# Patient Record
Sex: Female | Born: 1990 | Race: White | Hispanic: No | Marital: Married | Smoking: Former smoker
Health system: Southern US, Community
[De-identification: ages and names within clinical notes are randomized; demographics above are authoritative.]

## PROBLEM LIST (undated history)

## (undated) ENCOUNTER — Inpatient Hospital Stay (HOSPITAL_COMMUNITY): Payer: Self-pay

## (undated) DIAGNOSIS — K259 Gastric ulcer, unspecified as acute or chronic, without hemorrhage or perforation: Secondary | ICD-10-CM

## (undated) DIAGNOSIS — S069XAA Unspecified intracranial injury with loss of consciousness status unknown, initial encounter: Secondary | ICD-10-CM

## (undated) DIAGNOSIS — J45909 Unspecified asthma, uncomplicated: Secondary | ICD-10-CM

## (undated) DIAGNOSIS — G43909 Migraine, unspecified, not intractable, without status migrainosus: Secondary | ICD-10-CM

## (undated) HISTORY — PX: EXTERNAL EAR SURGERY: SHX627

## (undated) HISTORY — PX: EYE MUSCLE SURGERY: SHX370

## (undated) HISTORY — PX: EYE SURGERY: SHX253

---
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
## 2012-05-24 ENCOUNTER — Emergency Department (HOSPITAL_COMMUNITY)
Admission: EM | Admit: 2012-05-24 | Discharge: 2012-05-24 | Disposition: A | Discharge status: Home / Self Care | Payer: Managed Care, Other (non HMO) | Attending: Emergency Medicine | Admitting: Emergency Medicine

## 2012-05-24 ENCOUNTER — Encounter (HOSPITAL_COMMUNITY): Payer: Self-pay | Admitting: Emergency Medicine

## 2012-05-24 ENCOUNTER — Emergency Department (HOSPITAL_COMMUNITY): Payer: Managed Care, Other (non HMO)

## 2012-05-24 DIAGNOSIS — R062 Wheezing: Secondary | ICD-10-CM | POA: Insufficient documentation

## 2012-05-24 DIAGNOSIS — R05 Cough: Secondary | ICD-10-CM | POA: Insufficient documentation

## 2012-05-24 DIAGNOSIS — Z87891 Personal history of nicotine dependence: Secondary | ICD-10-CM | POA: Insufficient documentation

## 2012-05-24 DIAGNOSIS — N912 Amenorrhea, unspecified: Secondary | ICD-10-CM | POA: Insufficient documentation

## 2012-05-24 DIAGNOSIS — J40 Bronchitis, not specified as acute or chronic: Secondary | ICD-10-CM | POA: Insufficient documentation

## 2012-05-24 DIAGNOSIS — R109 Unspecified abdominal pain: Secondary | ICD-10-CM | POA: Insufficient documentation

## 2012-05-24 DIAGNOSIS — R059 Cough, unspecified: Secondary | ICD-10-CM | POA: Insufficient documentation

## 2012-05-24 LAB — URINALYSIS, ROUTINE W REFLEX MICROSCOPIC
Bilirubin Urine: NEGATIVE
Glucose, UA: NEGATIVE mg/dL
Ketones, ur: NEGATIVE mg/dL
Leukocytes, UA: NEGATIVE
Nitrite: NEGATIVE
Protein, ur: NEGATIVE mg/dL
Specific Gravity, Urine: 1.021 (ref 1.005–1.030)
Urobilinogen, UA: 1 mg/dL (ref 0.0–1.0)
pH: 6 (ref 5.0–8.0)

## 2012-05-24 LAB — POCT PREGNANCY, URINE: Preg Test, Ur: NEGATIVE

## 2012-05-24 MED ORDER — ALBUTEROL SULFATE HFA 108 (90 BASE) MCG/ACT IN AERS
2.0000 | INHALATION_SPRAY | Freq: Once | RESPIRATORY_TRACT | Status: AC
  Administered 2012-05-24: 2 via RESPIRATORY_TRACT
  Filled 2012-05-24: qty 6.7

## 2012-05-24 MED ORDER — PREDNISONE 20 MG PO TABS: 40.0000 mg | ORAL_TABLET | Freq: Every day | ORAL | Status: DC

## 2012-05-24 MED ORDER — NAPROXEN 375 MG PO TABS: 375.0000 mg | ORAL_TABLET | Freq: Two times a day (BID) | ORAL | Status: DC

## 2012-05-24 NOTE — ED Notes (Signed)
Pt states that she her last period was 04/08/12.  States that she has been having trouble sleeping and thinks she may be pregnant.  Has not taken a home pregnancy test.

## 2012-05-24 NOTE — ED Provider Notes (Signed)
History     CSN: [id number]  Arrival date & time 05/24/12  1044   First MD Initiated Contact with Patient 05/24/12 1146      Chief Complaint  Patient presents with  . Possible Pregnancy    (Consider location/radiation/quality/duration/timing/severity/associated sxs/prior treatment) HPI Melanie Galloway is a 21 y.o. female who presents with complaint of left flank pain, cough, wheezing, missed a period this month. States she may be pregnant. States abdominal pain for 2 months. Flank pain for about 2 weeks. Pt states she does not have a PCP. States no fever, chills, no dysuria, no vaginal complaints such as bleeding or discharge. States pain worsened when laying down. Better when sitting up. No other complaints.   History reviewed. No pertinent past medical history.  Past Surgical History  Procedure Date  . Eye muscle surgery   . External ear surgery     History reviewed. No pertinent family history.  History  Substance Use Topics  . Smoking status: Former Games developer  . Smokeless tobacco: Not on file  . Alcohol Use: No    OB History    Grav Para Term Preterm Abortions TAB SAB Ect Mult Living                  Review of Systems  Constitutional: Negative for fever and chills.  HENT: Negative for neck pain and neck stiffness.   Respiratory: Positive for cough and wheezing. Negative for chest tightness.   Cardiovascular: Negative for chest pain.  Gastrointestinal: Negative for nausea, vomiting, abdominal pain and diarrhea.  Genitourinary: Positive for flank pain. Negative for dysuria, urgency, frequency, hematuria, vaginal bleeding and vaginal discharge.  Musculoskeletal: Negative.   Skin: Negative.   Neurological: Negative for dizziness, weakness and headaches.    Allergies  Review of patient's allergies indicates no known allergies.  Home Medications  No current outpatient prescriptions on file.  BP 103/59  Pulse 67  Temp 98.4 F (36.9 C) (Oral)  Resp 18  SpO2  100%  LMP 04/08/2012  Physical Exam  Nursing note and vitals reviewed. Constitutional: She is oriented to person, place, and time. She appears well-developed and well-nourished. No distress.  HENT:  Head: Normocephalic.  Eyes: Conjunctivae normal are normal.  Neck: Neck supple.  Cardiovascular: Normal rate, regular rhythm and normal heart sounds.   Pulmonary/Chest: Effort normal. She has wheezes. She has no rales.       Left upper back tenderness to palpation  Abdominal: Soft. Bowel sounds are normal. She exhibits no distension. There is no tenderness. There is no rebound and no guarding.       No CVA tenderness  Musculoskeletal: She exhibits no edema.  Neurological: She is alert and oriented to person, place, and time.  Skin: Skin is warm and dry.    ED Course  Procedures (including critical care time)  Pt with left flank pain, cough, thinks she may be pregnant. Pt non toxic appearing. Will get UA, CXR.   Results for orders placed during the hospital encounter of 05/24/12  POCT PREGNANCY, URINE      Component Value Range   Preg Test, Ur NEGATIVE  NEGATIVE  URINALYSIS, ROUTINE W REFLEX MICROSCOPIC      Component Value Range   Color, Urine YELLOW  YELLOW   APPearance CLEAR  CLEAR   Specific Gravity, Urine 1.021  1.005 - 1.030   pH 6.0  5.0 - 8.0   Glucose, UA NEGATIVE  NEGATIVE mg/dL   Hgb urine dipstick MODERATE (*) NEGATIVE  Bilirubin Urine NEGATIVE  NEGATIVE   Ketones, ur NEGATIVE  NEGATIVE mg/dL   Protein, ur NEGATIVE  NEGATIVE mg/dL   Urobilinogen, UA 1.0  0.0 - 1.0 mg/dL   Nitrite NEGATIVE  NEGATIVE   Leukocytes, UA NEGATIVE  NEGATIVE  URINE MICROSCOPIC-ADD ON      Component Value Range   Squamous Epithelial / LPF FEW (*) RARE   RBC / HPF 0-2  <3 RBC/hpf   Bacteria, UA RARE  RARE   Dg Chest 2 View  05/24/2012  *RADIOLOGY REPORT*  Clinical Data:  Pain  CHEST - 2 VIEW  Comparison: None.  Findings: The lungs are clear.  The heart size and pulmonary vascularity  are normal.  No adenopathy.  No bone lesions.  IMPRESSION: No abnormality noted.   Original Report Authenticated By: Bretta Bang, M.D.      1. Bronchitis   2. Flank pain       MDM  Pt with left flank pain, cough. She is afebrile, non toxic. CXR and UA negative. Pt not pregnant. Abdomen benign, non tender. Pt does have some wheezing on lung exam. WIll tx with albuterol inhaler, will give a course of prednisone. Suspect pain is due to muscle strain for coughing vs bronchitis. Vs normal.   Filed Vitals:   05/24/12 1312  BP: 112/63  Pulse: 64  Temp: 97.9 F (36.6 C)  Resp: [id number] A Emmet Messer, PA 05/24/12 1553

## 2012-05-24 NOTE — ED Notes (Signed)
Patient transported to X-ray 

## 2012-05-25 NOTE — ED Provider Notes (Signed)
Medical screening examination/treatment/procedure(s) were performed by non-physician practitioner and as supervising physician I was immediately available for consultation/collaboration.   Vickye Astorino, MD 05/25/12 0725 

## 2012-06-29 ENCOUNTER — Encounter: Payer: Self-pay | Admitting: Obstetrics and Gynecology

## 2012-09-15 ENCOUNTER — Encounter (HOSPITAL_COMMUNITY): Payer: Self-pay | Admitting: Emergency Medicine

## 2012-09-15 ENCOUNTER — Emergency Department (HOSPITAL_COMMUNITY)
Admission: EM | Admit: 2012-09-15 | Discharge: 2012-09-15 | Disposition: A | Discharge status: Home / Self Care | Payer: Self-pay | Attending: Emergency Medicine | Admitting: Emergency Medicine

## 2012-09-15 ENCOUNTER — Other Ambulatory Visit: Payer: Self-pay

## 2012-09-15 DIAGNOSIS — R1013 Epigastric pain: Secondary | ICD-10-CM

## 2012-09-15 DIAGNOSIS — R109 Unspecified abdominal pain: Secondary | ICD-10-CM | POA: Insufficient documentation

## 2012-09-15 DIAGNOSIS — Z87891 Personal history of nicotine dependence: Secondary | ICD-10-CM | POA: Insufficient documentation

## 2012-09-15 DIAGNOSIS — Z79899 Other long term (current) drug therapy: Secondary | ICD-10-CM | POA: Insufficient documentation

## 2012-09-15 DIAGNOSIS — R0602 Shortness of breath: Secondary | ICD-10-CM | POA: Insufficient documentation

## 2012-09-15 DIAGNOSIS — G8929 Other chronic pain: Secondary | ICD-10-CM | POA: Insufficient documentation

## 2012-09-15 DIAGNOSIS — R51 Headache: Secondary | ICD-10-CM | POA: Insufficient documentation

## 2012-09-15 LAB — URINALYSIS, ROUTINE W REFLEX MICROSCOPIC
Glucose, UA: NEGATIVE mg/dL
Ketones, ur: NEGATIVE mg/dL
Leukocytes, UA: NEGATIVE
Nitrite: NEGATIVE
Protein, ur: NEGATIVE mg/dL

## 2012-09-15 LAB — COMPREHENSIVE METABOLIC PANEL
AST: 19 U/L (ref 0–37)
Albumin: 4.6 g/dL (ref 3.5–5.2)
BUN: 8 mg/dL (ref 6–23)
CO2: 23 mEq/L (ref 19–32)
Calcium: 10 mg/dL (ref 8.4–10.5)
Creatinine, Ser: 1.02 mg/dL (ref 0.50–1.10)
GFR calc non Af Amer: 79 mL/min — ABNORMAL LOW (ref >90)
Total Bilirubin: 0.4 mg/dL (ref 0.3–1.2)

## 2012-09-15 LAB — CBC WITH DIFFERENTIAL/PLATELET
Basophils Absolute: 0 10*3/uL (ref 0.0–0.1)
Basophils Relative: 1 % (ref 0–1)
Eosinophils Relative: 1 % (ref 0–5)
HCT: 45.4 % (ref 36.0–46.0)
Hemoglobin: 15.9 g/dL — ABNORMAL HIGH (ref 12.0–15.0)
MCHC: 35 g/dL (ref 30.0–36.0)
MCV: 87.8 fL (ref 78.0–100.0)
Monocytes Absolute: 0.6 10*3/uL (ref 0.1–1.0)
Monocytes Relative: 7 % (ref 3–12)
RDW: 12.6 % (ref 11.5–15.5)

## 2012-09-15 LAB — SEDIMENTATION RATE: Sed Rate: 4 mm/hr (ref 0–22)

## 2012-09-15 LAB — LIPASE, BLOOD: Lipase: 25 U/L (ref 11–59)

## 2012-09-15 MED ORDER — FENTANYL CITRATE 0.05 MG/ML IJ SOLN
100.0000 ug | Freq: Once | INTRAMUSCULAR | Status: AC
  Administered 2012-09-15: 100 ug via INTRAVENOUS
  Filled 2012-09-15: qty 2

## 2012-09-15 MED ORDER — OMEPRAZOLE 20 MG PO CPDR: 20.0000 mg | DELAYED_RELEASE_CAPSULE | Freq: Every day | ORAL | Status: DC

## 2012-09-15 MED ORDER — PROCHLORPERAZINE MALEATE 10 MG PO TABS: 10.0000 mg | ORAL_TABLET | Freq: Three times a day (TID) | ORAL | Status: DC | PRN

## 2012-09-15 MED ORDER — TRAMADOL HCL 50 MG PO TABS: 50.0000 mg | ORAL_TABLET | Freq: Four times a day (QID) | ORAL | Status: DC | PRN

## 2012-09-15 NOTE — ED Provider Notes (Signed)
History     CSN: [id number]  Arrival date & time 09/15/12  1351   First MD Initiated Contact with Patient 09/15/12 1515      Chief Complaint  Patient presents with  . Headache  . Abdominal Pain  . Shortness of Breath    (Consider location/radiation/quality/duration/timing/severity/associated sxs/prior treatment) HPI 22 yo otherwise healthy female presenting with headache x4 months worse over the last 3 days and epigastric pain x2 days.  The headache is located right posterior parietal and described as pressure, like "someone is trying to get out of my skull."  Denies photophobia, phonophobia, nausea, focal weakness.  States headache is worse when she lies down for bed, not particularly betting with upright positions.  Denies any trauma.  For the last 3 days the headache has been preventing her from sleeping.  Has tried tylenol and ibuprofen without improvement.  The abdominal pain is also chronic, but worse since yesterday.  Reports being unable to stand upright due to the pain yesterday morning.  Pain is located epigastrically and worse when she lies on her stomach.  Has had decreased appetite due to the pain and reports 14lb weight loss in the last 2 months.  No nausea or vomiting.  No diarrhea or constipation.  Denies blood in stool and dark tarry stools.  Denies any association with food.  Denies frequent use of ibuprofen or aspirin.  Smokes 1/2ppd, drinks alcohol on the weekends (2-3 shots or 3-4 beers).  Reports family history of gastric cancer and Crohn's disease.  History reviewed. No pertinent past medical history.  Past Surgical History  Procedure Laterality Date  . Eye muscle surgery    . External ear surgery      No family history on file.  History  Substance Use Topics  . Smoking status: Former Games developer  . Smokeless tobacco: Not on file  . Alcohol Use: No    OB History   Grav Para Term Preterm Abortions TAB SAB Ect Mult Living                  Review of Systems    Constitutional: Positive for appetite change. Negative for fever and chills.  Eyes: Negative.   Respiratory: Negative.   Cardiovascular: Negative.   Gastrointestinal: Positive for abdominal pain. Negative for nausea, vomiting, diarrhea, constipation and blood in stool.  Genitourinary: Negative.   Musculoskeletal: Negative.   Skin: Negative.   Neurological: Positive for headaches. Negative for dizziness, weakness, light-headedness and numbness.    Allergies  Review of patient's allergies indicates no known allergies.  Home Medications   Current Outpatient Rx  Name  Route  Sig  Dispense  Refill  . albuterol (PROVENTIL HFA;VENTOLIN HFA) 108 (90 BASE) MCG/ACT inhaler   Inhalation   Inhale 2 puffs into the lungs every 6 (six) hours as needed for wheezing.         Marland Kitchen ibuprofen (ADVIL,MOTRIN) 200 MG tablet   Oral   Take 200 mg by mouth every 6 (six) hours as needed for pain.           BP 115/81  Pulse 96  Temp(Src) 98.7 F (37.1 C) (Oral)  Resp 14  SpO2 96%  Physical Exam  Constitutional: She is oriented to person, place, and time. She appears well-developed and well-nourished. No distress.  HENT:  Head: Normocephalic and atraumatic.    Right Ear: External ear normal.  Left Ear: External ear normal.  Mouth/Throat: Oropharynx is clear and moist. No oropharyngeal exudate.  Eyes: EOM  are normal. Pupils are equal, round, and reactive to light. Right eye exhibits no discharge. Left eye exhibits no discharge. No scleral icterus.  sclera injection  Neck: Normal range of motion. Neck supple. No tracheal deviation present.  Cardiovascular: Normal rate, regular rhythm, normal heart sounds and intact distal pulses.   No murmur heard. Pulmonary/Chest: Effort normal and breath sounds normal. No respiratory distress. She has no wheezes. She has no rales.  Abdominal: Soft. Bowel sounds are normal. She exhibits no distension and no mass. There is tenderness (epigastric and RUQ, and  mild suprapubic). There is no rebound, no guarding and no CVA tenderness.  Musculoskeletal: She exhibits no edema and no tenderness.  Lymphadenopathy:    She has no cervical adenopathy.  Neurological: She is alert and oriented to person, place, and time. She has normal reflexes. She displays normal reflexes. No cranial nerve deficit. She exhibits normal muscle tone. Coordination normal.  Skin: Skin is warm and dry. No rash noted. She is not diaphoretic. No erythema.  Psychiatric: She has a normal mood and affect.    ED Course  Procedures (including critical care time)  Labs Reviewed  CBC WITH DIFFERENTIAL - Abnormal; Notable for the following:    RBC 5.17 (*)    Hemoglobin 15.9 (*)    All other components within normal limits  COMPREHENSIVE METABOLIC PANEL - Abnormal; Notable for the following:    GFR calc non Af Amer 79 (*)    All other components within normal limits  LIPASE, BLOOD  URINALYSIS, ROUTINE W REFLEX MICROSCOPIC  SEDIMENTATION RATE   No results found.   No diagnosis found.    MDM  22 yo F with chronic headache worse in last 3 days and chronic abdominal pain worse in last 2 days.   No focal neurologic deficits, non-surgical abdomen.  Location of abdominal pain concerning for gastritis or liver/gallbladder pathology.  Crohn's disease is also possible given family history, but is less likely with no history of diarrhea or blood in stool.  History most consistent with a tension headache although the specific locality of the pain is unusual.  Will check cbc, cmp, lipase, esr, ua.  Fentanyl x1 for pain.  4:14 PM: Report pain improved with fentanyl.  States that this is the first time ever that her eyes have been red.  No eye pain, itching or sensitivity to light.  5:35 PM: Labs are unremarkable.  Patient states pain improved, but still has pain in the epigastric region.  She is tender in the epigastric and along the left costal margin.  Will treat abdominal pain with  alcohol avoidance and omeprazole for presumed gastritis.  May also have component of costochondritis, but will avoid NSAIDs for now.  Headache does not have any red flags.  Will provide tramadol and compazine to use symptomatically.  Discussed results and treatment extensively with patient and she is agreeable with the plan.  Also discussed that she should find a primary doctor who can follow these issues over time.  Recommended that she find providers through her insurance company or [facility] if her insurance has lapsed.  Phebe Colla, MD 09/15/12 [phone]

## 2012-09-15 NOTE — ED Notes (Signed)
Pt c/o of severe headache x4 months, increased more past 72 hours. Also c/o of severe mid abd pain, unable to sleep on and very tender. States that she feels like she cant breathe. NAD at this time.

## 2012-09-15 NOTE — ED Provider Notes (Signed)
I saw and evaluated the patient, reviewed the resident's note and I agree with the findings and plan.   .Face to face Exam:  General:  Awake HEENT:  Atraumatic Resp:  Normal effort Abd:  Nondistended Neuro:No focal weakness Lymph: No adenopathy  Nelia Shi, MD 09/15/12 1821

## 2013-01-12 ENCOUNTER — Encounter (HOSPITAL_COMMUNITY): Payer: Self-pay

## 2013-01-12 ENCOUNTER — Emergency Department (HOSPITAL_COMMUNITY)
Admission: EM | Admit: 2013-01-12 | Discharge: 2013-01-12 | Disposition: A | Discharge status: Home / Self Care | Payer: Self-pay | Attending: Emergency Medicine | Admitting: Emergency Medicine

## 2013-01-12 DIAGNOSIS — N390 Urinary tract infection, site not specified: Secondary | ICD-10-CM | POA: Insufficient documentation

## 2013-01-12 DIAGNOSIS — Z8719 Personal history of other diseases of the digestive system: Secondary | ICD-10-CM | POA: Insufficient documentation

## 2013-01-12 DIAGNOSIS — Z3202 Encounter for pregnancy test, result negative: Secondary | ICD-10-CM | POA: Insufficient documentation

## 2013-01-12 DIAGNOSIS — Z87891 Personal history of nicotine dependence: Secondary | ICD-10-CM | POA: Insufficient documentation

## 2013-01-12 DIAGNOSIS — Z79899 Other long term (current) drug therapy: Secondary | ICD-10-CM | POA: Insufficient documentation

## 2013-01-12 DIAGNOSIS — Z791 Long term (current) use of non-steroidal anti-inflammatories (NSAID): Secondary | ICD-10-CM | POA: Insufficient documentation

## 2013-01-12 DIAGNOSIS — K2990 Gastroduodenitis, unspecified, without bleeding: Secondary | ICD-10-CM | POA: Insufficient documentation

## 2013-01-12 DIAGNOSIS — R1013 Epigastric pain: Secondary | ICD-10-CM | POA: Insufficient documentation

## 2013-01-12 DIAGNOSIS — R110 Nausea: Secondary | ICD-10-CM | POA: Insufficient documentation

## 2013-01-12 DIAGNOSIS — R339 Retention of urine, unspecified: Secondary | ICD-10-CM | POA: Insufficient documentation

## 2013-01-12 DIAGNOSIS — R109 Unspecified abdominal pain: Secondary | ICD-10-CM

## 2013-01-12 DIAGNOSIS — K2970 Gastritis, unspecified, without bleeding: Secondary | ICD-10-CM | POA: Insufficient documentation

## 2013-01-12 HISTORY — DX: Gastric ulcer, unspecified as acute or chronic, without hemorrhage or perforation: K25.9

## 2013-01-12 LAB — POCT PREGNANCY, URINE: Preg Test, Ur: NEGATIVE

## 2013-01-12 LAB — URINE MICROSCOPIC-ADD ON

## 2013-01-12 LAB — URINALYSIS, ROUTINE W REFLEX MICROSCOPIC
Bilirubin Urine: NEGATIVE
Ketones, ur: NEGATIVE mg/dL
Nitrite: POSITIVE — AB
Protein, ur: NEGATIVE mg/dL
Specific Gravity, Urine: 1.02 (ref 1.005–1.030)
Urobilinogen, UA: 0.2 mg/dL (ref 0.0–1.0)

## 2013-01-12 LAB — COMPREHENSIVE METABOLIC PANEL
ALT: 7 U/L (ref 0–35)
Albumin: 3.9 g/dL (ref 3.5–5.2)
Alkaline Phosphatase: 50 U/L (ref 39–117)
Calcium: 9.5 mg/dL (ref 8.4–10.5)
GFR calc Af Amer: >90 mL/min (ref >90)
Glucose, Bld: 106 mg/dL — ABNORMAL HIGH (ref 70–99)
Potassium: 3.6 mEq/L (ref 3.5–5.1)
Sodium: 138 mEq/L (ref 135–145)
Total Protein: 6.6 g/dL (ref 6.0–8.3)

## 2013-01-12 LAB — CBC WITH DIFFERENTIAL/PLATELET
Basophils Relative: 0 % (ref 0–1)
Eosinophils Absolute: 0.1 10*3/uL (ref 0.0–0.7)
Eosinophils Relative: 2 % (ref 0–5)
Lymphs Abs: 1.3 10*3/uL (ref 0.7–4.0)
MCH: 30.6 pg (ref 26.0–34.0)
MCHC: 34.3 g/dL (ref 30.0–36.0)
MCV: 89.2 fL (ref 78.0–100.0)
Neutrophils Relative %: 68 % (ref 43–77)
Platelets: 210 10*3/uL (ref 150–400)
RBC: 4.45 MIL/uL (ref 3.87–5.11)
RDW: 12.3 % (ref 11.5–15.5)

## 2013-01-12 LAB — OCCULT BLOOD, POC DEVICE: Fecal Occult Bld: NEGATIVE

## 2013-01-12 LAB — HCG, SERUM, QUALITATIVE: Preg, Serum: NEGATIVE

## 2013-01-12 MED ORDER — NITROFURANTOIN MONOHYD MACRO 100 MG PO CAPS
100.0000 mg | ORAL_CAPSULE | Freq: Once | ORAL | Status: AC
  Administered 2013-01-12: 100 mg via ORAL
  Filled 2013-01-12: qty 1

## 2013-01-12 MED ORDER — NITROFURANTOIN MONOHYD MACRO 100 MG PO CAPS: 100.0000 mg | ORAL_CAPSULE | Freq: Two times a day (BID) | ORAL | Status: DC

## 2013-01-12 MED ORDER — GI COCKTAIL ~~LOC~~
30.0000 mL | Freq: Once | ORAL | Status: AC
  Administered 2013-01-12: 30 mL via ORAL
  Filled 2013-01-12: qty 30

## 2013-01-12 MED ORDER — OMEPRAZOLE 20 MG PO CPDR: 40.0000 mg | DELAYED_RELEASE_CAPSULE | Freq: Every day | ORAL | Status: DC

## 2013-01-12 MED ORDER — SUCRALFATE 1 G PO TABS: 1.0000 g | ORAL_TABLET | Freq: Four times a day (QID) | ORAL | Status: DC

## 2013-01-12 NOTE — Progress Notes (Signed)
Patient confirms that she does not have a pcp.  Instructed patient to call the number on the back of her insurance card or go to insurance carrier website to help her find a doctor who is within network.  Patient verbalized understanding.

## 2013-01-12 NOTE — ED Notes (Signed)
[id number]<WG> Expected date:<BR> Expected time:<BR> Means of arrival:<BR> Comments:<BR> 22 yo, hip pain

## 2013-01-12 NOTE — ED Notes (Signed)
Admitted to hospital 2 months ago for stomach ulcer and later got discharge. Today c/o lower bilateral abdominal pain (worse on right side) since yesterday, bowel sound present, slight tender at palpation with painful urination.  Pain 8/10.

## 2013-01-12 NOTE — ED Provider Notes (Signed)
TIME SEEN: 2:30 PM  CHIEF COMPLAINT: Abdominal pain, urinary retention, nausea, bloody stool  HPI: Patient is a 22 year old female with a history of possible peptic ulcer disease who presents to the emergency department with complaints of worsening upper abdominal pain for the past 2 days. Patient reports that she has had epigastric abdominal pain intermittently for the past 6 months. She was seen in the emergency department and diagnosed with possible peptic ulcer disease. She has never undergone endoscopy. She states that she did have a fever of 100.8 last night. She has had nausea but no vomiting. No diarrhea or melena. She has had bright red blood per rectum, she reports mixed in her stool for the past 6 months. She denies any prior history of abdominal surgery. She is complaining also of urinary retention but no dysuria or hematuria. She states she does have a minimal amount of vaginal discharge which is normal for her. Her last menstrual period was between 5-6 weeks ago. She is sexually active. She reports she took a home pregnancy test which was negative.  ROS: See HPI Constitutional:  fever  Eyes: no drainage  ENT: no runny nose   Cardiovascular:  no chest pain  Resp: no SOB  GI: no vomiting GU: no dysuria Integumentary: no rash  Allergy: no hives  Musculoskeletal: no leg swelling  Neurological: no slurred speech ROS otherwise negative  PAST MEDICAL HISTORY/PAST SURGICAL HISTORY:  Past Medical History  Diagnosis Date  . Stomach ulcer     MEDICATIONS:  Prior to Admission medications   Medication Sig Start Date End Date Taking? Authorizing Provider  albuterol (PROVENTIL HFA;VENTOLIN HFA) 108 (90 BASE) MCG/ACT inhaler Inhale 2 puffs into the lungs every 6 (six) hours as needed for wheezing.   Yes Historical Provider, MD  ibuprofen (ADVIL,MOTRIN) 200 MG tablet Take 400 mg by mouth every 6 (six) hours as needed for pain.   Yes Historical Provider, MD  naproxen sodium (ANAPROX) 220  MG tablet Take 440 mg by mouth 2 (two) times daily with a meal.   Yes Historical Provider, MD  omeprazole (PRILOSEC) 20 MG capsule Take 1 capsule (20 mg total) by mouth daily. 09/15/12  Yes Phebe Colla, MD    ALLERGIES:  No Known Allergies  SOCIAL HISTORY:  History  Substance Use Topics  . Smoking status: Former Games developer  . Smokeless tobacco: Not on file  . Alcohol Use: No    FAMILY HISTORY: No family history on file.  EXAM: BP 93/73  Pulse 91  Temp(Src) 98.3 F (36.8 C) (Oral)  Resp 16  SpO2 96% CONSTITUTIONAL: Alert and oriented and responds appropriately to questions. Well-appearing; well-nourished, patient is nontoxic, well-hydrated, smiling and laughing during exam HEAD: Normocephalic EYES: Conjunctivae clear, PERRL ENT: normal nose; no rhinorrhea; moist mucous membranes; pharynx without lesions noted NECK: Supple, no meningismus, no LAD  CARD: RRR; S1 and S2 appreciated; no murmurs, no clicks, no rubs, no gallops RESP: Normal chest excursion without splinting or tachypnea; breath sounds clear and equal bilaterally; no wheezes, no rhonchi, no rales,  ABD/GI: Normal bowel sounds; non-distended; soft, mild tenderness to palpation of the suprapubic region and epigastrium, no rebound or guarding, no tenderness at McBurney's point, negative Murphy sign BACK:  The back appears normal and is non-tender to palpation, there is no CVA tenderness EXT: Normal ROM in all joints; non-tender to palpation; no edema; normal capillary refill; no cyanosis    SKIN: Normal color for age and race; warm NEURO: Moves all extremities equally PSYCH:  The patient's mood and manner are appropriate. Grooming and personal hygiene are appropriate.  MEDICAL DECISION MAKING: Patient with possible gastritis versus peptic ulcer disease.  Low suspicion for cholecystitis, pancreatitis. Patient does report her mother and father both have a history of Crohn's disease however her abdominal exam is very benign and I  feel this diagnosis is less likely. She has no tenderness at McBurney's point and no fever here in the emergency department. I feel appendicitis is also unlikely. Will obtain basic labs, abdominal labs, urinalysis, hCG. We'll give GI cocktail and reassess. Given her very benign abdominal exam, I do not feel she needs abdominal imaging at this time. Discussed this with patient and she verbalized understanding and agrees with plan.  ED PROGRESS:  3:14 AM  Pt has urinary tract infection. Urine culture pending. Will treat with Macrobid. Patient reports her pain is improved with GI cocktail. Labs pending.  3:40 PM  Labs are completely unremarkable. Patient reports feeling better. Given her exam is still benign, I do not feel she needs abdominal imaging. Discussed with patient who is in agreement. We'll give her PCP followup as she will likely need an endoscopy for her chronic epigastric pain and possible colonoscopy for her chronic mild GI bleeding and family history of Crohn's disease.  Given usual and customary return precautions.  Melanie Maw Delancey Moraes, DO 01/12/13 1541

## 2013-01-12 NOTE — Progress Notes (Signed)
[facility] provided patient with a list of primary care resources and a [facility] [employer] application, which will help get her set up with a pcp.

## 2013-01-15 LAB — URINE CULTURE

## 2013-01-16 NOTE — ED Notes (Signed)
Post ED Visit - Positive Culture Follow-up: Successful Patient Follow-Up  Culture assessed and recommendations reviewed by: []  Wes Dulaney, Pharm.D., BCPS [x]  Celedonio Miyamoto, Pharm.D., BCPS []  Georgina Pillion, Pharm.D., BCPS []  [place], [address].D., BCPS, AAHIVP []  Estella Husk, Pharm.D., BCPS, AAHIVP  Positive urine culture  []  Patient discharged without antimicrobial prescription and treatment is now indicated [x]  Organism is resistant to prescribed ED discharge antimicrobial []  Patient with positive blood cultures  Changes discussed with ED provider: Sharilyn Sites New antibiotic prescription Call patient to see if urinary symptoms improved. If not Bactrim DS 1 tablet BID x 3 days. No further treatment needed if she improved.   Larena Sox 01/16/2013, 6:41 PM

## 2013-01-17 ENCOUNTER — Telehealth (HOSPITAL_COMMUNITY): Payer: Self-pay | Admitting: *Deleted

## 2013-02-15 ENCOUNTER — Emergency Department (HOSPITAL_COMMUNITY): Payer: Managed Care, Other (non HMO)

## 2013-02-15 ENCOUNTER — Encounter (HOSPITAL_COMMUNITY): Payer: Self-pay | Admitting: *Deleted

## 2013-02-15 ENCOUNTER — Emergency Department (HOSPITAL_COMMUNITY)
Admission: EM | Admit: 2013-02-15 | Discharge: 2013-02-15 | Disposition: A | Discharge status: Home / Self Care | Payer: Self-pay | Attending: Emergency Medicine | Admitting: Emergency Medicine

## 2013-02-15 DIAGNOSIS — Z8711 Personal history of peptic ulcer disease: Secondary | ICD-10-CM | POA: Insufficient documentation

## 2013-02-15 DIAGNOSIS — Z79899 Other long term (current) drug therapy: Secondary | ICD-10-CM | POA: Insufficient documentation

## 2013-02-15 DIAGNOSIS — N390 Urinary tract infection, site not specified: Secondary | ICD-10-CM | POA: Insufficient documentation

## 2013-02-15 DIAGNOSIS — Z87891 Personal history of nicotine dependence: Secondary | ICD-10-CM | POA: Insufficient documentation

## 2013-02-15 DIAGNOSIS — Z3202 Encounter for pregnancy test, result negative: Secondary | ICD-10-CM | POA: Insufficient documentation

## 2013-02-15 DIAGNOSIS — R300 Dysuria: Secondary | ICD-10-CM | POA: Insufficient documentation

## 2013-02-15 DIAGNOSIS — R350 Frequency of micturition: Secondary | ICD-10-CM | POA: Insufficient documentation

## 2013-02-15 LAB — URINALYSIS, ROUTINE W REFLEX MICROSCOPIC
Bilirubin Urine: NEGATIVE
Specific Gravity, Urine: 1.025 (ref 1.005–1.030)
pH: 6 (ref 5.0–8.0)

## 2013-02-15 LAB — CBC
HCT: 40.8 % (ref 36.0–46.0)
Hemoglobin: 14 g/dL (ref 12.0–15.0)
MCV: 89.9 fL (ref 78.0–100.0)
RBC: 4.54 MIL/uL (ref 3.87–5.11)
WBC: 9.5 10*3/uL (ref 4.0–10.5)

## 2013-02-15 LAB — BASIC METABOLIC PANEL
BUN: 9 mg/dL (ref 6–23)
CO2: 28 mEq/L (ref 19–32)
Chloride: 103 mEq/L (ref 96–112)
Creatinine, Ser: 0.79 mg/dL (ref 0.50–1.10)
Glucose, Bld: 100 mg/dL — ABNORMAL HIGH (ref 70–99)

## 2013-02-15 LAB — URINE MICROSCOPIC-ADD ON

## 2013-02-15 MED ORDER — ONDANSETRON HCL 4 MG/2ML IJ SOLN
4.0000 mg | Freq: Once | INTRAMUSCULAR | Status: AC
  Administered 2013-02-15: 4 mg via INTRAVENOUS
  Filled 2013-02-15: qty 2

## 2013-02-15 MED ORDER — PHENAZOPYRIDINE HCL 200 MG PO TABS: 200.0000 mg | ORAL_TABLET | Freq: Three times a day (TID) | ORAL | Status: DC

## 2013-02-15 MED ORDER — OXYCODONE-ACETAMINOPHEN 5-325 MG PO TABS
1.0000 | ORAL_TABLET | Freq: Once | ORAL | Status: AC
  Administered 2013-02-15: 1 via ORAL
  Filled 2013-02-15: qty 1

## 2013-02-15 MED ORDER — ONDANSETRON 8 MG PO TBDP: 8.0000 mg | ORAL_TABLET | Freq: Three times a day (TID) | ORAL | Status: DC | PRN

## 2013-02-15 MED ORDER — CEPHALEXIN 500 MG PO CAPS: 500.0000 mg | ORAL_CAPSULE | Freq: Four times a day (QID) | ORAL | Status: DC

## 2013-02-15 MED ORDER — DEXTROSE 5 % IV SOLN
1.0000 g | Freq: Once | INTRAVENOUS | Status: AC
  Administered 2013-02-15: 1 g via INTRAVENOUS
  Filled 2013-02-15: qty 10

## 2013-02-15 MED ORDER — SODIUM CHLORIDE 0.9 % IV SOLN
1000.0000 mL | Freq: Once | INTRAVENOUS | Status: AC
  Administered 2013-02-15: 1000 mL via INTRAVENOUS

## 2013-02-15 MED ORDER — SODIUM CHLORIDE 0.9 % IV SOLN
1000.0000 mL | INTRAVENOUS | Status: DC
  Administered 2013-02-15: 1000 mL via INTRAVENOUS

## 2013-02-15 NOTE — ED Provider Notes (Signed)
CSN: [id number]     Arrival date & time 02/15/13  4696 History   First MD Initiated Contact with Patient 02/15/13 0532     Chief Complaint  Patient presents with  . Hematuria   HPI Patient reports difficulty voiding over the past several days.  She's had some dysuria and some urinary frequency.  She reports developing new hematuria this morning.  She's also had nausea and vomiting most mornings over the past 7-10 days.  She's never had hematuria before.  No history of kidney stones.  She denies fevers and chills.  She denies upper abdominal pain but does report mild tenderness or suprapubic region.  Her symptoms are mild to moderate in severity  Past Medical History  Diagnosis Date  . Stomach ulcer    Past Surgical History  Procedure Laterality Date  . Eye muscle surgery    . External ear surgery     No family history on file. History  Substance Use Topics  . Smoking status: Former Games developer  . Smokeless tobacco: Not on file  . Alcohol Use: No   OB History   Grav Para Term Preterm Abortions TAB SAB Ect Mult Living                 Review of Systems  All other systems reviewed and are negative.    Allergies  Review of patient's allergies indicates no known allergies.  Home Medications   Current Outpatient Rx  Name  Route  Sig  Dispense  Refill  . omeprazole (PRILOSEC) 20 MG capsule   Oral   Take 20 mg by mouth daily as needed. For acid reflux         . albuterol (PROVENTIL HFA;VENTOLIN HFA) 108 (90 BASE) MCG/ACT inhaler   Inhalation   Inhale 2 puffs into the lungs every 6 (six) hours as needed for wheezing.         . cephALEXin (KEFLEX) 500 MG capsule   Oral   Take 1 capsule (500 mg total) by mouth 4 (four) times daily.   20 capsule   0   . ondansetron (ZOFRAN ODT) 8 MG disintegrating tablet   Oral   Take 1 tablet (8 mg total) by mouth every 8 (eight) hours as needed for nausea.   10 tablet   0   . phenazopyridine (PYRIDIUM) 200 MG tablet   Oral   Take 1  tablet (200 mg total) by mouth 3 (three) times daily.   6 tablet   0    BP 124/75  Pulse 94  Temp(Src) 97.8 F (36.6 C) (Oral)  Resp 20  SpO2 98%  LMP 02/01/2013 Physical Exam  Nursing note and vitals reviewed. Constitutional: She is oriented to person, place, and time. She appears well-developed and well-nourished. No distress.  HENT:  Head: Normocephalic and atraumatic.  Eyes: EOM are normal.  Neck: Normal range of motion.  Cardiovascular: Normal rate, regular rhythm and normal heart sounds.   Pulmonary/Chest: Effort normal and breath sounds normal.  Abdominal: Soft. She exhibits no distension. There is no tenderness.  Musculoskeletal: Normal range of motion.  Neurological: She is alert and oriented to person, place, and time.  Skin: Skin is warm and dry.  Psychiatric: She has a normal mood and affect. Judgment normal.    ED Course  Procedures (including critical care time) Labs Review Labs Reviewed  URINALYSIS, ROUTINE W REFLEX MICROSCOPIC - Abnormal; Notable for the following:    APPearance CLOUDY (*)    Hgb urine dipstick  LARGE (*)    Protein, ur >300 (*)    Leukocytes, UA SMALL (*)    All other components within normal limits  BASIC METABOLIC PANEL - Abnormal; Notable for the following:    Glucose, Bld 100 (*)    All other components within normal limits  PREGNANCY, URINE  CBC  URINE MICROSCOPIC-ADD ON   Imaging Review Ct Abdomen Pelvis Wo Contrast  02/15/2013   *RADIOLOGY REPORT*  Clinical Data: Lower abdominal pain, dysuria, hematuria, vomiting  CT ABDOMEN AND PELVIS WITHOUT CONTRAST  Technique:  Multidetector CT imaging of the abdomen and pelvis was performed following the standard protocol without intravenous contrast.  Comparison: None.  Findings: The lung bases are clear.  The liver is unremarkable in the unenhanced state.  No calcified gallstones are noted.  The pancreas is normal in size of this unenhanced study with no abnormality noted.  The adrenal  glands and spleen are unremarkable. The stomach is not well distended.  No renal calculi are seen and there is no present evidence of hydronephrosis.  The proximal ureters are not dilated.  The abdominal aorta is normal in caliber. No adenopathy is seen on this unenhanced study.  The urinary bladder is minimally distended and slightly thick- walled.  Is 98 suspicion of cystitis clinically.  The uterus is normal in size and there do appear to be small follicles bilaterally.  Only a tiny amount of free fluid is noted in the pelvis.  No abnormality of the colon is seen.  The appendix fills with air, and the terminal ileum is unremarkable.  The lumbar vertebrae are in normal alignment with normal disc spaces.  IMPRESSION:  1.  No explanation for hematuria is seen.  No renal or ureteral calculi are noted. 2.  Slightly thick-walled urinary bladder.  Possible cystitis. Correlate clinically. 3.  Tiny amount of free fluid in the pelvis with bilateral ovarian follicles noted. 4.  The appendix and terminal ileum are unremarkable.   Original Report Authenticated By: Dwyane Dee, M.D.   I personally reviewed the imaging tests through PACS system I reviewed available ER/hospitalization records through the EMR   MDM   1. Urinary tract infection    Patient feels much better at this time.  She was able to completely void and empty her bladder.  Bedside ultrasound demonstrates decompressed bladder.  Patient will receive dose of Rocephin in the emergency department.  Home with Keflex.  This likely represents hemorrhagic cystitis.  Not convinced that the morning episodes of vomiting are suggestive of pyelonephritis.  She's had no fevers or chills.  Her white blood cell count is normal.  Discharge home in good condition.  She understands return to the ER for new or worsening symptoms    Lyanne Co, MD 02/15/13 [phone]

## 2013-02-15 NOTE — ED Notes (Signed)
Pt states having trouble voiding for a few days; hematuria this morning; throwing up x 13 days

## 2013-02-15 NOTE — ED Notes (Signed)
Pt stated that she had a small swollen area 2 inch diameter on lower left abdominal area that disappeared on Tuesday.

## 2013-04-10 ENCOUNTER — Emergency Department (HOSPITAL_COMMUNITY)
Admission: EM | Admit: 2013-04-10 | Discharge: 2013-04-11 | Disposition: A | Discharge status: Home / Self Care | Payer: Self-pay | Attending: Emergency Medicine | Admitting: Emergency Medicine

## 2013-04-10 ENCOUNTER — Emergency Department (HOSPITAL_COMMUNITY): Payer: Self-pay

## 2013-04-10 DIAGNOSIS — N898 Other specified noninflammatory disorders of vagina: Secondary | ICD-10-CM | POA: Insufficient documentation

## 2013-04-10 DIAGNOSIS — R109 Unspecified abdominal pain: Secondary | ICD-10-CM | POA: Insufficient documentation

## 2013-04-10 DIAGNOSIS — B9689 Other specified bacterial agents as the cause of diseases classified elsewhere: Secondary | ICD-10-CM

## 2013-04-10 DIAGNOSIS — Z87891 Personal history of nicotine dependence: Secondary | ICD-10-CM | POA: Insufficient documentation

## 2013-04-10 DIAGNOSIS — Z8719 Personal history of other diseases of the digestive system: Secondary | ICD-10-CM | POA: Insufficient documentation

## 2013-04-10 DIAGNOSIS — O2390 Unspecified genitourinary tract infection in pregnancy, unspecified trimester: Secondary | ICD-10-CM | POA: Insufficient documentation

## 2013-04-10 DIAGNOSIS — O9989 Other specified diseases and conditions complicating pregnancy, childbirth and the puerperium: Secondary | ICD-10-CM | POA: Insufficient documentation

## 2013-04-10 DIAGNOSIS — N760 Acute vaginitis: Secondary | ICD-10-CM | POA: Insufficient documentation

## 2013-04-10 DIAGNOSIS — Z79899 Other long term (current) drug therapy: Secondary | ICD-10-CM | POA: Insufficient documentation

## 2013-04-10 DIAGNOSIS — Z3490 Encounter for supervision of normal pregnancy, unspecified, unspecified trimester: Secondary | ICD-10-CM

## 2013-04-10 LAB — URINALYSIS, ROUTINE W REFLEX MICROSCOPIC
Bilirubin Urine: NEGATIVE
Glucose, UA: NEGATIVE mg/dL
Hgb urine dipstick: NEGATIVE
Ketones, ur: NEGATIVE mg/dL
Nitrite: NEGATIVE
Protein, ur: NEGATIVE mg/dL
Specific Gravity, Urine: 1.023 (ref 1.005–1.030)
Urobilinogen, UA: 0.2 mg/dL (ref 0.0–1.0)
pH: 5.5 (ref 5.0–8.0)

## 2013-04-10 LAB — WET PREP, GENITAL
Trich, Wet Prep: NONE SEEN
WBC, Wet Prep HPF POC: NONE SEEN
Yeast Wet Prep HPF POC: NONE SEEN

## 2013-04-10 LAB — URINE MICROSCOPIC-ADD ON

## 2013-04-10 LAB — POCT PREGNANCY, URINE: Preg Test, Ur: POSITIVE — AB

## 2013-04-10 NOTE — ED Notes (Signed)
Pt states that for the past 2 weeks she has had blood in her urine and it painful to urinate. Also notes stomach cramps and more than normal vaginal discharge.

## 2013-04-11 LAB — GC/CHLAMYDIA PROBE AMP
CT Probe RNA: NEGATIVE
GC Probe RNA: NEGATIVE

## 2013-04-11 MED ORDER — METRONIDAZOLE 500 MG PO TABS: 500.0000 mg | ORAL_TABLET | Freq: Two times a day (BID) | ORAL | Status: DC

## 2013-04-11 NOTE — ED Provider Notes (Signed)
CSN: [id number]     Arrival date & time 04/10/13  2116 History   First MD Initiated Contact with Patient 04/10/13 2154     Chief Complaint  Patient presents with  . Dysuria  . Hematuria   (Consider location/radiation/quality/duration/timing/severity/associated sxs/prior Treatment) HPI Comments: Pt comes in with cc of left sided abd pain, blood in the urine and some pain with urination. Pt also states that her home pregnancy test is positive. Pt has never been pregnant before, and states that for the past 2 weeks she has been having left sided flak pain, with morning nausea. No vaginal bleeding and no new discharge. No hx of STDs.  Patient is a 22 y.o. female presenting with dysuria and hematuria. The history is provided by the patient.  Dysuria Associated symptoms: nausea   Associated symptoms: no abdominal pain, no vaginal discharge and no vomiting   Hematuria Pertinent negatives include no chest pain, no abdominal pain, no headaches and no shortness of breath.    Past Medical History  Diagnosis Date  . Stomach ulcer    Past Surgical History  Procedure Laterality Date  . Eye muscle surgery    . External ear surgery     No family history on file. History  Substance Use Topics  . Smoking status: Former Games developer  . Smokeless tobacco: Not on file  . Alcohol Use: No   OB History   Grav Para Term Preterm Abortions TAB SAB Ect Mult Living                 Review of Systems  Constitutional: Negative for activity change.  HENT: Negative for facial swelling.   Respiratory: Negative for cough, shortness of breath and wheezing.   Cardiovascular: Negative for chest pain.  Gastrointestinal: Positive for nausea. Negative for vomiting, abdominal pain, diarrhea, constipation, blood in stool and abdominal distention.  Genitourinary: Positive for dysuria and hematuria. Negative for vaginal bleeding, vaginal discharge and difficulty urinating.  Musculoskeletal: Negative for neck pain.   Skin: Negative for color change.  Neurological: Negative for speech difficulty and headaches.  Hematological: Does not bruise/bleed easily.  Psychiatric/Behavioral: Negative for confusion.    Allergies  Review of patient's allergies indicates no known allergies.  Home Medications   Current Outpatient Rx  Name  Route  Sig  Dispense  Refill  . albuterol (PROVENTIL HFA;VENTOLIN HFA) 108 (90 BASE) MCG/ACT inhaler   Inhalation   Inhale 2 puffs into the lungs every 6 (six) hours as needed for wheezing.         . metroNIDAZOLE (FLAGYL) 500 MG tablet   Oral   Take 1 tablet (500 mg total) by mouth 2 (two) times daily.   14 tablet   0    BP 113/77  Pulse 86  Temp(Src) 98.3 F (36.8 C) (Oral)  Resp 18  SpO2 99%  LMP 02/25/2013 Physical Exam  Nursing note and vitals reviewed. Constitutional: She is oriented to person, place, and time. She appears well-developed and well-nourished.  HENT:  Head: Normocephalic and atraumatic.  Eyes: Conjunctivae and EOM are normal. Pupils are equal, round, and reactive to light.  Neck: Normal range of motion. Neck supple.  Cardiovascular: Normal rate, regular rhythm, normal heart sounds and intact distal pulses.   No murmur heard. Pulmonary/Chest: Effort normal. No respiratory distress. She has no wheezes.  Abdominal: Soft. Bowel sounds are normal. She exhibits no distension. There is no tenderness. There is no rebound and no guarding.  Mild left flank tenderness  Genitourinary: Vagina  normal and uterus normal.  External exam - normal, no lesions Speculum exam: Pt has some white discharge, no blood Bimanual exam: Patient has no CMT, no adnexal tenderness or fullness and cervical os is closed  Neurological: She is alert and oriented to person, place, and time.  Skin: Skin is warm and dry.    ED Course  Procedures (including critical care time) Labs Review Labs Reviewed  WET PREP, GENITAL - Abnormal; Notable for the following:    Clue  Cells Wet Prep HPF POC MODERATE (*)    All other components within normal limits  URINALYSIS, ROUTINE W REFLEX MICROSCOPIC - Abnormal; Notable for the following:    Leukocytes, UA SMALL (*)    All other components within normal limits  HCG, QUANTITATIVE, PREGNANCY - Abnormal; Notable for the following:    hCG, Beta Chain, Quant, S 1002 (*)    All other components within normal limits  POCT PREGNANCY, URINE - Abnormal; Notable for the following:    Preg Test, Ur POSITIVE (*)    All other components within normal limits  GC/CHLAMYDIA PROBE AMP  URINE MICROSCOPIC-ADD ON   Imaging Review US Ob Comp Less 14 Wks  04/10/2013   CLINICAL DATA:  Pelvic pain.  EXAM: OBSTETRIC <14 WK [place] AND TRANSVAGINAL OB US  TECHNIQUE: Both transabdominal and transvaginal ultrasound examinations were performed for complete evaluation of the gestation as well as the maternal uterus, adnexal regions, and pelvic cul-de-sac. Transvaginal technique was performed to assess early pregnancy.  COMPARISON:  No priors.  FINDINGS: Intrauterine gestational sac: Single round gestational sac identified in the endometrial.  Yolk sac:  None.  Embryo:  None.  Cardiac Activity: None.  Heart Rate:  N/A  MSD:  3.6 mm   5 w   1  d        [place] EDC: 12/10/2013  Maternal uterus/adnexae: No evidence of subchorionic hemorrhage. Bilateral ovaries are normal in echotexture and appearance. Trace volume of free fluid the cul-de-sac.  IMPRESSION: 1. Single IUP with estimated gestational age of [redacted] weeks and 1 day, as above. 2. Small volume of free fluid in the cul-de-sac.   Electronically Signed   By: Trudie Reed M.D.   On: 04/10/2013 23:47   US Ob Transvaginal  04/10/2013   CLINICAL DATA:  Pelvic pain.  EXAM: OBSTETRIC <14 WK [place] AND TRANSVAGINAL OB US  TECHNIQUE: Both transabdominal and transvaginal ultrasound examinations were performed for complete evaluation of the gestation as well as the maternal uterus, adnexal regions, and pelvic cul-de-sac.  Transvaginal technique was performed to assess early pregnancy.  COMPARISON:  No priors.  FINDINGS: Intrauterine gestational sac: Single round gestational sac identified in the endometrial.  Yolk sac:  None.  Embryo:  None.  Cardiac Activity: None.  Heart Rate:  N/A  MSD:  3.6 mm   5 w   1  d        [place] EDC: 12/10/2013  Maternal uterus/adnexae: No evidence of subchorionic hemorrhage. Bilateral ovaries are normal in echotexture and appearance. Trace volume of free fluid the cul-de-sac.  IMPRESSION: 1. Single IUP with estimated gestational age of [redacted] weeks and 1 day, as above. 2. Small volume of free fluid in the cul-de-sac.   Electronically Signed   By: Trudie Reed M.D.   On: 04/10/2013 23:47    EKG Interpretation   None       MDM   1. Bacterial vaginosis   2. Pregnancy   3. Left flank pain  Pt comes in with cc of UTI like sx and is noted to be pregnant.  Her [place] is clean. Will culture the urine. Suspected pyelonephritis initially - but with a completely normal UA, don't think i should just empirically treat her for pyelo - and i spoke with OB on call, and they agree with the plan.  Pelvic exam is normal. + BV - will give flagyl for that. US shows an IUP.   Pt advised to start prenatals. Pt to see [facility] in 1 week Return precautions discussed.   Derwood Kaplan, MD 04/11/13 [phone]

## 2013-05-09 ENCOUNTER — Ambulatory Visit (INDEPENDENT_AMBULATORY_CARE_PROVIDER_SITE_OTHER): Payer: Managed Care, Other (non HMO) | Admitting: Obstetrics and Gynecology

## 2013-05-09 ENCOUNTER — Encounter: Payer: Self-pay | Admitting: Obstetrics and Gynecology

## 2013-05-09 VITALS — BP 111/71 | Temp 97.5°F | Ht 64.25 in | Wt 112.4 lb

## 2013-05-09 DIAGNOSIS — J45909 Unspecified asthma, uncomplicated: Secondary | ICD-10-CM | POA: Insufficient documentation

## 2013-05-09 DIAGNOSIS — Z3401 Encounter for supervision of normal first pregnancy, first trimester: Secondary | ICD-10-CM

## 2013-05-09 DIAGNOSIS — Z3400 Encounter for supervision of normal first pregnancy, unspecified trimester: Secondary | ICD-10-CM | POA: Insufficient documentation

## 2013-05-09 DIAGNOSIS — Z23 Encounter for immunization: Secondary | ICD-10-CM

## 2013-05-09 LAB — POCT URINALYSIS DIP (DEVICE)
Bilirubin Urine: NEGATIVE
Glucose, UA: NEGATIVE mg/dL
Hgb urine dipstick: NEGATIVE
Specific Gravity, Urine: >=1.03 (ref 1.005–1.030)
Urobilinogen, UA: 0.2 mg/dL (ref 0.0–1.0)
pH: 6 (ref 5.0–8.0)

## 2013-05-09 MED ORDER — DOCUSATE SODIUM 100 MG PO CAPS: 100.0000 mg | ORAL_CAPSULE | Freq: Two times a day (BID) | ORAL | Status: DC | PRN

## 2013-05-09 MED ORDER — ONDANSETRON 4 MG PO TBDP: 4.0000 mg | ORAL_TABLET | Freq: Four times a day (QID) | ORAL | Status: DC | PRN

## 2013-05-09 NOTE — Addendum Note (Signed)
Addended by: Louanna Raw on: 05/09/2013 10:31 AM   Modules accepted: Orders

## 2013-05-09 NOTE — Patient Instructions (Signed)
Pregnancy - First Trimester During sexual intercourse, millions of sperm go into the vagina. Only 1 sperm will penetrate and fertilize the female egg while it is in the Fallopian tube. One week later, the fertilized egg implants into the wall of the uterus. An embryo begins to develop into a baby. At 6 to 8 weeks, the eyes and face are formed and the heartbeat can be seen on ultrasound. At the end of 12 weeks (first trimester), all the baby's organs are formed. Now that you are pregnant, you will want to do everything you can to have a healthy baby. Two of the most important things are to get good prenatal care and follow your caregiver's instructions. Prenatal care is all the medical care you receive before the baby's birth. It is given to prevent, find, and treat problems during the pregnancy and childbirth. PRENATAL EXAMS  During prenatal visits, your weight, blood pressure, and urine are checked. This is done to make sure you are healthy and progressing normally during the pregnancy.  A pregnant woman should gain 25 to 35 pounds during the pregnancy. However, if you are overweight or underweight, your caregiver will advise you regarding your weight.  Your caregiver will ask and answer questions for you.  Blood work, cervical cultures, other necessary tests, and a Pap test are done during your prenatal exams. These tests are done to check on your health and the probable health of your baby. Tests are strongly recommended and done for HIV with your permission. This is the virus that causes AIDS. These tests are done because medicines can be given to help prevent your baby from being born with this infection should you have been infected without knowing it. Blood work is also used to find out your blood type, previous infections, and follow your blood levels (hemoglobin).  Low hemoglobin (anemia) is common during pregnancy. Iron and vitamins are given to help prevent this. Later in the pregnancy,  blood tests for diabetes will be done along with any other tests if any problems develop.  You may need other tests to make sure you and the baby are doing well. CHANGES DURING THE FIRST TRIMESTER  Your body goes through many changes during pregnancy. They vary from person to person. Talk to your caregiver about changes you notice and are concerned about. Changes can include:  Your menstrual period stops.  The egg and sperm carry the genes that determine what you look like. Genes from you and your partner are forming a baby. The female genes determine whether the baby is a boy or a girl.  Your body increases in girth and you may feel bloated.  Feeling sick to your stomach (nauseous) and throwing up (vomiting). If the vomiting is uncontrollable, call your caregiver.  Your breasts will begin to enlarge and become tender.  Your nipples may stick out more and become darker.  The need to urinate more. Painful urination may mean you have a bladder infection.  Tiring easily.  Loss of appetite.  Cravings for certain kinds of food.  At first, you may gain or lose a couple of pounds.  You may have changes in your emotions from day to day (excited to be pregnant or concerned something may go wrong with the pregnancy and baby).  You may have more vivid and strange dreams. HOME CARE INSTRUCTIONS   It is very important to avoid all smoking, alcohol and non-prescribed drugs during your pregnancy. These affect the formation and growth of the baby.   Avoid chemicals while pregnant to ensure the delivery of a healthy infant.  Start your prenatal visits by the 12th week of pregnancy. They are usually scheduled monthly at first, then more often in the last 2 months before delivery. Keep your caregiver's appointments. Follow your caregiver's instructions regarding medicine use, blood and lab tests, exercise, and diet.  During pregnancy, you are providing food for you and your baby. Eat regular,  well-balanced meals. Choose foods such as meat, fish, milk and other low fat dairy products, vegetables, fruits, and whole-grain breads and cereals. Your caregiver will tell you of the ideal weight gain.  You can help morning sickness by keeping soda crackers at the bedside. Eat a couple before arising in the morning. You may want to use the crackers without salt on them.  Eating 4 to 5 small meals rather than 3 large meals a day also may help the nausea and vomiting.  Drinking liquids between meals instead of during meals also seems to help nausea and vomiting.  A physical sexual relationship may be continued throughout pregnancy if there are no other problems. Problems may be early (premature) leaking of amniotic fluid from the membranes, vaginal bleeding, or belly (abdominal) pain.  Exercise regularly if there are no restrictions. Check with your caregiver or physical therapist if you are unsure of the safety of some of your exercises. Greater weight gain will occur in the last 2 trimesters of pregnancy. Exercising will help:  Control your weight.  Keep you in shape.  Prepare you for labor and delivery.  Help you lose your pregnancy weight after you deliver your baby.  Wear a good support or jogging bra for breast tenderness during pregnancy. This may help if worn during sleep too.  Ask when prenatal classes are available. Begin classes when they are offered.  Do not use hot tubs, steam rooms, or saunas.  Wear your seat belt when driving. This protects you and your baby if you are in an accident.  Avoid raw meat, uncooked cheese, cat litter boxes, and soil used by cats throughout the pregnancy. These carry germs that can cause birth defects in the baby.  The first trimester is a good time to visit your dentist for your dental health. Getting your teeth cleaned is okay. Use a softer toothbrush and brush gently during pregnancy.  Ask for help if you have financial, counseling, or  nutritional needs during pregnancy. Your caregiver will be able to offer counseling for these needs as well as refer you for other special needs.  Do not take any medicines or herbs unless told by your caregiver.  Inform your caregiver if there is any mental or physical domestic violence.  Make a list of emergency phone numbers of family, friends, hospital, and police and fire departments.  Write down your questions. Take them to your prenatal visit.  Do not douche.  Do not cross your legs.  If you have to stand for long periods of time, rotate you feet or take small steps in a circle.  You may have more vaginal secretions that may require a sanitary pad. Do not use tampons or scented sanitary pads. MEDICINES AND DRUG USE IN PREGNANCY  Take prenatal vitamins as directed. The vitamin should contain 1 milligram of folic acid. Keep all vitamins out of reach of children. Only a couple vitamins or tablets containing iron may be fatal to a baby or young child when ingested.  Avoid use of all medicines, including herbs, over-the-counter medicines, not   prescribed or suggested by your caregiver. Only take over-the-counter or prescription medicines for pain, discomfort, or fever as directed by your caregiver. Do not use aspirin, ibuprofen, or naproxen unless directed by your caregiver.  Let your caregiver also know about herbs you may be using.  Alcohol is related to a number of birth defects. This includes fetal alcohol syndrome. All alcohol, in any form, should be avoided completely. Smoking will cause low birth rate and premature babies.  Street or illegal drugs are very harmful to the baby. They are absolutely forbidden. A baby born to an addicted mother will be addicted at birth. The baby will go through the same withdrawal an adult does.  Let your caregiver know about any medicines that you have to take and for what reason you take them. SEEK MEDICAL CARE IF:  You have any concerns or  worries during your pregnancy. It is better to call with your questions if you feel they cannot wait, rather than worry about them. SEEK IMMEDIATE MEDICAL CARE IF:   An unexplained oral temperature above 102 F (38.9 C) develops, or as your caregiver suggests.  You have leaking of fluid from the vagina (birth canal). If leaking membranes are suspected, take your temperature and inform your caregiver of this when you call.  There is vaginal spotting or bleeding. Notify your caregiver of the amount and how many pads are used.  You develop a bad smelling vaginal discharge with a change in the color.  You continue to feel sick to your stomach (nauseated) and have no relief from remedies suggested. You vomit blood or coffee ground-like materials.  You lose more than 2 pounds of weight in 1 week.  You gain more than 2 pounds of weight in 1 week and you notice swelling of your face, hands, feet, or legs.  You gain 5 pounds or more in 1 week (even if you do not have swelling of your hands, face, legs, or feet).  You get exposed to [place] measles and have never had them.  You are exposed to fifth disease or chickenpox.  You develop belly (abdominal) pain. Round ligament discomfort is a common non-cancerous (benign) cause of abdominal pain in pregnancy. Your caregiver still must evaluate this.  You develop headache, fever, diarrhea, pain with urination, or shortness of breath.  You fall or are in a car accident or have any kind of trauma.  There is mental or physical violence in your home. Document Released: 05/19/2001 Document Revised: 02/17/2012 Document Reviewed: 11/20/2008 [facility] Patient Information 2014 [place].  Contraception Choices Contraception (birth control) is the use of any methods or devices to prevent pregnancy. Below are some methods to help avoid pregnancy. HORMONAL METHODS   Contraceptive implant This is a thin, plastic tube containing progesterone hormone. It  does not contain estrogen hormone. Your health care provider inserts the tube in the inner part of the upper arm. The tube can remain in place for up to 3 years. After 3 years, the implant must be removed. The implant prevents the ovaries from releasing an egg (ovulation), thickens the cervical mucus to prevent sperm from entering the uterus, and thins the lining of the inside of the uterus.  Progesterone-only injections These injections are given every 3 months by your health care provider to prevent pregnancy. This synthetic progesterone hormone stops the ovaries from releasing eggs. It also thickens cervical mucus and changes the uterine lining. This makes it harder for sperm to survive in the uterus.  Birth  control pills These pills contain estrogen and progesterone hormone. They work by preventing the ovaries from releasing eggs (ovulation). They also cause the cervical mucus to thicken, preventing the sperm from entering the uterus. Birth control pills are prescribed by a health care provider.Birth control pills can also be used to treat heavy periods.  Minipill This type of birth control pill contains only the progesterone hormone. They are taken every day of each month and must be prescribed by your health care provider.  Birth control patch The patch contains hormones similar to those in birth control pills. It must be changed once a week and is prescribed by a health care provider.  Vaginal ring The ring contains hormones similar to those in birth control pills. It is left in the vagina for 3 weeks, removed for 1 week, and then a new one is put back in place. The patient must be comfortable inserting and removing the ring from the vagina.A health care provider's prescription is necessary.  Emergency contraception Emergency contraceptives prevent pregnancy after unprotected sexual intercourse. This pill can be taken right after sex or up to 5 days after unprotected sex. It is most effective  the sooner you take the pills after having sexual intercourse. Most emergency contraceptive pills are available without a prescription. Check with your pharmacist. Do not use emergency contraception as your only form of birth control. BARRIER METHODS   Female condom This is a thin sheath (latex or rubber) that is worn over the penis during sexual intercourse. It can be used with spermicide to increase effectiveness.  Female condom. This is a soft, loose-fitting sheath that is put into the vagina before sexual intercourse.  Diaphragm This is a soft, latex, dome-shaped barrier that must be fitted by a health care provider. It is inserted into the vagina, along with a spermicidal jelly. It is inserted before intercourse. The diaphragm should be left in the vagina for 6 to 8 hours after intercourse.  Cervical cap This is a round, soft, latex or plastic cup that fits over the cervix and must be fitted by a health care provider. The cap can be left in place for up to 48 hours after intercourse.  Sponge This is a soft, circular piece of polyurethane foam. The sponge has spermicide in it. It is inserted into the vagina after wetting it and before sexual intercourse.  Spermicides These are chemicals that kill or block sperm from entering the cervix and uterus. They come in the form of creams, jellies, suppositories, foam, or tablets. They do not require a prescription. They are inserted into the vagina with an applicator before having sexual intercourse. The process must be repeated every time you have sexual intercourse. INTRAUTERINE CONTRACEPTION  Intrauterine device (IUD) This is a T-shaped device that is put in a woman's uterus during a menstrual period to prevent pregnancy. There are 2 types:  Copper IUD This type of IUD is wrapped in copper wire and is placed inside the uterus. Copper makes the uterus and fallopian tubes produce a fluid that kills sperm. It can stay in place for 10 years.  Hormone IUD  This type of IUD contains the hormone progestin (synthetic progesterone). The hormone thickens the cervical mucus and prevents sperm from entering the uterus, and it also thins the uterine lining to prevent implantation of a fertilized egg. The hormone can weaken or kill the sperm that get into the uterus. It can stay in place for 3 5 years, depending on which type of  IUD is used. PERMANENT METHODS OF CONTRACEPTION  Female tubal ligation This is when the woman's fallopian tubes are surgically sealed, tied, or blocked to prevent the egg from traveling to the uterus.  Hysteroscopic sterilization This involves placing a small coil or insert into each fallopian tube. Your doctor uses a technique called hysteroscopy to do the procedure. The device causes scar tissue to form. This results in permanent blockage of the fallopian tubes, so the sperm cannot fertilize the egg. It takes about 3 months after the procedure for the tubes to become blocked. You must use another form of birth control for these 3 months.  Female sterilization This is when the female has the tubes that carry sperm tied off (vasectomy).This blocks sperm from entering the vagina during sexual intercourse. After the procedure, the man can still ejaculate fluid (semen). NATURAL PLANNING METHODS  Natural family planning This is not having sexual intercourse or using a barrier method (condom, diaphragm, cervical cap) on days the woman could become pregnant.  Calendar method This is keeping track of the length of each menstrual cycle and identifying when you are fertile.  Ovulation method This is avoiding sexual intercourse during ovulation.  Symptothermal method This is avoiding sexual intercourse during ovulation, using a thermometer and ovulation symptoms.  Post ovulation method This is timing sexual intercourse after you have ovulated. Regardless of which type or method of contraception you choose, it is important that you use condoms to  protect against the transmission of sexually transmitted infections (STIs). Talk with your health care provider about which form of contraception is most appropriate for you. Document Released: 05/25/2005 Document Revised: 01/25/2013 Document Reviewed: 11/17/2012 [facility] Patient Information 2014 [place].  Breastfeeding Deciding to breastfeed is one of the best choices you can make for you and your baby. A change in hormones during pregnancy causes your breast tissue to grow and increases the number and size of your milk ducts. These hormones also allow proteins, sugars, and fats from your blood supply to make breast milk in your milk-producing glands. Hormones prevent breast milk from being released before your baby is born as well as prompt milk flow after birth. Once breastfeeding has begun, thoughts of your baby, as well as his or her sucking or crying, can stimulate the release of milk from your milk-producing glands.  BENEFITS OF BREASTFEEDING For Your Baby  Your first milk (colostrum) helps your baby's digestive system function better.   There are antibodies in your milk that help your baby fight off infections.   Your baby has a lower incidence of asthma, allergies, and sudden infant death syndrome.   The nutrients in breast milk are better for your baby than infant formulas and are designed uniquely for your baby's needs.   Breast milk improves your baby's brain development.   Your baby is less likely to develop other conditions, such as childhood obesity, asthma, or type 2 diabetes mellitus.  For You   Breastfeeding helps to create a very special bond between you and your baby.   Breastfeeding is convenient. Breast milk is always available at the correct temperature and costs nothing.   Breastfeeding helps to burn calories and helps you lose the weight gained during pregnancy.   Breastfeeding makes your uterus contract to its prepregnancy size faster and slows  bleeding (lochia) after you give birth.   Breastfeeding helps to lower your risk of developing type 2 diabetes mellitus, osteoporosis, and breast or ovarian cancer later in life. SIGNS THAT YOUR  BABY IS HUNGRY Early Signs of Hunger  Increased alertness or activity.  Stretching.  Movement of the head from side to side.  Movement of the head and opening of the mouth when the corner of the mouth or cheek is stroked (rooting).  Increased sucking sounds, smacking lips, cooing, sighing, or squeaking.  Hand-to-mouth movements.  Increased sucking of fingers or hands. Late Signs of Hunger  Fussing.  Intermittent crying. Extreme Signs of Hunger Signs of extreme hunger will require calming and consoling before your baby will be able to breastfeed successfully. Do not wait for the following signs of extreme hunger to occur before you initiate breastfeeding:   Restlessness.  A loud, strong cry.   Screaming. BREASTFEEDING BASICS Breastfeeding Initiation  Find a comfortable place to sit or lie down, with your neck and back well supported.  Place a pillow or rolled up blanket under your baby to bring him or her to the level of your breast (if you are seated). Nursing pillows are specially designed to help support your arms and your baby while you breastfeed.  Make sure that your baby's abdomen is facing your abdomen.   Gently massage your breast. With your fingertips, massage from your chest wall toward your nipple in a circular motion. This encourages milk flow. You may need to continue this action during the feeding if your milk flows slowly.  Support your breast with 4 fingers underneath and your thumb above your nipple. Make sure your fingers are well away from your nipple and your baby's mouth.   Stroke your baby's lips gently with your finger or nipple.   When your baby's mouth is open wide enough, quickly bring your baby to your breast, placing your entire nipple and as  much of the colored area around your nipple (areola) as possible into your baby's mouth.   More areola should be visible above your baby's upper lip than below the lower lip.   Your baby's tongue should be between his or her lower gum and your breast.   Ensure that your baby's mouth is correctly positioned around your nipple (latched). Your baby's lips should create a seal on your breast and be turned out (everted).  It is common for your baby to suck about 2 3 minutes in order to start the flow of breast milk. Latching Teaching your baby how to latch on to your breast properly is very important. An improper latch can cause nipple pain and decreased milk supply for you and poor weight gain in your baby. Also, if your baby is not latched onto your nipple properly, he or she may swallow some air during feeding. This can make your baby fussy. Burping your baby when you switch breasts during the feeding can help to get rid of the air. However, teaching your baby to latch on properly is still the best way to prevent fussiness from swallowing air while breastfeeding. Signs that your baby has successfully latched on to your nipple:    Silent tugging or silent sucking, without causing you pain.   Swallowing heard between every 3 4 sucks.    Muscle movement above and in front of his or her ears while sucking.  Signs that your baby has not successfully latched on to nipple:   Sucking sounds or smacking sounds from your baby while breastfeeding.  Nipple pain. If you think your baby has not latched on correctly, slip your finger into the corner of your baby's mouth to break the suction and place  it between your baby's gums. Attempt breastfeeding initiation again. Signs of Successful Breastfeeding Signs from your baby:   A gradual decrease in the number of sucks or complete cessation of sucking.   Falling asleep.   Relaxation of his or her body.   Retention of a small amount of milk in  his or her mouth.   Letting go of your breast by himself or herself. Signs from you:  Breasts that have increased in firmness, weight, and size 1 3 hours after feeding.   Breasts that are softer immediately after breastfeeding.  Increased milk volume, as well as a change in milk consistency and color by the 5th day of breastfeeding.   Nipples that are not sore, cracked, or bleeding. Signs That Your Baby is Getting Enough Milk  Wetting at least 3 diapers in a 24-hour period. The urine should be clear and pale yellow by age 5 days.  At least 3 stools in a 24-hour period by age 5 days. The stool should be soft and yellow.  At least 3 stools in a 24-hour period by age 7 days. The stool should be seedy and yellow.  No loss of weight greater than 10% of birth weight during the first 3 days of age.  Average weight gain of 4 7 ounces (120 210 mL) per week after age 4 days.  Consistent daily weight gain by age 5 days, without weight loss after the age of 2 weeks. After a feeding, your baby may spit up a small amount. This is common. BREASTFEEDING FREQUENCY AND DURATION Frequent feeding will help you make more milk and can prevent sore nipples and breast engorgement. Breastfeed when you feel the need to reduce the fullness of your breasts or when your baby shows signs of hunger. This is called "breastfeeding on demand." Avoid introducing a pacifier to your baby while you are working to establish breastfeeding (the first 4 6 weeks after your baby is born). After this time you may choose to use a pacifier. Research has shown that pacifier use during the first year of a baby's life decreases the risk of sudden infant death syndrome (SIDS). Allow your baby to feed on each breast as long as he or she wants. Breastfeed until your baby is finished feeding. When your baby unlatches or falls asleep while feeding from the first breast, offer the second breast. Because newborns are often sleepy in the  first few weeks of life, you may need to awaken your baby to get him or her to feed. Breastfeeding times will vary from baby to baby. However, the following rules can serve as a guide to help you ensure that your baby is properly fed:  Newborns (babies 4 weeks of age or younger) may breastfeed every 1 3 hours.  Newborns should not go longer than 3 hours during the day or 5 hours during the night without breastfeeding.  You should breastfeed your baby a minimum of 8 times in a 24-hour period until you begin to introduce solid foods to your baby at around 6 months of age. BREAST MILK PUMPING Pumping and storing breast milk allows you to ensure that your baby is exclusively fed your breast milk, even at times when you are unable to breastfeed. This is especially important if you are going back to work while you are still breastfeeding or when you are not able to be present during feedings. Your lactation consultant can give you guidelines on how long it is safe to store breast   milk.  A breast pump is a machine that allows you to pump milk from your breast into a sterile bottle. The pumped breast milk can then be stored in a refrigerator or freezer. Some breast pumps are operated by hand, while others use electricity. Ask your lactation consultant which type will work best for you. Breast pumps can be purchased, but some hospitals and breastfeeding support groups lease breast pumps on a monthly basis. A lactation consultant can teach you how to hand express breast milk, if you prefer not to use a pump.  CARING FOR YOUR BREASTS WHILE YOU BREASTFEED Nipples can become dry, cracked, and sore while breastfeeding. The following recommendations can help keep your breasts moisturized and healthy:  Avoid using soap on your nipples.   Wear a supportive bra. Although not required, special nursing bras and tank tops are designed to allow access to your breasts for breastfeeding without taking off your entire bra  or top. Avoid wearing underwire style bras or extremely tight bras.  Air dry your nipples for 3 84minutes after each feeding.   Use only cotton bra pads to absorb leaked breast milk. Leaking of breast milk between feedings is normal.   Use lanolin on your nipples after breastfeeding. Lanolin helps to maintain your skin's normal moisture barrier. If you use pure lanolin you do not need to wash it off before feeding your baby again. Pure lanolin is not toxic to your baby. You may also hand express a few drops of breast milk and gently massage that milk into your nipples and allow the milk to air dry. In the first few weeks after giving birth, some women experience extremely full breasts (engorgement). Engorgement can make your breasts feel heavy, warm, and tender to the touch. Engorgement peaks within 3 5 days after you give birth. The following recommendations can help ease engorgement:  Completely empty your breasts while breastfeeding or pumping. You may want to start by applying warm, moist heat (in the shower or with warm water-soaked hand towels) just before feeding or pumping. This increases circulation and helps the milk flow. If your baby does not completely empty your breasts while breastfeeding, pump any extra milk after he or she is finished.  Wear a snug bra (nursing or regular) or tank top for 1 2 days to signal your body to slightly decrease milk production.  Apply ice packs to your breasts, unless this is too uncomfortable for you.  Make sure that your baby is latched on and positioned properly while breastfeeding. If engorgement persists after 48 hours of following these recommendations, contact your health care provider or a Science writer. OVERALL HEALTH CARE RECOMMENDATIONS WHILE BREASTFEEDING  Eat healthy foods. Alternate between meals and snacks, eating 3 of each per day. Because what you eat affects your breast milk, some of the foods may make your baby more irritable  than usual. Avoid eating these foods if you are sure that they are negatively affecting your baby.  Drink milk, fruit juice, and water to satisfy your thirst (about 10 glasses a day).   Rest often, relax, and continue to take your prenatal vitamins to prevent fatigue, stress, and anemia.  Continue breast self-awareness checks.  Avoid chewing and smoking tobacco.  Avoid alcohol and drug use. Some medicines that may be harmful to your baby can pass through breast milk. It is important to ask your health care provider before taking any medicine, including all over-the-counter and prescription medicine as well as vitamin and herbal supplements.  It is possible to become pregnant while breastfeeding. If birth control is desired, ask your health care provider about options that will be safe for your baby. SEEK MEDICAL CARE IF:   You feel like you want to stop breastfeeding or have become frustrated with breastfeeding.  You have painful breasts or nipples.  Your nipples are cracked or bleeding.  Your breasts are red, tender, or warm.  You have a swollen area on either breast.  You have a fever or chills.  You have nausea or vomiting.  You have drainage other than breast milk from your nipples.  Your breasts do not become full before feedings by the 5th day after you give birth.  You feel sad and depressed.  Your baby is too sleepy to eat well.  Your baby is having trouble sleeping.   Your baby is wetting less than 3 diapers in a 24-hour period.  Your baby has less than 3 stools in a 24-hour period.  Your baby's skin or the white part of his or her eyes becomes yellow.   Your baby is not gaining weight by 6 days of age. SEEK IMMEDIATE MEDICAL CARE IF:   Your baby is overly tired (lethargic) and does not want to wake up and feed.  Your baby develops an unexplained fever. Document Released: 05/25/2005 Document Revised: 01/25/2013 Document Reviewed: 11/16/2012 [facility]  Patient Information 2014 [place].

## 2013-05-09 NOTE — Progress Notes (Signed)
P= 79 Discussed appropriate weight gain based on BMI for this pregnancy; pt. Verbalized understanding.  New OB packet given to patient.  Flu vaccine today.

## 2013-05-09 NOTE — Progress Notes (Signed)
   Subjective:    Melanie Galloway is a G1P0 [redacted]w[redacted]d being seen today for her first obstetrical visit.  Her obstetrical history is significant for first normal pregnancy and h/o asthma. Patient reports using inhaler once a month at most. Never intubated. Patient does intend to breast feed. Pregnancy history fully reviewed.  Patient reports no complaints.  Filed Vitals:   05/09/13 0825 05/09/13 0829  BP: 111/71   Temp: 97.5 F (36.4 C)   Height:  5' 4.25" (1.632 m)  Weight: 112 lb 6.4 oz (50.984 kg)     HISTORY: OB History  Gravida Para Term Preterm AB SAB TAB Ectopic Multiple Living  1             # Outcome Date GA Lbr Len/2nd Weight Sex Delivery Anes PTL Lv  1 CUR              Past Medical History  Diagnosis Date  . Stomach ulcer    Past Surgical History  Procedure Laterality Date  . Eye muscle surgery    . External ear surgery     Family History  Problem Relation Age of Onset  . Cancer Mother   . Asthma Sister      Exam    Uterus:     Pelvic Exam:    Perineum: Normal Perineum   Vulva: normal   Vagina:  normal mucosa, normal discharge   pH:    Cervix: closed and long   Adnexa: normal adnexa and no mass, fullness, tenderness   Bony Pelvis: android  System: Breast:  normal appearance, no masses or tenderness   Skin: normal coloration and turgor, no rashes    Neurologic: oriented, no focal deficits   Extremities: normal strength, tone, and muscle mass   HEENT extra ocular movement intact   Mouth/Teeth mucous membranes moist, pharynx normal without lesions   Neck supple and no masses   Cardiovascular: regular rate and rhythm   Respiratory:  chest clear, no wheezing, crepitations, rhonchi, normal symmetric air entry   Abdomen: soft, non-tender; bowel sounds normal; no masses,  no organomegaly   Urinary:       Assessment:    Pregnancy: G1P0 Patient Active Problem List   Diagnosis Date Noted  . Supervision of normal first pregnancy 05/09/2013  .  Asthma complicating pregnancy, antepartum 05/09/2013        Plan:     Initial labs drawn. Prenatal vitamins. Problem list reviewed and updated. Genetic Screening discussed First Screen: requested.  Ultrasound discussed; fetal survey: requested. Will be ordered at a later visit  Follow up in 4 weeks. 50% of 30 min visit spent on counseling and coordination of care.     Alphonza Tramell 05/09/2013

## 2013-05-10 ENCOUNTER — Encounter: Payer: Self-pay | Admitting: *Deleted

## 2013-05-10 LAB — OBSTETRIC PANEL
Antibody Screen: NEGATIVE
Basophils Absolute: 0 10*3/uL (ref 0.0–0.1)
Basophils Relative: 0 % (ref 0–1)
Eosinophils Absolute: 0.2 10*3/uL (ref 0.0–0.7)
Eosinophils Relative: 3 % (ref 0–5)
HCT: 36.5 % (ref 36.0–46.0)
Lymphocytes Relative: 21 % (ref 12–46)
MCH: 30.4 pg (ref 26.0–34.0)
MCHC: 35.3 g/dL (ref 30.0–36.0)
MCV: 85.9 fL (ref 78.0–100.0)
Monocytes Absolute: 0.6 10*3/uL (ref 0.1–1.0)
Monocytes Relative: 8 % (ref 3–12)
Platelets: 239 10*3/uL (ref 150–400)
RDW: 13.7 % (ref 11.5–15.5)
Rh Type: POSITIVE
Rubella: 12.3 Index — ABNORMAL HIGH (ref <0.90)
WBC: 8.1 10*3/uL (ref 4.0–10.5)

## 2013-05-10 LAB — ALCOHOL METABOLITE (ETG), URINE: Ethyl Glucuronide (EtG): NEGATIVE ng/mL

## 2013-05-11 LAB — PRESCRIPTION MONITORING PROFILE (19 PANEL)
Barbiturate Screen, Urine: NEGATIVE ng/mL
Carisoprodol, Urine: NEGATIVE ng/mL
Cocaine Metabolites: NEGATIVE ng/mL
Creatinine, Urine: 97.33 mg/dL (ref >20.0)
Fentanyl, Ur: NEGATIVE ng/mL
MDMA URINE: NEGATIVE ng/mL
Meperidine, Ur: NEGATIVE ng/mL
Nitrites, Initial: NEGATIVE ug/mL
Opiate Screen, Urine: NEGATIVE ng/mL
Oxycodone Screen, Ur: NEGATIVE ng/mL
Phencyclidine, Ur: NEGATIVE ng/mL
Propoxyphene: NEGATIVE ng/mL
Tramadol Scrn, Ur: NEGATIVE ng/mL
pH, Initial: 5.6 pH (ref 4.5–8.9)

## 2013-05-11 LAB — CANNABANOIDS (GC/LC/MS), URINE: THC-COOH (GC/LC/MS), ur confirm: 391 ng/mL — AB

## 2013-05-12 ENCOUNTER — Encounter (HOSPITAL_COMMUNITY): Payer: Self-pay | Admitting: Emergency Medicine

## 2013-05-12 ENCOUNTER — Emergency Department (HOSPITAL_COMMUNITY)
Admission: EM | Admit: 2013-05-12 | Discharge: 2013-05-12 | Disposition: A | Discharge status: Home / Self Care | Payer: Self-pay | Attending: Emergency Medicine | Admitting: Emergency Medicine

## 2013-05-12 DIAGNOSIS — Z79899 Other long term (current) drug therapy: Secondary | ICD-10-CM | POA: Insufficient documentation

## 2013-05-12 DIAGNOSIS — Z87891 Personal history of nicotine dependence: Secondary | ICD-10-CM | POA: Insufficient documentation

## 2013-05-12 DIAGNOSIS — Z8719 Personal history of other diseases of the digestive system: Secondary | ICD-10-CM | POA: Insufficient documentation

## 2013-05-12 DIAGNOSIS — O210 Mild hyperemesis gravidarum: Secondary | ICD-10-CM | POA: Insufficient documentation

## 2013-05-12 DIAGNOSIS — O219 Vomiting of pregnancy, unspecified: Secondary | ICD-10-CM

## 2013-05-12 LAB — COMPREHENSIVE METABOLIC PANEL
ALT: 42 U/L — ABNORMAL HIGH (ref 0–35)
AST: 34 U/L (ref 0–37)
Albumin: 4.4 g/dL (ref 3.5–5.2)
Alkaline Phosphatase: 52 U/L (ref 39–117)
BUN: 7 mg/dL (ref 6–23)
CO2: 24 mEq/L (ref 19–32)
GFR calc non Af Amer: >90 mL/min (ref >90)
Potassium: 3.6 mEq/L (ref 3.5–5.1)
Sodium: 135 mEq/L (ref 135–145)
Total Protein: 7.6 g/dL (ref 6.0–8.3)

## 2013-05-12 LAB — CBC WITH DIFFERENTIAL/PLATELET
Basophils Absolute: 0 10*3/uL (ref 0.0–0.1)
Basophils Relative: 0 % (ref 0–1)
Eosinophils Absolute: 0.1 10*3/uL (ref 0.0–0.7)
MCH: 30.7 pg (ref 26.0–34.0)
MCHC: 34.9 g/dL (ref 30.0–36.0)
Monocytes Absolute: 0.6 10*3/uL (ref 0.1–1.0)
Neutrophils Relative %: 76 % (ref 43–77)
Platelets: 230 10*3/uL (ref 150–400)
RBC: 4.62 MIL/uL (ref 3.87–5.11)
RDW: 12.7 % (ref 11.5–15.5)

## 2013-05-12 LAB — URINALYSIS, ROUTINE W REFLEX MICROSCOPIC
Bilirubin Urine: NEGATIVE
Ketones, ur: NEGATIVE mg/dL
Nitrite: NEGATIVE
Specific Gravity, Urine: 1.012 (ref 1.005–1.030)
pH: 6 (ref 5.0–8.0)

## 2013-05-12 LAB — LIPASE, BLOOD: Lipase: 28 U/L (ref 11–59)

## 2013-05-12 MED ORDER — SODIUM CHLORIDE 0.9 % IV BOLUS (SEPSIS)
1000.0000 mL | Freq: Once | INTRAVENOUS | Status: AC
  Administered 2013-05-12: 1000 mL via INTRAVENOUS

## 2013-05-12 MED ORDER — ONDANSETRON HCL 8 MG PO TABS: 8.0000 mg | ORAL_TABLET | Freq: Three times a day (TID) | ORAL | Status: DC | PRN

## 2013-05-12 MED ORDER — ONDANSETRON HCL 4 MG/2ML IJ SOLN
4.0000 mg | Freq: Once | INTRAMUSCULAR | Status: AC
  Administered 2013-05-12: 4 mg via INTRAVENOUS
  Filled 2013-05-12: qty 2

## 2013-05-12 NOTE — ED Provider Notes (Signed)
CSN: [id number]     Arrival date & time 05/12/13  1316 History   First MD Initiated Contact with Patient 05/12/13 1336     Chief Complaint  Patient presents with  . Emesis During Pregnancy   (Consider location/radiation/quality/duration/timing/severity/associated sxs/prior Treatment) HPI Comments: Melanie Galloway is a 22 y.o. female presents for evaluation of vomiting for 24 hours. She states she cannot keep anything down. She is [redacted] weeks pregnant. She has had her first prenatal visit at [facility], one week ago. This is her first pregnancy. She denies recent fever, chills, diarrhea, abdominal pain, back, pain, weakness, dizziness, chest pain or shortness of breath. There are no known sick contacts. There are no other known modifying factors.   The history is provided by the patient.    Past Medical History  Diagnosis Date  . Stomach ulcer    Past Surgical History  Procedure Laterality Date  . Eye muscle surgery    . External ear surgery     Family History  Problem Relation Age of Onset  . Cancer Mother   . Asthma Sister    History  Substance Use Topics  . Smoking status: Former Smoker    Types: Cigarettes  . Smokeless tobacco: Never Used  . Alcohol Use: No   OB History   Grav Para Term Preterm Abortions TAB SAB Ect Mult Living   1              Review of Systems  All other systems reviewed and are negative.    Allergies  Review of patient's allergies indicates no known allergies.  Home Medications   Current Outpatient Rx  Name  Route  Sig  Dispense  Refill  . albuterol (PROVENTIL HFA;VENTOLIN HFA) 108 (90 BASE) MCG/ACT inhaler   Inhalation   Inhale 2 puffs into the lungs every 6 (six) hours as needed for wheezing.         . Prenatal Vit-Fe Fumarate-FA (PRENATAL MULTIVITAMIN) TABS tablet   Oral   Take 1 tablet by mouth daily at 12 noon.         . ondansetron (ZOFRAN) 8 MG tablet   Oral   Take 1 tablet (8 mg total) by mouth every 8 (eight) hours  as needed for nausea or vomiting.   20 tablet   0    Pulse 86  Temp(Src) 97.9 F (36.6 C) (Oral)  Resp 16  SpO2 100%  LMP 02/25/2013 Physical Exam  Nursing note and vitals reviewed. Constitutional: She is oriented to person, place, and time. She appears well-developed and well-nourished. No distress.  HENT:  Head: Normocephalic and atraumatic.  Eyes: Conjunctivae and EOM are normal. Pupils are equal, round, and reactive to light.  Neck: Normal range of motion and phonation normal. Neck supple.  Cardiovascular: Normal rate, regular rhythm and intact distal pulses.   Pulmonary/Chest: Effort normal and breath sounds normal. She exhibits no tenderness.  Abdominal: Soft. She exhibits no distension. There is no tenderness. There is no guarding.  Musculoskeletal: Normal range of motion.  Neurological: She is alert and oriented to person, place, and time. She exhibits normal muscle tone.  Skin: Skin is warm and dry.  Psychiatric: She has a normal mood and affect. Her behavior is normal. Judgment and thought content normal.    ED Course  Procedures (including critical care time)  Medications  sodium chloride 0.9 % bolus 1,000 mL (1,000 mLs Intravenous New Bag/Given 05/12/13 1349)  ondansetron (ZOFRAN) injection 4 mg (4 mg Intravenous Given  05/12/13 1349)    Patient Vitals for the past 24 hrs:  Temp Temp src Pulse Resp SpO2  05/12/13 1324 97.9 F (36.6 C) Oral 86 16 100 %    2:59 PM Reevaluation with update and discussion. After initial assessment and treatment, an updated evaluation reveals she states feeling better. She is tolerating oral fluids now. Melanie Galloway L   Labs Review Labs Reviewed  COMPREHENSIVE METABOLIC PANEL - Abnormal; Notable for the following:    ALT 42 (*)    All other components within normal limits  URINE CULTURE  CBC WITH DIFFERENTIAL  LIPASE, BLOOD  URINALYSIS, ROUTINE W REFLEX MICROSCOPIC   Imaging Review No results found.  EKG Interpretation    None       MDM   1. Nausea/vomiting in pregnancy    Evaluation consistent with morning sickness. She is improved with treatment. Doubt metabolic instability, serious bacterial infection or impending vascular collapse.  Nursing Notes Reviewed/ Care Coordinated, and agree without changes. Applicable Imaging Reviewed.  Interpretation of Laboratory Data incorporated into ED treatment   Plan: Home Medications- Zofran; Home Treatments and Observation- rest. Gradually advance diet; return here if the recommended treatment, does not improve the symptoms; Recommended follow up- OB prn      Flint Melter, MD 05/12/13 1501

## 2013-05-12 NOTE — ED Notes (Signed)
Pt given ginger ale.

## 2013-05-12 NOTE — ED Notes (Signed)
Pt c/o emesis x 1 week, increasing over last 24hrs.  Pt is [redacted] weeks pregnant.  Denies pain.  Pt was recently seen at [facility], but did not receive any prescriptions for nausea.

## 2013-05-13 LAB — URINE CULTURE
Colony Count: NO GROWTH
Culture: NO GROWTH

## 2013-05-13 LAB — CULTURE, OB URINE: Colony Count: >=100000

## 2013-05-15 ENCOUNTER — Encounter: Payer: Self-pay | Admitting: *Deleted

## 2013-05-15 ENCOUNTER — Telehealth: Payer: Self-pay | Admitting: *Deleted

## 2013-05-15 ENCOUNTER — Other Ambulatory Visit: Payer: Self-pay | Admitting: Obstetrics and Gynecology

## 2013-05-15 MED ORDER — CEPHALEXIN 500 MG PO CAPS: 500.0000 mg | ORAL_CAPSULE | Freq: Four times a day (QID) | ORAL | Status: DC

## 2013-05-15 NOTE — Telephone Encounter (Signed)
Called patient and heard message that phone is disconnected or not in service. Will send letter with results.

## 2013-05-15 NOTE — Telephone Encounter (Signed)
Message copied by Mannie Stabile on Mon May 15, 2013  4:18 PM ------      Message from: CONSTANT, PEGGY      Created: Mon May 15, 2013 11:14 AM       Please inform patient of positive UTI. Keflex e-prescribed            Peggy ------

## 2013-05-30 ENCOUNTER — Other Ambulatory Visit: Payer: Self-pay | Admitting: Family Medicine

## 2013-05-30 DIAGNOSIS — Z3682 Encounter for antenatal screening for nuchal translucency: Secondary | ICD-10-CM

## 2013-06-05 ENCOUNTER — Ambulatory Visit (HOSPITAL_COMMUNITY)
Admission: RE | Admit: 2013-06-05 | Discharge: 2013-06-05 | Disposition: A | Discharge status: Home / Self Care | Payer: Self-pay | Source: Ambulatory Visit | Attending: Obstetrics and Gynecology | Admitting: Obstetrics and Gynecology

## 2013-06-05 ENCOUNTER — Encounter (HOSPITAL_COMMUNITY): Payer: Self-pay

## 2013-06-05 ENCOUNTER — Other Ambulatory Visit: Payer: Self-pay

## 2013-06-05 DIAGNOSIS — O351XX0 Maternal care for (suspected) chromosomal abnormality in fetus, not applicable or unspecified: Secondary | ICD-10-CM | POA: Insufficient documentation

## 2013-06-05 DIAGNOSIS — Z3689 Encounter for other specified antenatal screening: Secondary | ICD-10-CM | POA: Insufficient documentation

## 2013-06-05 DIAGNOSIS — Z3682 Encounter for antenatal screening for nuchal translucency: Secondary | ICD-10-CM

## 2013-06-05 DIAGNOSIS — O3510X0 Maternal care for (suspected) chromosomal abnormality in fetus, unspecified, not applicable or unspecified: Secondary | ICD-10-CM | POA: Insufficient documentation

## 2013-06-06 ENCOUNTER — Encounter: Payer: Self-pay | Admitting: Family Medicine

## 2013-06-06 ENCOUNTER — Ambulatory Visit (INDEPENDENT_AMBULATORY_CARE_PROVIDER_SITE_OTHER): Payer: Self-pay | Admitting: Family Medicine

## 2013-06-06 VITALS — BP 96/62 | Temp 97.6°F | Wt 110.2 lb

## 2013-06-06 DIAGNOSIS — N390 Urinary tract infection, site not specified: Secondary | ICD-10-CM

## 2013-06-06 DIAGNOSIS — O2390 Unspecified genitourinary tract infection in pregnancy, unspecified trimester: Secondary | ICD-10-CM

## 2013-06-06 DIAGNOSIS — Z3401 Encounter for supervision of normal first pregnancy, first trimester: Secondary | ICD-10-CM

## 2013-06-06 DIAGNOSIS — O2341 Unspecified infection of urinary tract in pregnancy, first trimester: Secondary | ICD-10-CM | POA: Insufficient documentation

## 2013-06-06 LAB — POCT URINALYSIS DIP (DEVICE)
Bilirubin Urine: NEGATIVE
Protein, ur: NEGATIVE mg/dL
Specific Gravity, Urine: >=1.03 (ref 1.005–1.030)
Urobilinogen, UA: 0.2 mg/dL (ref 0.0–1.0)

## 2013-06-06 MED ORDER — CEPHALEXIN 500 MG PO CAPS: 500.0000 mg | ORAL_CAPSULE | Freq: Four times a day (QID) | ORAL | Status: DC

## 2013-06-06 NOTE — Patient Instructions (Signed)
doxalymine (unasom)  Second Trimester of Pregnancy The second trimester is from week 13 through week 28, months 4 through 6. The second trimester is often a time when you feel your best. Your body has also adjusted to being pregnant, and you begin to feel better physically. Usually, morning sickness has lessened or quit completely, you may have more energy, and you may have an increase in appetite. The second trimester is also a time when the fetus is growing rapidly. At the end of the sixth month, the fetus is about 9 inches long and weighs about 1 pounds. You will likely begin to feel the baby move (quickening) between 18 and 20 weeks of the pregnancy. BODY CHANGES Your body goes through many changes during pregnancy. The changes vary from woman to woman.   Your weight will continue to increase. You will notice your lower abdomen bulging out.  You may begin to get stretch marks on your hips, abdomen, and breasts.  You may develop headaches that can be relieved by medicines approved by your caregiver.  You may urinate more often because the fetus is pressing on your bladder.  You may develop or continue to have heartburn as a result of your pregnancy.  You may develop constipation because certain hormones are causing the muscles that push waste through your intestines to slow down.  You may develop hemorrhoids or swollen, bulging veins (varicose veins).  You may have back pain because of the weight gain and pregnancy hormones relaxing your joints between the bones in your pelvis and as a result of a shift in weight and the muscles that support your balance.  Your breasts will continue to grow and be tender.  Your gums may bleed and may be sensitive to brushing and flossing.  Dark spots or blotches (chloasma, mask of pregnancy) may develop on your face. This will likely fade after the baby is born.  A dark line from your belly button to the pubic area (linea nigra) may appear. This will  likely fade after the baby is born. WHAT TO EXPECT AT YOUR PRENATAL VISITS During a routine prenatal visit:  You will be weighed to make sure you and the fetus are growing normally.  Your blood pressure will be taken.  Your abdomen will be measured to track your baby's growth.  The fetal heartbeat will be listened to.  Any test results from the previous visit will be discussed. Your caregiver may ask you:  How you are feeling.  If you are feeling the baby move.  If you have had any abnormal symptoms, such as leaking fluid, bleeding, severe headaches, or abdominal cramping.  If you have any questions. Other tests that may be performed during your second trimester include:  Blood tests that check for:  Low iron levels (anemia).  Gestational diabetes (between 24 and 28 weeks).  Rh antibodies.  Urine tests to check for infections, diabetes, or protein in the urine.  An ultrasound to confirm the proper growth and development of the baby.  An amniocentesis to check for possible genetic problems.  Fetal screens for spina bifida and Down syndrome. HOME CARE INSTRUCTIONS   Avoid all smoking, herbs, alcohol, and unprescribed drugs. These chemicals affect the formation and growth of the baby.  Follow your caregiver's instructions regarding medicine use. There are medicines that are either safe or unsafe to take during pregnancy.  Exercise only as directed by your caregiver. Experiencing uterine cramps is a good sign to stop exercising.  Continue  to eat regular, healthy meals.  Wear a good support bra for breast tenderness.  Do not use hot tubs, steam rooms, or saunas.  Wear your seat belt at all times when driving.  Avoid raw meat, uncooked cheese, cat litter boxes, and soil used by cats. These carry germs that can cause birth defects in the baby.  Take your prenatal vitamins.  Try taking a stool softener (if your caregiver approves) if you develop constipation. Eat  more high-fiber foods, such as fresh vegetables or fruit and whole grains. Drink plenty of fluids to keep your urine clear or pale yellow.  Take warm sitz baths to soothe any pain or discomfort caused by hemorrhoids. Use hemorrhoid cream if your caregiver approves.  If you develop varicose veins, wear support hose. Elevate your feet for 15 minutes, 3 4 times a day. Limit salt in your diet.  Avoid heavy lifting, wear low heel shoes, and practice good posture.  Rest with your legs elevated if you have leg cramps or low back pain.  Visit your dentist if you have not gone yet during your pregnancy. Use a soft toothbrush to brush your teeth and be gentle when you floss.  A sexual relationship may be continued unless your caregiver directs you otherwise.  Continue to go to all your prenatal visits as directed by your caregiver. SEEK MEDICAL CARE IF:   You have dizziness.  You have mild pelvic cramps, pelvic pressure, or nagging pain in the abdominal area.  You have persistent nausea, vomiting, or diarrhea.  You have a bad smelling vaginal discharge.  You have pain with urination. SEEK IMMEDIATE MEDICAL CARE IF:   You have a fever.  You are leaking fluid from your vagina.  You have spotting or bleeding from your vagina.  You have severe abdominal cramping or pain.  You have rapid weight gain or loss.  You have shortness of breath with chest pain.  You notice sudden or extreme swelling of your face, hands, ankles, feet, or legs.  You have not felt your baby move in over an hour.  You have severe headaches that do not go away with medicine.  You have vision changes. Document Released: 05/19/2001 Document Revised: 01/25/2013 Document Reviewed: 07/26/2012 [facility] Patient Information 2014 [place], [place].

## 2013-06-06 NOTE — Progress Notes (Signed)
Pulse- 84 Patient reports worsening headaches

## 2013-06-06 NOTE — Progress Notes (Signed)
Melanie Galloway is a 22 y.o. G1P0 at [redacted]w[redacted]d by R=5 here for ROB visit.  Discussed with Patient:  - New OB labs from last visit were wnl. - RTC for any VB, regular, painful cramps/ctxs occurring at a rate of >2/10 min, fever (100.5 or higher), n/v/d, any pain that is unresolving or worsening. - Routine precautions(SAB, depression, infection s/s) - RTC in 4 weeks for next appt. - discouraged from breast feeding  To Do: 1.   [ ]  Vaccines: Flu:12/2  Tdap:  [ ]  BCM: undecided  Edu: [ x] PTL precautions; [ ]  BF class; [ ]  childbirth class; [ ]   BF counseling;

## 2013-06-06 NOTE — Progress Notes (Signed)
UTI not treated - new Rx for keflex with discount given to pt. Needs TOC next visit Doxalymine for n/v 1x/day.

## 2013-06-08 NOTE — L&D Delivery Note (Signed)
Delivery Note At 2:55 PM a viable female was delivered via vaginal delivery (Presentation: cephalic ;  ).  APGAR: 7, 9; weight    Placenta status: intact , .  Cord: 3 vessel  with no complications.   Anesthesia:  epidural Episiotomy: none required Lacerations: none Suture Repair: N/A Est. Blood Loss (mL): 400cc  Mom to postpartum.  Baby to Nursery.  Called to delivery. Mother pushed over 30min. Infant delivered to maternal abdomen. Cord clamped and cut. Active management of 3rd stage with traction and following delivery of palcenta Pitocin. Placenta delivered intact with 3v cord. EBL400cc. Counts correct. Hemostatic.   Yolande Jollyaleb G Melancon, MD Resident     Melancon, Hillery Hunteraleb G 12/08/2013, 3:15 PM I spoke with and examined patient and agree with resident's note and plan of care.  Tawana ScaleMichael Ryan Oz Gammel, MD OB Fellow 12/08/2013 4:20 PM

## 2013-06-12 ENCOUNTER — Telehealth (HOSPITAL_COMMUNITY): Payer: Self-pay | Admitting: MS"

## 2013-06-12 NOTE — Telephone Encounter (Signed)
Called Ms. Melanie Galloway regarding her first trimester screen result. First trimester screening was performed through [facility] on 06/05/13. Patient was identified by name and DOB. We discussed that her screen indicates an increased risk for fetal Down syndrome of 1 in 272 (0.4%). Discussed that this is not diagnostic for this condition, and reviewed that she now has additional screens and tests available to her in the pregnancy to obtain more information regarding the presence or absence of Down syndrome, if desired. Briefly discussed options include an additional blood test, a detailed ultrasound in the second trimester, and diagnostic tests (such as amniocentesis) which are also invasive tests. Offered the patient genetic counseling to review the first trimester screening results and additional available screening and testing options in detail. Patient stated that she is interested in additional tests and would like to schedule genetic counseling. Also discussed that trisomy 18/13 risk from first screen was within normal range, reducing the risk to less than 1 in 10,000. Genetic counseling is scheduled for Tuesday, 06/13/13 at 1:00 pm in our office.   Melanie Galloway 06/12/2013 10:49 AM

## 2013-06-13 ENCOUNTER — Ambulatory Visit (HOSPITAL_COMMUNITY)
Admission: RE | Admit: 2013-06-13 | Discharge: 2013-06-13 | Disposition: A | Discharge status: Home / Self Care | Payer: BC Managed Care – PPO | Source: Ambulatory Visit | Attending: Obstetrics and Gynecology | Admitting: Obstetrics and Gynecology

## 2013-06-13 ENCOUNTER — Other Ambulatory Visit: Payer: Self-pay

## 2013-06-13 DIAGNOSIS — IMO0002 Reserved for concepts with insufficient information to code with codable children: Secondary | ICD-10-CM | POA: Insufficient documentation

## 2013-06-13 DIAGNOSIS — O280 Abnormal hematological finding on antenatal screening of mother: Secondary | ICD-10-CM | POA: Insufficient documentation

## 2013-06-13 DIAGNOSIS — O358XX0 Maternal care for other (suspected) fetal abnormality and damage, not applicable or unspecified: Secondary | ICD-10-CM | POA: Insufficient documentation

## 2013-06-13 NOTE — Progress Notes (Signed)
Genetic Counseling  High-Risk Gestation Note  Appointment Date:  06/13/2013 Referred By: Danae Orleans, CNM Date of Birth:  [birth year] Partner: Melanie Galloway   Pregnancy History: G1P0 Estimated Date of Delivery: 12/15/13 Estimated Gestational Age: [redacted]w[redacted]d Attending: Particia Nearing, MD   Ms. Melanie Galloway and her partner, Mr. Melanie Galloway, were seen for genetic counseling because of an increased risk for fetal Down syndrome based on first trimester screening performed through [facility].  They were counseled regarding the First trimester screen result and the associated 1 in 272 risk for fetal Down syndrome.  We reviewed chromosomes, nondisjunction, and the common features and variable prognosis of Down syndrome.  In addition, we reviewed the screen adjusted reduction in risks for trisomy 18/13 (1 in 1,955 to < 1 in 10,000).  We also discussed other explanations for a screen positive result including: differences in maternal metabolism and normal variation. They understand that this screening is not diagnostic for Down syndrome but provides a risk assessment.  We reviewed available screening options including noninvasive prenatal screening (NIPS)/cell free fetal DNA (cffDNA) testing and detailed ultrasound.  They were counseled that screening tests are used to modify a patient's a priori risk for aneuploidy, typically based on age. This estimate provides a pregnancy specific risk assessment. We reviewed the benefits and limitations of each option. Specifically, we discussed the conditions for which each test screens, the detection rates, and false positive rates of each. They were also counseled regarding diagnostic testing via amniocentesis. We reviewed the approximate 1 in 300-500 risk for complications for amniocentesis, including spontaneous pregnancy loss.   After consideration of all the options, they elected to proceed with NIPS (Panorama) at the time of today's visit.  Those results  will be available in 8-10 days. Ms. Melanie Galloway stated that she would further consider amniocentesis pending the results of NIPS and/or targeted ultrasound. The patient also expressed interest in having a detailed ultrasound, which was scheduled for 07/19/13. They understand that screening tests cannot rule out all birth defects or genetic syndromes.  Ms. Melanie Galloway was provided with written information regarding cystic fibrosis (CF) including the carrier frequency and incidence in the Caucasian population, the availability of carrier testing and prenatal diagnosis if indicated.  In addition, we discussed that CF is routinely screened for as part of the [facility] newborn screening panel.  She declined testing today.   Both family histories were reviewed and found to be contributory for cleft lip for the patient's maternal great-aunt (her maternal grandfather's sister). She reported that this individual had surgical correction and is otherwise healthy. No additional relatives were reported with cleft lip. We discussed that cleft lip +/- cleft palate can be syndromic or isolated.  If the patient's relative has a syndromic form of clefting, the chance of having an affected child depends on the inheritance pattern of that condition.  If the patient's relative has an isolated form of clefting, we discussed the probable multifactorial inheritance and explained that genetic testing for isolated cleft lip +/- cleft palate is not currently available.  Based on the family history, this couple's chance to have a baby with an isolated cleft lip +/- cleft palate is not expected to be increased above the general population risk, given the degree of relation and in the case of multifactorial inheritance. Without further information regarding the provided family history, an accurate genetic risk cannot be calculated. Further genetic counseling is warranted if more information is obtained.  Ms. Melanie Galloway denied exposure to  environmental toxins or chemical agents. She denied the use of alcohol or tobacco. She reported smoking marijuana previously in the pregnancy and reported no exposure since approximately 3 weeks ago. Prior to reviewing the specific exposures, we reviewed the general principles of teratogenic agents. Every pregnancy carries the risk for congenital anomalies of approximately 3-5%. To show that an agent is teratogenic, the rate of abnormalities for exposed pregnancies must be greater than that expected due to the background risk.  The dosage and timing of an exposure are also very important, with the first trimester of pregnancy being the most critical. We reviewed that available data regarding recreational use of marijuana in pregnancy have not indicated an association with increased risk for birth defects. Some studies have indicated a possible association with prenatal marijuana use and decreased fetal growth. Given this association, we discussed that no marijuana use in pregnancy is recommended. Given the reported amount of marijuana exposure and the timing of reported discontinued use, the risk for associated effects in the current pregnancy is likely low. She denied significant viral illnesses during the course of her pregnancy. Her medical and surgical histories were noncontributory.    I counseled this couple for approximately 40 minutes regarding the above risks and available options.   Quinn PlowmanKaren Ahmya Bernick, MS,  Certified Genetic Counselor 06/13/2013

## 2013-06-20 ENCOUNTER — Telehealth (HOSPITAL_COMMUNITY): Payer: Self-pay | Admitting: MS"

## 2013-06-20 NOTE — Telephone Encounter (Signed)
Called Melanie Galloway to discuss her cell free fetal DNA test results.  Mrs. Melanie Galloway had Panorama testing through [facility].  Testing was offered because of an increased Down syndrome risk.   The patient was identified by name and DOB.  We reviewed that these are within normal limits, showing a less than 1 in 10,000 risk for trisomies 21, 18 and 13, and monosomy X (Turner syndrome).  In addition, the risk for triploidy/vanishing twin and sex chromosome trisomies (47,XXX and 47,XXY) was also low risk.    We reviewed that this testing identifies > 99% of pregnancies with trisomy 4321, trisomy 4713, sex chromosome trisomies (47,XXX and 47,XXY), and triploidy. The detection rate for trisomy 18 is 96%.  The detection rate for monosomy X is ~92%.  The false positive rate is <0.1% for all conditions. Testing was also consistent with female gender.  The patient did not wish to know gender.  She understands that this testing does not identify all genetic conditions.  All questions were answered to her satisfaction, she was encouraged to call with additional questions or concerns.  Mady Gemmaaragh Conrad, MS Certified Genetic Counselor

## 2013-06-26 ENCOUNTER — Encounter (HOSPITAL_COMMUNITY): Payer: Self-pay

## 2013-06-26 ENCOUNTER — Inpatient Hospital Stay (HOSPITAL_COMMUNITY)
Admission: AD | Admit: 2013-06-26 | Discharge: 2013-06-26 | Disposition: A | Discharge status: Home / Self Care | Payer: BC Managed Care – PPO | Source: Ambulatory Visit | Attending: Obstetrics & Gynecology | Admitting: Obstetrics & Gynecology

## 2013-06-26 DIAGNOSIS — O26892 Other specified pregnancy related conditions, second trimester: Secondary | ICD-10-CM

## 2013-06-26 DIAGNOSIS — O99891 Other specified diseases and conditions complicating pregnancy: Secondary | ICD-10-CM | POA: Insufficient documentation

## 2013-06-26 DIAGNOSIS — O9989 Other specified diseases and conditions complicating pregnancy, childbirth and the puerperium: Secondary | ICD-10-CM

## 2013-06-26 DIAGNOSIS — R51 Headache: Secondary | ICD-10-CM | POA: Insufficient documentation

## 2013-06-26 HISTORY — DX: Migraine, unspecified, not intractable, without status migrainosus: G43.909

## 2013-06-26 HISTORY — DX: Unspecified asthma, uncomplicated: J45.909

## 2013-06-26 MED ORDER — BUTALBITAL-APAP-CAFFEINE 50-325-40 MG PO TABS
2.0000 | ORAL_TABLET | Freq: Once | ORAL | Status: AC
  Administered 2013-06-26: 2 via ORAL
  Filled 2013-06-26: qty 2

## 2013-06-26 MED ORDER — BUTALBITAL-APAP-CAFFEINE 50-325-40 MG PO TABS: 1.0000 | ORAL_TABLET | Freq: Four times a day (QID) | ORAL | Status: DC | PRN

## 2013-06-26 NOTE — MAU Note (Signed)
Pt states worsening headaches over the last 6 months that are getting "unbearably" worse since becoming pregnant. Feels like a ton of pressure in her head. Denies vaginal bleeding/discharge/abdominal pain.

## 2013-06-26 NOTE — MAU Provider Note (Signed)
History     CSN: [id number]  Arrival date and time: 06/26/13 4098   First Provider Initiated Contact with Patient 06/26/13 [phone]      Chief Complaint  Patient presents with  . Headache   HPI  Melanie Galloway is a 23 y.o. G1P0 at [redacted]w[redacted]d who presents today with a headache. She states that she has had headaches for one year, and they have gotten worse during the pregnancy. She states that she has been seen in [facility] for this complaint, and "they don't know what is wrong with me". She has not been prescribed any migraine medications. She has tried tylenol, and pain medicine, but neither have helped. She last took tylenol at 0900. She states that she took Unisom about 3-4 days ago, and it did not help her pain. She rates her pain 9/10. Denies nausea/vomiting. She reports sensitivity to light and sound.   Past Medical History  Diagnosis Date  . Stomach ulcer   . Migraines   . Asthma     Past Surgical History  Procedure Laterality Date  . Eye muscle surgery    . External ear surgery      Family History  Problem Relation Age of Onset  . Cancer Mother   . Asthma Sister     History  Substance Use Topics  . Smoking status: Former Smoker    Types: Cigarettes    Quit date: 04/26/2013  . Smokeless tobacco: Never Used  . Alcohol Use: No    Allergies: No Known Allergies  Prescriptions prior to admission  Medication Sig Dispense Refill  . cephALEXin (KEFLEX) 500 MG capsule Take 1 capsule (500 mg total) by mouth 4 (four) times daily.  28 capsule  0  . Prenatal Vit-Fe Fumarate-FA (PRENATAL MULTIVITAMIN) TABS tablet Take 1 tablet by mouth daily at 12 noon.      Marland Kitchen albuterol (PROVENTIL HFA;VENTOLIN HFA) 108 (90 BASE) MCG/ACT inhaler Inhale 2 puffs into the lungs every 6 (six) hours as needed for wheezing.        ROS Physical Exam   Blood pressure 116/47, pulse 71, temperature 98.7 F (37.1 C), temperature source Oral, resp. rate 18, height 5' 4.5" (1.638 m), weight 51.529 kg (113  lb 9.6 oz), last menstrual period 02/25/2013, SpO2 100.00%.  Physical Exam  Nursing note and vitals reviewed. Constitutional: She is oriented to person, place, and time. She appears well-developed and well-nourished. No distress.  Cardiovascular: Normal rate.   Respiratory: Effort normal.  GI: Soft. There is no tenderness.  FHT with doppler   Neurological: She is alert and oriented to person, place, and time.  Skin: Skin is warm and dry.  Psychiatric: She has a normal mood and affect.    MAU Course  Procedures  0329: patient reports pain is now 4/10  Assessment and Plan   1. Pregnancy headache in second trimester    Danger signs reviewed Return to MAU as needed  Follow-up Information   Follow up with [facility]. (as planned )    Specialty:  Obstetrics and Gynecology   Contact information:   [address] [place] [address] [phone]        Medication List         albuterol 108 (90 BASE) MCG/ACT inhaler  Commonly known as:  PROVENTIL HFA;VENTOLIN HFA  Inhale 2 puffs into the lungs every 6 (six) hours as needed for wheezing.     butalbital-acetaminophen-caffeine 50-325-40 MG per tablet  Commonly known as:  FIORICET  Take 1-2 tablets by mouth every 6 (six) hours as needed for headache.     cephALEXin 500 MG capsule  Commonly known as:  KEFLEX  Take 1 capsule (500 mg total) by mouth 4 (four) times daily.     prenatal multivitamin Tabs tablet  Take 1 tablet by mouth daily at 12 noon.         Tawnya CrookHogan, Heather Donovan 06/26/2013, 2:13 AM

## 2013-06-26 NOTE — Discharge Instructions (Signed)
Second Trimester of Pregnancy The second trimester is from week 13 through week 28, months 4 through 6. The second trimester is often a time when you feel your best. Your body has also adjusted to being pregnant, and you begin to feel better physically. Usually, morning sickness has lessened or quit completely, you may have more energy, and you may have an increase in appetite. The second trimester is also a time when the fetus is growing rapidly. At the end of the sixth month, the fetus is about 9 inches long and weighs about 1 pounds. You will likely begin to feel the baby move (quickening) between 18 and 20 weeks of the pregnancy. BODY CHANGES Your body goes through many changes during pregnancy. The changes vary from woman to woman.   Your weight will continue to increase. You will notice your lower abdomen bulging out.  You may begin to get stretch marks on your hips, abdomen, and breasts.  You may develop headaches that can be relieved by medicines approved by your caregiver.  You may urinate more often because the fetus is pressing on your bladder.  You may develop or continue to have heartburn as a result of your pregnancy.  You may develop constipation because certain hormones are causing the muscles that push waste through your intestines to slow down.  You may develop hemorrhoids or swollen, bulging veins (varicose veins).  You may have back pain because of the weight gain and pregnancy hormones relaxing your joints between the bones in your pelvis and as a result of a shift in weight and the muscles that support your balance.  Your breasts will continue to grow and be tender.  Your gums may bleed and may be sensitive to brushing and flossing.  Dark spots or blotches (chloasma, mask of pregnancy) may develop on your face. This will likely fade after the baby is born.  A dark line from your belly button to the pubic area (linea nigra) may appear. This will likely fade after the  baby is born. WHAT TO EXPECT AT YOUR PRENATAL VISITS During a routine prenatal visit:  You will be weighed to make sure you and the fetus are growing normally.  Your blood pressure will be taken.  Your abdomen will be measured to track your baby's growth.  The fetal heartbeat will be listened to.  Any test results from the previous visit will be discussed. Your caregiver may ask you:  How you are feeling.  If you are feeling the baby move.  If you have had any abnormal symptoms, such as leaking fluid, bleeding, severe headaches, or abdominal cramping.  If you have any questions. Other tests that may be performed during your second trimester include:  Blood tests that check for:  Low iron levels (anemia).  Gestational diabetes (between 24 and 28 weeks).  Rh antibodies.  Urine tests to check for infections, diabetes, or protein in the urine.  An ultrasound to confirm the proper growth and development of the baby.  An amniocentesis to check for possible genetic problems.  Fetal screens for spina bifida and Down syndrome. HOME CARE INSTRUCTIONS   Avoid all smoking, herbs, alcohol, and unprescribed drugs. These chemicals affect the formation and growth of the baby.  Follow your caregiver's instructions regarding medicine use. There are medicines that are either safe or unsafe to take during pregnancy.  Exercise only as directed by your caregiver. Experiencing uterine cramps is a good sign to stop exercising.  Continue to eat regular,   healthy meals.  Wear a good support bra for breast tenderness.  Do not use hot tubs, steam rooms, or saunas.  Wear your seat belt at all times when driving.  Avoid raw meat, uncooked cheese, cat litter boxes, and soil used by cats. These carry germs that can cause birth defects in the baby.  Take your prenatal vitamins.  Try taking a stool softener (if your caregiver approves) if you develop constipation. Eat more high-fiber foods,  such as fresh vegetables or fruit and whole grains. Drink plenty of fluids to keep your urine clear or pale yellow.  Take warm sitz baths to soothe any pain or discomfort caused by hemorrhoids. Use hemorrhoid cream if your caregiver approves.  If you develop varicose veins, wear support hose. Elevate your feet for 15 minutes, 3 4 times a day. Limit salt in your diet.  Avoid heavy lifting, wear low heel shoes, and practice good posture.  Rest with your legs elevated if you have leg cramps or low back pain.  Visit your dentist if you have not gone yet during your pregnancy. Use a soft toothbrush to brush your teeth and be gentle when you floss.  A sexual relationship may be continued unless your caregiver directs you otherwise.  Continue to go to all your prenatal visits as directed by your caregiver. SEEK MEDICAL CARE IF:   You have dizziness.  You have mild pelvic cramps, pelvic pressure, or nagging pain in the abdominal area.  You have persistent nausea, vomiting, or diarrhea.  You have a bad smelling vaginal discharge.  You have pain with urination. SEEK IMMEDIATE MEDICAL CARE IF:   You have a fever.  You are leaking fluid from your vagina.  You have spotting or bleeding from your vagina.  You have severe abdominal cramping or pain.  You have rapid weight gain or loss.  You have shortness of breath with chest pain.  You notice sudden or extreme swelling of your face, hands, ankles, feet, or legs.  You have not felt your baby move in over an hour.  You have severe headaches that do not go away with medicine.  You have vision changes. Document Released: 05/19/2001 Document Revised: 01/25/2013 Document Reviewed: 07/26/2012 ExitCare Patient Information 2014 [employer].  

## 2013-07-04 ENCOUNTER — Ambulatory Visit (INDEPENDENT_AMBULATORY_CARE_PROVIDER_SITE_OTHER): Payer: BC Managed Care – PPO | Admitting: Obstetrics and Gynecology

## 2013-07-04 VITALS — BP 114/62 | Temp 97.1°F | Wt 111.9 lb

## 2013-07-04 DIAGNOSIS — N390 Urinary tract infection, site not specified: Secondary | ICD-10-CM

## 2013-07-04 DIAGNOSIS — O2390 Unspecified genitourinary tract infection in pregnancy, unspecified trimester: Secondary | ICD-10-CM

## 2013-07-04 DIAGNOSIS — O2341 Unspecified infection of urinary tract in pregnancy, first trimester: Secondary | ICD-10-CM

## 2013-07-04 DIAGNOSIS — Z3400 Encounter for supervision of normal first pregnancy, unspecified trimester: Secondary | ICD-10-CM

## 2013-07-04 DIAGNOSIS — R519 Headache, unspecified: Secondary | ICD-10-CM | POA: Insufficient documentation

## 2013-07-04 DIAGNOSIS — R51 Headache: Secondary | ICD-10-CM

## 2013-07-04 LAB — POCT URINALYSIS DIP (DEVICE)
BILIRUBIN URINE: NEGATIVE
Glucose, UA: NEGATIVE mg/dL
Hgb urine dipstick: NEGATIVE
KETONES UR: NEGATIVE mg/dL
Nitrite: NEGATIVE
Protein, ur: NEGATIVE mg/dL
Urobilinogen, UA: 0.2 mg/dL (ref 0.0–1.0)
pH: 5.5 (ref 5.0–8.0)

## 2013-07-04 MED ORDER — BUTALBITAL-APAP-CAFFEINE 50-325-40 MG PO TABS: 1.0000 | ORAL_TABLET | Freq: Four times a day (QID) | ORAL | Status: DC | PRN

## 2013-07-04 MED ORDER — FLUCONAZOLE 150 MG PO TABS: 150.0000 mg | ORAL_TABLET | Freq: Once | ORAL | Status: DC

## 2013-07-04 NOTE — Patient Instructions (Signed)
Second Trimester of Pregnancy The second trimester is from week 13 through week 28, months 4 through 6. The second trimester is often a time when you feel your best. Your body has also adjusted to being pregnant, and you begin to feel better physically. Usually, morning sickness has lessened or quit completely, you may have more energy, and you may have an increase in appetite. The second trimester is also a time when the fetus is growing rapidly. At the end of the sixth month, the fetus is about 9 inches long and weighs about 1 pounds. You will likely begin to feel the baby move (quickening) between 18 and 20 weeks of the pregnancy. BODY CHANGES Your body goes through many changes during pregnancy. The changes vary from woman to woman.   Your weight will continue to increase. You will notice your lower abdomen bulging out.  You may begin to get stretch marks on your hips, abdomen, and breasts.  You may develop headaches that can be relieved by medicines approved by your caregiver.  You may urinate more often because the fetus is pressing on your bladder.  You may develop or continue to have heartburn as a result of your pregnancy.  You may develop constipation because certain hormones are causing the muscles that push waste through your intestines to slow down.  You may develop hemorrhoids or swollen, bulging veins (varicose veins).  You may have back pain because of the weight gain and pregnancy hormones relaxing your joints between the bones in your pelvis and as a result of a shift in weight and the muscles that support your balance.  Your breasts will continue to grow and be tender.  Your gums may bleed and may be sensitive to brushing and flossing.  Dark spots or blotches (chloasma, mask of pregnancy) may develop on your face. This will likely fade after the baby is born.  A dark line from your belly button to the pubic area (linea nigra) may appear. This will likely fade after the  baby is born. WHAT TO EXPECT AT YOUR PRENATAL VISITS During a routine prenatal visit:  You will be weighed to make sure you and the fetus are growing normally.  Your blood pressure will be taken.  Your abdomen will be measured to track your baby's growth.  The fetal heartbeat will be listened to.  Any test results from the previous visit will be discussed. Your caregiver may ask you:  How you are feeling.  If you are feeling the baby move.  If you have had any abnormal symptoms, such as leaking fluid, bleeding, severe headaches, or abdominal cramping.  If you have any questions. Other tests that may be performed during your second trimester include:  Blood tests that check for:  Low iron levels (anemia).  Gestational diabetes (between 24 and 28 weeks).  Rh antibodies.  Urine tests to check for infections, diabetes, or protein in the urine.  An ultrasound to confirm the proper growth and development of the baby.  An amniocentesis to check for possible genetic problems.  Fetal screens for spina bifida and Down syndrome. HOME CARE INSTRUCTIONS   Avoid all smoking, herbs, alcohol, and unprescribed drugs. These chemicals affect the formation and growth of the baby.  Follow your caregiver's instructions regarding medicine use. There are medicines that are either safe or unsafe to take during pregnancy.  Exercise only as directed by your caregiver. Experiencing uterine cramps is a good sign to stop exercising.  Continue to eat regular,   healthy meals.  Wear a good support bra for breast tenderness.  Do not use hot tubs, steam rooms, or saunas.  Wear your seat belt at all times when driving.  Avoid raw meat, uncooked cheese, cat litter boxes, and soil used by cats. These carry germs that can cause birth defects in the baby.  Take your prenatal vitamins.  Try taking a stool softener (if your caregiver approves) if you develop constipation. Eat more high-fiber foods,  such as fresh vegetables or fruit and whole grains. Drink plenty of fluids to keep your urine clear or pale yellow.  Take warm sitz baths to soothe any pain or discomfort caused by hemorrhoids. Use hemorrhoid cream if your caregiver approves.  If you develop varicose veins, wear support hose. Elevate your feet for 15 minutes, 3 4 times a day. Limit salt in your diet.  Avoid heavy lifting, wear low heel shoes, and practice good posture.  Rest with your legs elevated if you have leg cramps or low back pain.  Visit your dentist if you have not gone yet during your pregnancy. Use a soft toothbrush to brush your teeth and be gentle when you floss.  A sexual relationship may be continued unless your caregiver directs you otherwise.  Continue to go to all your prenatal visits as directed by your caregiver. SEEK MEDICAL CARE IF:   You have dizziness.  You have mild pelvic cramps, pelvic pressure, or nagging pain in the abdominal area.  You have persistent nausea, vomiting, or diarrhea.  You have a bad smelling vaginal discharge.  You have pain with urination. SEEK IMMEDIATE MEDICAL CARE IF:   You have a fever.  You are leaking fluid from your vagina.  You have spotting or bleeding from your vagina.  You have severe abdominal cramping or pain.  You have rapid weight gain or loss.  You have shortness of breath with chest pain.  You notice sudden or extreme swelling of your face, hands, ankles, feet, or legs.  You have not felt your baby move in over an hour.  You have severe headaches that do not go away with medicine.  You have vision changes. Document Released: 05/19/2001 Document Revised: 01/25/2013 Document Reviewed: 07/26/2012 ExitCare Patient Information 2014 [employer].  

## 2013-07-04 NOTE — Progress Notes (Signed)
P= 82 Requests refill of Fioricet.  MAU told pt. She needed to schedule a neurology consult for headaches.  Pt. States "It feels like my UTI has gotten worse since starting keflex. It hurts more for me to pee."

## 2013-07-04 NOTE — Progress Notes (Signed)
Vaginal and vulva  itch and thick white discharge. 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 Rx Diflucan. Chronic tension headaches (seen MAU last wk and given Fiorocet which helps); discussed> refer to Jannifer RodneyLinda Barefoot, NP.

## 2013-07-18 ENCOUNTER — Other Ambulatory Visit: Payer: Self-pay | Admitting: Family Medicine

## 2013-07-18 DIAGNOSIS — O280 Abnormal hematological finding on antenatal screening of mother: Secondary | ICD-10-CM

## 2013-07-18 DIAGNOSIS — IMO0002 Reserved for concepts with insufficient information to code with codable children: Secondary | ICD-10-CM

## 2013-07-18 DIAGNOSIS — Z0489 Encounter for examination and observation for other specified reasons: Secondary | ICD-10-CM

## 2013-07-19 ENCOUNTER — Ambulatory Visit (HOSPITAL_COMMUNITY)
Admission: RE | Admit: 2013-07-19 | Discharge: 2013-07-19 | Disposition: A | Discharge status: Home / Self Care | Payer: BC Managed Care – PPO | Source: Ambulatory Visit | Attending: Obstetrics and Gynecology | Admitting: Obstetrics and Gynecology

## 2013-07-19 ENCOUNTER — Other Ambulatory Visit: Payer: Self-pay

## 2013-07-19 DIAGNOSIS — O280 Abnormal hematological finding on antenatal screening of mother: Secondary | ICD-10-CM

## 2013-07-19 DIAGNOSIS — Z1389 Encounter for screening for other disorder: Secondary | ICD-10-CM | POA: Insufficient documentation

## 2013-07-19 DIAGNOSIS — O289 Unspecified abnormal findings on antenatal screening of mother: Secondary | ICD-10-CM | POA: Insufficient documentation

## 2013-07-19 DIAGNOSIS — IMO0002 Reserved for concepts with insufficient information to code with codable children: Secondary | ICD-10-CM

## 2013-07-19 DIAGNOSIS — Z0489 Encounter for examination and observation for other specified reasons: Secondary | ICD-10-CM

## 2013-07-19 DIAGNOSIS — Z363 Encounter for antenatal screening for malformations: Secondary | ICD-10-CM | POA: Insufficient documentation

## 2013-07-19 DIAGNOSIS — O358XX0 Maternal care for other (suspected) fetal abnormality and damage, not applicable or unspecified: Secondary | ICD-10-CM | POA: Insufficient documentation

## 2013-07-19 MED ORDER — BUTALBITAL-APAP-CAFFEINE 50-325-40 MG PO TABS: 1.0000 | ORAL_TABLET | Freq: Four times a day (QID) | ORAL | Status: DC | PRN

## 2013-07-19 NOTE — Telephone Encounter (Signed)
Pt came to the front desk and requested another refill on her medication for headache.  Per Leola Brazileidre Poe, CNM pt can have another refill and we will refer to the headache clinic with Jannifer RodneyLinda Barefoot, NP.  I informed pt that we would call with a an appt for her chronic headaches.  Pt stated understanding.

## 2013-07-20 ENCOUNTER — Encounter: Payer: Self-pay | Admitting: Family Medicine

## 2013-07-24 ENCOUNTER — Ambulatory Visit: Payer: Self-pay | Admitting: Neurology

## 2013-08-01 ENCOUNTER — Ambulatory Visit (INDEPENDENT_AMBULATORY_CARE_PROVIDER_SITE_OTHER): Payer: BC Managed Care – PPO | Admitting: Family Medicine

## 2013-08-01 ENCOUNTER — Encounter: Payer: Self-pay | Admitting: Family Medicine

## 2013-08-01 VITALS — BP 117/66 | Temp 98.6°F | Wt 123.7 lb

## 2013-08-01 DIAGNOSIS — O280 Abnormal hematological finding on antenatal screening of mother: Secondary | ICD-10-CM

## 2013-08-01 DIAGNOSIS — O289 Unspecified abnormal findings on antenatal screening of mother: Secondary | ICD-10-CM

## 2013-08-01 DIAGNOSIS — Z3400 Encounter for supervision of normal first pregnancy, unspecified trimester: Secondary | ICD-10-CM

## 2013-08-01 LAB — POCT URINALYSIS DIP (DEVICE)
BILIRUBIN URINE: NEGATIVE
GLUCOSE, UA: 100 mg/dL — AB
HGB URINE DIPSTICK: NEGATIVE
KETONES UR: NEGATIVE mg/dL
Leukocytes, UA: NEGATIVE
Nitrite: NEGATIVE
Protein, ur: NEGATIVE mg/dL
SPECIFIC GRAVITY, URINE: 1.02 (ref 1.005–1.030)
Urobilinogen, UA: 0.2 mg/dL (ref 0.0–1.0)
pH: 7 (ref 5.0–8.0)

## 2013-08-01 MED ORDER — BUTALBITAL-APAP-CAFFEINE 50-325-40 MG PO TABS: 2.0000 | ORAL_TABLET | Freq: Four times a day (QID) | ORAL | Status: DC | PRN

## 2013-08-01 NOTE — Progress Notes (Signed)
p=87 pt c/o of bad headaches and was told she would have a referral to headache specialist, needs refill on headache prescription.

## 2013-08-01 NOTE — Progress Notes (Signed)
S: 23 yo G1 @ 3927w4d here for ROBV - has HA, was supposed to see linda barefoot but not referred - needs refill on fiorecet - no vb, lof, ctx. +FM  O: see flowsheet  A/P - re-referred to Jannifer RodneyLinda Barefoot for headaches - refilled fiorecet - reviewed US. Normal. Will need 3rd trimester for growth due to abnormal PAPPA protein - glucose on UA. Will re-eval next time

## 2013-08-01 NOTE — Patient Instructions (Signed)
Second Trimester of Pregnancy The second trimester is from week 13 through week 28, months 4 through 6. The second trimester is often a time when you feel your best. Your body has also adjusted to being pregnant, and you begin to feel better physically. Usually, morning sickness has lessened or quit completely, you may have more energy, and you may have an increase in appetite. The second trimester is also a time when the fetus is growing rapidly. At the end of the sixth month, the fetus is about 9 inches long and weighs about 1 pounds. You will likely begin to feel the baby move (quickening) between 18 and 20 weeks of the pregnancy. BODY CHANGES Your body goes through many changes during pregnancy. The changes vary from woman to woman.   Your weight will continue to increase. You will notice your lower abdomen bulging out.  You may begin to get stretch marks on your hips, abdomen, and breasts.  You may develop headaches that can be relieved by medicines approved by your caregiver.  You may urinate more often because the fetus is pressing on your bladder.  You may develop or continue to have heartburn as a result of your pregnancy.  You may develop constipation because certain hormones are causing the muscles that push waste through your intestines to slow down.  You may develop hemorrhoids or swollen, bulging veins (varicose veins).  You may have back pain because of the weight gain and pregnancy hormones relaxing your joints between the bones in your pelvis and as a result of a shift in weight and the muscles that support your balance.  Your breasts will continue to grow and be tender.  Your gums may bleed and may be sensitive to brushing and flossing.  Dark spots or blotches (chloasma, mask of pregnancy) may develop on your face. This will likely fade after the baby is born.  A dark line from your belly button to the pubic area (linea nigra) may appear. This will likely fade after the  baby is born. WHAT TO EXPECT AT YOUR PRENATAL VISITS During a routine prenatal visit:  You will be weighed to make sure you and the fetus are growing normally.  Your blood pressure will be taken.  Your abdomen will be measured to track your baby's growth.  The fetal heartbeat will be listened to.  Any test results from the previous visit will be discussed. Your caregiver may ask you:  How you are feeling.  If you are feeling the baby move.  If you have had any abnormal symptoms, such as leaking fluid, bleeding, severe headaches, or abdominal cramping.  If you have any questions. Other tests that may be performed during your second trimester include:  Blood tests that check for:  Low iron levels (anemia).  Gestational diabetes (between 24 and 28 weeks).  Rh antibodies.  Urine tests to check for infections, diabetes, or protein in the urine.  An ultrasound to confirm the proper growth and development of the baby.  An amniocentesis to check for possible genetic problems.  Fetal screens for spina bifida and Down syndrome. HOME CARE INSTRUCTIONS   Avoid all smoking, herbs, alcohol, and unprescribed drugs. These chemicals affect the formation and growth of the baby.  Follow your caregiver's instructions regarding medicine use. There are medicines that are either safe or unsafe to take during pregnancy.  Exercise only as directed by your caregiver. Experiencing uterine cramps is a good sign to stop exercising.  Continue to eat regular,   healthy meals.  Wear a good support bra for breast tenderness.  Do not use hot tubs, steam rooms, or saunas.  Wear your seat belt at all times when driving.  Avoid raw meat, uncooked cheese, cat litter boxes, and soil used by cats. These carry germs that can cause birth defects in the baby.  Take your prenatal vitamins.  Try taking a stool softener (if your caregiver approves) if you develop constipation. Eat more high-fiber foods,  such as fresh vegetables or fruit and whole grains. Drink plenty of fluids to keep your urine clear or pale yellow.  Take warm sitz baths to soothe any pain or discomfort caused by hemorrhoids. Use hemorrhoid cream if your caregiver approves.  If you develop varicose veins, wear support hose. Elevate your feet for 15 minutes, 3 4 times a day. Limit salt in your diet.  Avoid heavy lifting, wear low heel shoes, and practice good posture.  Rest with your legs elevated if you have leg cramps or low back pain.  Visit your dentist if you have not gone yet during your pregnancy. Use a soft toothbrush to brush your teeth and be gentle when you floss.  A sexual relationship may be continued unless your caregiver directs you otherwise.  Continue to go to all your prenatal visits as directed by your caregiver. SEEK MEDICAL CARE IF:   You have dizziness.  You have mild pelvic cramps, pelvic pressure, or nagging pain in the abdominal area.  You have persistent nausea, vomiting, or diarrhea.  You have a bad smelling vaginal discharge.  You have pain with urination. SEEK IMMEDIATE MEDICAL CARE IF:   You have a fever.  You are leaking fluid from your vagina.  You have spotting or bleeding from your vagina.  You have severe abdominal cramping or pain.  You have rapid weight gain or loss.  You have shortness of breath with chest pain.  You notice sudden or extreme swelling of your face, hands, ankles, feet, or legs.  You have not felt your baby move in over an hour.  You have severe headaches that do not go away with medicine.  You have vision changes. Document Released: 05/19/2001 Document Revised: 01/25/2013 Document Reviewed: 07/26/2012 ExitCare Patient Information 2014 [employer].  

## 2013-08-02 NOTE — Progress Notes (Signed)
Called pt and informed [facility] of [facility] with Jannifer RodneyLinda Barefoot.  Appt March 17th @ 115pm.  I gave address and contact telephone # to [facility].  Pt stated thank you.

## 2013-08-29 ENCOUNTER — Ambulatory Visit (INDEPENDENT_AMBULATORY_CARE_PROVIDER_SITE_OTHER): Payer: BC Managed Care – PPO | Admitting: Advanced Practice Midwife

## 2013-08-29 ENCOUNTER — Encounter: Payer: Self-pay | Admitting: Advanced Practice Midwife

## 2013-08-29 VITALS — BP 122/55 | Temp 97.0°F | Wt 121.6 lb

## 2013-08-29 DIAGNOSIS — J45909 Unspecified asthma, uncomplicated: Secondary | ICD-10-CM

## 2013-08-29 DIAGNOSIS — O99891 Other specified diseases and conditions complicating pregnancy: Secondary | ICD-10-CM

## 2013-08-29 DIAGNOSIS — O99519 Diseases of the respiratory system complicating pregnancy, unspecified trimester: Principal | ICD-10-CM

## 2013-08-29 DIAGNOSIS — O9989 Other specified diseases and conditions complicating pregnancy, childbirth and the puerperium: Secondary | ICD-10-CM

## 2013-08-29 LAB — POCT URINALYSIS DIP (DEVICE)
Bilirubin Urine: NEGATIVE
Glucose, UA: NEGATIVE mg/dL
Hgb urine dipstick: NEGATIVE
Ketones, ur: NEGATIVE mg/dL
Leukocytes, UA: NEGATIVE
Nitrite: NEGATIVE
Protein, ur: NEGATIVE mg/dL
SPECIFIC GRAVITY, URINE: 1.02 (ref 1.005–1.030)
UROBILINOGEN UA: 0.2 mg/dL (ref 0.0–1.0)
pH: 7.5 (ref 5.0–8.0)

## 2013-08-29 NOTE — Progress Notes (Signed)
Pulse- 81 Edema-feet Pt would like a letter indicating that she can have breaks due to being on feet for 12 hours working in [employer] and not being able to take breaks

## 2013-08-29 NOTE — Patient Instructions (Signed)
You have appointment with Melanie RodneyLinda Galloway on Tuesday, March 31 at 10 am at [facility]:   [address], [place], [place] [id number] [phone]

## 2013-08-29 NOTE — Progress Notes (Signed)
Doing well.  Good fetal movement, denies vaginal bleeding, LOF, regular contractions. Discussed work in pregnancy, need to drink fluids, have breaks to put her feet up but can continue working her job.

## 2013-08-30 ENCOUNTER — Inpatient Hospital Stay (HOSPITAL_COMMUNITY)
Admission: AD | Admit: 2013-08-30 | Discharge: 2013-08-30 | Disposition: A | Discharge status: Home / Self Care | Payer: BC Managed Care – PPO | Source: Ambulatory Visit | Attending: Obstetrics & Gynecology | Admitting: Obstetrics & Gynecology

## 2013-08-30 ENCOUNTER — Encounter (HOSPITAL_COMMUNITY): Payer: Self-pay | Admitting: *Deleted

## 2013-08-30 DIAGNOSIS — Z87891 Personal history of nicotine dependence: Secondary | ICD-10-CM | POA: Insufficient documentation

## 2013-08-30 DIAGNOSIS — N949 Unspecified condition associated with female genital organs and menstrual cycle: Secondary | ICD-10-CM

## 2013-08-30 DIAGNOSIS — R109 Unspecified abdominal pain: Secondary | ICD-10-CM | POA: Insufficient documentation

## 2013-08-30 DIAGNOSIS — O4700 False labor before 37 completed weeks of gestation, unspecified trimester: Secondary | ICD-10-CM | POA: Insufficient documentation

## 2013-08-30 DIAGNOSIS — O479 False labor, unspecified: Secondary | ICD-10-CM

## 2013-08-30 LAB — URINALYSIS, ROUTINE W REFLEX MICROSCOPIC
Bilirubin Urine: NEGATIVE
GLUCOSE, UA: 100 mg/dL — AB
Hgb urine dipstick: NEGATIVE
KETONES UR: NEGATIVE mg/dL
LEUKOCYTES UA: NEGATIVE
Nitrite: NEGATIVE
PROTEIN: NEGATIVE mg/dL
Specific Gravity, Urine: 1.02 (ref 1.005–1.030)
UROBILINOGEN UA: 0.2 mg/dL (ref 0.0–1.0)
pH: 5.5 (ref 5.0–8.0)

## 2013-08-30 MED ORDER — ACETAMINOPHEN 325 MG PO TABS
650.0000 mg | ORAL_TABLET | Freq: Once | ORAL | Status: AC
  Administered 2013-08-30: 650 mg via ORAL
  Filled 2013-08-30: qty 2

## 2013-08-30 NOTE — Progress Notes (Signed)
Pt states she has pain when standing but not pain when not standing

## 2013-08-30 NOTE — Discharge Instructions (Signed)
Preterm Labor Information Preterm labor is when labor starts at less than 37 weeks of pregnancy. The normal length of a pregnancy is 39 to 41 weeks. CAUSES Often, there is no identifiable underlying cause as to why a woman goes into preterm labor. One of the most common known causes of preterm labor is infection. Infections of the uterus, cervix, vagina, amniotic sac, bladder, kidney, or even the lungs (pneumonia) can cause labor to start. Other suspected causes of preterm labor include:   Urogenital infections, such as yeast infections and bacterial vaginosis.   Uterine abnormalities (uterine shape, uterine septum, fibroids, or bleeding from the placenta).   A cervix that has been operated on (it may fail to stay closed).   Malformations in the fetus.   Multiple gestations (twins, triplets, and so on).   Breakage of the amniotic sac.  RISK FACTORS  Having a previous history of preterm labor.   Having premature rupture of membranes (PROM).   Having a placenta that covers the opening of the cervix (placenta previa).   Having a placenta that separates from the uterus (placental abruption).   Having a cervix that is too weak to hold the fetus in the uterus (incompetent cervix).   Having too much fluid in the amniotic sac (polyhydramnios).   Taking illegal drugs or smoking while pregnant.   Not gaining enough weight while pregnant.   Being younger than 35 and older than 21 years old.   Having a low socioeconomic status.   Being African American. SYMPTOMS Signs and symptoms of preterm labor include:   Menstrual-like cramps, abdominal pain, or back pain.  Uterine contractions that are regular, as frequent as six in an hour, regardless of their intensity (may be mild or painful).  Contractions that start on the top of the uterus and spread down to the lower abdomen and back.   A sense of increased pelvic pressure.   A watery or bloody mucus discharge  that comes from the vagina.  TREATMENT Depending on the length of the pregnancy and other circumstances, your health care provider may suggest bed rest. If necessary, there are medicines that can be given to stop contractions and to mature the fetal lungs. If labor happens before 34 weeks of pregnancy, a prolonged hospital stay may be recommended. Treatment depends on the condition of both you and the fetus.  WHAT SHOULD YOU DO IF YOU THINK YOU ARE IN PRETERM LABOR? Call your health care provider right away. You will need to go to the hospital to get checked immediately. HOW CAN YOU PREVENT PRETERM LABOR IN FUTURE PREGNANCIES? You should:   Stop smoking if you smoke.  Maintain healthy weight gain and avoid chemicals and drugs that are not necessary.  Be watchful for any type of infection.  Inform your health care provider if you have a known history of preterm labor. Document Released: 08/15/2003 Document Revised: 01/25/2013 Document Reviewed: 06/27/2012 [facility] Patient Information 2014 [place], [place].  Second Trimester of Pregnancy The second trimester is from week 13 through week 28, months 4 through 6. The second trimester is often a time when you feel your best. Your body has also adjusted to being pregnant, and you begin to feel better physically. Usually, morning sickness has lessened or quit completely, you may have more energy, and you may have an increase in appetite. The second trimester is also a time when the fetus is growing rapidly. At the end of the sixth month, the fetus is about 9 inches  long and weighs about 1 pounds. You will likely begin to feel the baby move (quickening) between 18 and 20 weeks of the pregnancy. BODY CHANGES Your body goes through many changes during pregnancy. The changes vary from woman to woman.   Your weight will continue to increase. You will notice your lower abdomen bulging out.  You may begin to get stretch marks on your hips, abdomen, and  breasts.  You may develop headaches that can be relieved by medicines approved by your caregiver.  You may urinate more often because the fetus is pressing on your bladder.  You may develop or continue to have heartburn as a result of your pregnancy.  You may develop constipation because certain hormones are causing the muscles that push waste through your intestines to slow down.  You may develop hemorrhoids or swollen, bulging veins (varicose veins).  You may have back pain because of the weight gain and pregnancy hormones relaxing your joints between the bones in your pelvis and as a result of a shift in weight and the muscles that support your balance.  Your breasts will continue to grow and be tender.  Your gums may bleed and may be sensitive to brushing and flossing.  Dark spots or blotches (chloasma, mask of pregnancy) may develop on your face. This will likely fade after the baby is born.  A dark line from your belly button to the pubic area (linea nigra) may appear. This will likely fade after the baby is born. WHAT TO EXPECT AT YOUR PRENATAL VISITS During a routine prenatal visit:  You will be weighed to make sure you and the fetus are growing normally.  Your blood pressure will be taken.  Your abdomen will be measured to track your baby's growth.  The fetal heartbeat will be listened to.  Any test results from the previous visit will be discussed. Your caregiver may ask you:  How you are feeling.  If you are feeling the baby move.  If you have had any abnormal symptoms, such as leaking fluid, bleeding, severe headaches, or abdominal cramping.  If you have any questions. Other tests that may be performed during your second trimester include:  Blood tests that check for:  Low iron levels (anemia).  Gestational diabetes (between 24 and 28 weeks).  Rh antibodies.  Urine tests to check for infections, diabetes, or protein in the urine.  An ultrasound to  confirm the proper growth and development of the baby.  An amniocentesis to check for possible genetic problems.  Fetal screens for spina bifida and Down syndrome. HOME CARE INSTRUCTIONS   Avoid all smoking, herbs, alcohol, and unprescribed drugs. These chemicals affect the formation and growth of the baby.  Follow your caregiver's instructions regarding medicine use. There are medicines that are either safe or unsafe to take during pregnancy.  Exercise only as directed by your caregiver. Experiencing uterine cramps is a good sign to stop exercising.  Continue to eat regular, healthy meals.  Wear a good support bra for breast tenderness.  Do not use hot tubs, steam rooms, or saunas.  Wear your seat belt at all times when driving.  Avoid raw meat, uncooked cheese, cat litter boxes, and soil used by cats. These carry germs that can cause birth defects in the baby.  Take your prenatal vitamins.  Try taking a stool softener (if your caregiver approves) if you develop constipation. Eat more high-fiber foods, such as fresh vegetables or fruit and whole grains. Drink plenty of  fluids to keep your urine clear or pale yellow.  Take warm sitz baths to soothe any pain or discomfort caused by hemorrhoids. Use hemorrhoid cream if your caregiver approves.  If you develop varicose veins, wear support hose. Elevate your feet for 15 minutes, 3 4 times a day. Limit salt in your diet.  Avoid heavy lifting, wear low heel shoes, and practice good posture.  Rest with your legs elevated if you have leg cramps or low back pain.  Visit your dentist if you have not gone yet during your pregnancy. Use a soft toothbrush to brush your teeth and be gentle when you floss.  A sexual relationship may be continued unless your caregiver directs you otherwise.  Continue to go to all your prenatal visits as directed by your caregiver. SEEK MEDICAL CARE IF:   You have dizziness.  You have mild pelvic cramps,  pelvic pressure, or nagging pain in the abdominal area.  You have persistent nausea, vomiting, or diarrhea.  You have a bad smelling vaginal discharge.  You have pain with urination. SEEK IMMEDIATE MEDICAL CARE IF:   You have a fever.  You are leaking fluid from your vagina.  You have spotting or bleeding from your vagina.  You have severe abdominal cramping or pain.  You have rapid weight gain or loss.  You have shortness of breath with chest pain.  You notice sudden or extreme swelling of your face, hands, ankles, feet, or legs.  You have not felt your baby move in over an hour.  You have severe headaches that do not go away with medicine.  You have vision changes. Document Released: 05/19/2001 Document Revised: 01/25/2013 Document Reviewed: 07/26/2012 [facility] Patient Information 2014 [place].

## 2013-08-30 NOTE — MAU Provider Note (Signed)
History     CSN: [id number]  Arrival date and time: 08/30/13 2116  First Provider Initiated Contact with Patient 08/30/2013 at 2335.  Chief Complaint  Patient presents with  . Abdominal Cramping   HPI  Pt is a 23 yo G1P0 currently pregnant female with a PMH of stomach ulcers presenting with abdominal cramping. This abdominal cramping began this morning, was consistent in nature, non-radiating, located on the lower part of her abdomen, and was exacerbated by walking. Laying flat has alleviated the pain completely. Pt has taken no pain medication for this. 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 Patient states that she has not been experiencing any vaginal bleeding, fluid leakage and has felt fetal movement. No dysuria, frequency or urgency. [facility]  Past Medical History  Diagnosis Date  . Stomach ulcer   . Migraines   . Asthma     Past Surgical History  Procedure Laterality Date  . Eye muscle surgery    . External ear surgery      Family History  Problem Relation Age of Onset  . Cancer Mother   . Asthma Sister     History  Substance Use Topics  . Smoking status: Former Smoker    Types: Cigarettes    Quit date: 04/26/2013  . Smokeless tobacco: Never Used  . Alcohol Use: No    Allergies: No Known Allergies  Prescriptions prior to admission  Medication Sig Dispense Refill  . butalbital-acetaminophen-caffeine (FIORICET) 50-325-40 MG per tablet Take 2 tablets by mouth every 6 (six) hours as needed for headache.  30 tablet  0  . Prenatal Vit-Fe Fumarate-FA (PRENATAL MULTIVITAMIN) TABS tablet Take 1 tablet by mouth daily at 12 noon.        Review of Systems  Constitutional: Negative for fever and chills.  Eyes: Negative for blurred vision.  Respiratory: Negative for shortness of breath.   Cardiovascular: Negative for chest pain.  Gastrointestinal: Positive for abdominal pain  (lower abdominal cramping). Negative for nausea and vomiting.  Genitourinary: Negative for dysuria, urgency and frequency.  Musculoskeletal: Negative for back pain.  Neurological: Negative for dizziness and headaches.   Physical Exam   Blood pressure 113/64, pulse 78, temperature 98.6 F (37 C), temperature source Oral, resp. rate 18, height 5' 3.6" (1.615 m), weight 56.518 kg (124 lb 9.6 oz), last menstrual period 02/25/2013.  Physical Exam  Constitutional: She is oriented to person, place, and time. No distress.  Cardiovascular: Normal rate and regular rhythm.   Respiratory: Effort normal and breath sounds normal. No respiratory distress.  GI: There is no tenderness. There is no rebound and no guarding.  Neurological: She is alert and oriented to person, place, and time.  Genitourinary: OS Closed, Cervix firm, no blood noticed on the glove  MAU Course  Procedures  MDM Results for orders placed during the hospital encounter of 08/30/13 (from the past 24 hour(s))  URINALYSIS, ROUTINE W REFLEX MICROSCOPIC     Status: Abnormal   Collection Time    08/30/13  9:55 PM      Result Value Ref Range   Color, Urine YELLOW  YELLOW   APPearance CLEAR  CLEAR   Specific Gravity, Urine 1.020  1.005 - 1.030   pH 5.5  5.0 - 8.0   Glucose, UA 100 (*) NEGATIVE mg/dL   Hgb urine dipstick NEGATIVE  NEGATIVE   Bilirubin Urine NEGATIVE  NEGATIVE   Ketones, ur NEGATIVE  NEGATIVE mg/dL   Protein, ur NEGATIVE  NEGATIVE mg/dL   Urobilinogen, UA 0.2  0.0 - 1.0 mg/dL   Nitrite NEGATIVE  NEGATIVE   Leukocytes, UA NEGATIVE  NEGATIVE   Tylenol  Assessment and Plan   1. Preterm contractions   2. Round ligament pain     Follow-up Information   Follow up with [facility] On 09/10/2013. (as scheduled)    Contact information:   [address] [place] [address]       Follow up with [facility]. (As needed, If symptoms worsen)    Contact  information:   [address] [id number] [place] [address] [phone]       Medication List    ASK your doctor about these medications       butalbital-acetaminophen-caffeine 50-325-40 MG per tablet  Commonly known as:  FIORICET  Take 2 tablets by mouth every 6 (six) hours as needed for headache.     prenatal multivitamin Tabs tablet  Take 1 tablet by mouth daily at 12 noon.       Unknown Foley T 08/30/2013, 10:25 PM   I was present for the exam and agree with above.  EFM: 140's moderate variability, pos accels, no decels. Toco: few mild UC's w/ UI. Pain 0/10 at rest.   Dorathy Kinsman, CNM 08/30/2013 11:45 PM

## 2013-08-30 NOTE — Progress Notes (Signed)
Pt stats cramping is a lot worse when she gets up and tries to walk

## 2013-08-30 NOTE — MAU Note (Signed)
Pt G1 at 24.5wks with cramping.  Denies bleeding or unusual discharge.

## 2013-08-30 NOTE — MAU Note (Signed)
Pt states cramping started about"3  I/2 hours ago"

## 2013-09-03 ENCOUNTER — Inpatient Hospital Stay (HOSPITAL_COMMUNITY)
Admission: AD | Admit: 2013-09-03 | Discharge: 2013-09-03 | Disposition: A | Discharge status: Home / Self Care | Payer: BC Managed Care – PPO | Source: Ambulatory Visit | Attending: Emergency Medicine | Admitting: Emergency Medicine

## 2013-09-03 ENCOUNTER — Encounter (HOSPITAL_COMMUNITY): Payer: Self-pay | Admitting: *Deleted

## 2013-09-03 ENCOUNTER — Inpatient Hospital Stay (HOSPITAL_COMMUNITY): Payer: BC Managed Care – PPO

## 2013-09-03 DIAGNOSIS — O2650 Maternal hypotension syndrome, unspecified trimester: Secondary | ICD-10-CM | POA: Insufficient documentation

## 2013-09-03 DIAGNOSIS — R55 Syncope and collapse: Secondary | ICD-10-CM

## 2013-09-03 DIAGNOSIS — Y9389 Activity, other specified: Secondary | ICD-10-CM | POA: Insufficient documentation

## 2013-09-03 DIAGNOSIS — O4700 False labor before 37 completed weeks of gestation, unspecified trimester: Secondary | ICD-10-CM | POA: Insufficient documentation

## 2013-09-03 DIAGNOSIS — Z87891 Personal history of nicotine dependence: Secondary | ICD-10-CM | POA: Insufficient documentation

## 2013-09-03 DIAGNOSIS — Y9229 Other specified public building as the place of occurrence of the external cause: Secondary | ICD-10-CM | POA: Insufficient documentation

## 2013-09-03 DIAGNOSIS — S060X9A Concussion with loss of consciousness of unspecified duration, initial encounter: Secondary | ICD-10-CM

## 2013-09-03 DIAGNOSIS — R4182 Altered mental status, unspecified: Secondary | ICD-10-CM | POA: Insufficient documentation

## 2013-09-03 DIAGNOSIS — O9989 Other specified diseases and conditions complicating pregnancy, childbirth and the puerperium: Secondary | ICD-10-CM

## 2013-09-03 DIAGNOSIS — M25569 Pain in unspecified knee: Secondary | ICD-10-CM | POA: Insufficient documentation

## 2013-09-03 DIAGNOSIS — O99891 Other specified diseases and conditions complicating pregnancy: Secondary | ICD-10-CM | POA: Insufficient documentation

## 2013-09-03 DIAGNOSIS — S060XAA Concussion with loss of consciousness status unknown, initial encounter: Secondary | ICD-10-CM

## 2013-09-03 DIAGNOSIS — W19XXXA Unspecified fall, initial encounter: Secondary | ICD-10-CM | POA: Insufficient documentation

## 2013-09-03 DIAGNOSIS — R51 Headache: Secondary | ICD-10-CM | POA: Insufficient documentation

## 2013-09-03 DIAGNOSIS — R040 Epistaxis: Secondary | ICD-10-CM | POA: Insufficient documentation

## 2013-09-03 LAB — CBC WITH DIFFERENTIAL/PLATELET
BASOS ABS: 0 10*3/uL (ref 0.0–0.1)
Basophils Relative: 0 % (ref 0–1)
Eosinophils Absolute: 0.1 10*3/uL (ref 0.0–0.7)
Eosinophils Relative: 1 % (ref 0–5)
HCT: 34.3 % — ABNORMAL LOW (ref 36.0–46.0)
Hemoglobin: 12.1 g/dL (ref 12.0–15.0)
LYMPHS PCT: 25 % (ref 12–46)
Lymphs Abs: 2.1 10*3/uL (ref 0.7–4.0)
MCH: 32.1 pg (ref 26.0–34.0)
MCHC: 35.3 g/dL (ref 30.0–36.0)
MCV: 91 fL (ref 78.0–100.0)
Monocytes Absolute: 0.5 10*3/uL (ref 0.1–1.0)
Monocytes Relative: 6 % (ref 3–12)
NEUTROS ABS: 5.7 10*3/uL (ref 1.7–7.7)
Neutrophils Relative %: 68 % (ref 43–77)
PLATELETS: 176 10*3/uL (ref 150–400)
RBC: 3.77 MIL/uL — ABNORMAL LOW (ref 3.87–5.11)
RDW: 13.2 % (ref 11.5–15.5)
WBC: 8.4 10*3/uL (ref 4.0–10.5)

## 2013-09-03 LAB — BASIC METABOLIC PANEL
BUN: 7 mg/dL (ref 6–23)
CALCIUM: 8.8 mg/dL (ref 8.4–10.5)
CHLORIDE: 102 meq/L (ref 96–112)
CO2: 23 meq/L (ref 19–32)
Creatinine, Ser: 0.47 mg/dL — ABNORMAL LOW (ref 0.50–1.10)
GFR calc Af Amer: >90 mL/min (ref >90)
GFR calc non Af Amer: >90 mL/min (ref >90)
Glucose, Bld: 78 mg/dL (ref 70–99)
POTASSIUM: 3.5 meq/L — AB (ref 3.7–5.3)
SODIUM: 138 meq/L (ref 137–147)

## 2013-09-03 LAB — GLUCOSE, CAPILLARY: GLUCOSE-CAPILLARY: 85 mg/dL (ref 70–99)

## 2013-09-03 MED ORDER — SODIUM CHLORIDE 0.9 % IJ SOLN: 3.0000 mL | INTRAMUSCULAR | Status: DC | PRN

## 2013-09-03 MED ORDER — SODIUM CHLORIDE 0.9 % IV BOLUS (SEPSIS)
1000.0000 mL | Freq: Once | INTRAVENOUS | Status: AC
  Administered 2013-09-03: 1000 mL via INTRAVENOUS

## 2013-09-03 MED ORDER — SODIUM CHLORIDE 0.9 % IV BOLUS (SEPSIS)
500.0000 mL | Freq: Once | INTRAVENOUS | Status: AC
  Administered 2013-09-03: 500 mL via INTRAVENOUS

## 2013-09-03 MED ORDER — SODIUM CHLORIDE 0.9 % IJ SOLN: 3.0000 mL | Freq: Two times a day (BID) | INTRAMUSCULAR | Status: DC

## 2013-09-03 MED ORDER — SODIUM CHLORIDE 0.9 % IV SOLN: 250.0000 mL | INTRAVENOUS | Status: DC | PRN

## 2013-09-03 NOTE — Discharge Instructions (Signed)
Return for any shortness of breath, chest pain, persistent lightheaded sensation of if you pass out again.  If you were given medicines take as directed.  If you are on coumadin or contraceptives realize their levels and effectiveness is altered by many different medicines.  If you have any reaction (rash, tongues swelling, other) to the medicines stop taking and see a physician.   Please follow up as directed and return to the ER or see a physician for new or worsening symptoms.  Thank you. Filed Vitals:   09/03/13 0430 09/03/13 0445 09/03/13 0553 09/03/13 0615  BP: 91/47 94/53 94/40  91/38  Pulse: 79 77 73 69  Temp:      TempSrc:      Resp:   16   Height:      Weight:      SpO2: 97% 98% 96% 100%   Keep your scheduled appointment with your obstetrician next week

## 2013-09-03 NOTE — ED Provider Notes (Signed)
CSN: [id number]     Arrival date & time 09/03/13  0011 History   First MD Initiated Contact with Patient 09/03/13 0320     Chief Complaint  Patient presents with  . Fall     (Consider location/radiation/quality/duration/timing/severity/associated sxs/prior Treatment) HPI Comments: 23 year old female with asthma, UTI, currently [redacted] weeks gestation [facility] of head injury and syncope. 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 No history of similar, no family history of sudden death, no known cardiac or blood clot history of patient. No Swelling, patient denies chest pain, shortness of breath, headache prior to or after event except for mild for head pain from the fall and head injury. Patient initially was confused with possibly unequal pupils per report. [facility]. Aside from mild anterior head pain patient feels back to normal, family agrees she is improved significantly. No seizure activity witnessed. Patient is not on blood thinners.  Patient is a 23 y.o. female presenting with fall. The history is provided by the patient and a relative.  Fall Pertinent negatives include no chest pain, no abdominal pain and no shortness of breath.    Past Medical History  Diagnosis Date  . Stomach ulcer   . Migraines   . Asthma    Past Surgical History  Procedure Laterality Date  . Eye muscle surgery    . External ear surgery     Family History  Problem Relation Age of Onset  . Cancer Mother   . Asthma Sister    History  Substance Use Topics  . Smoking status: Former Smoker    Types: Cigarettes    Quit date: 04/26/2013  . Smokeless tobacco: Never Used  . Alcohol Use: No   OB History   Grav Para Term Preterm Abortions TAB SAB Ect Mult Living   1              Review of Systems  Constitutional: Positive for fatigue. Negative for fever and chills.   HENT: Negative for congestion.   Eyes: Negative for visual disturbance.  Respiratory: Negative for shortness of breath.   Cardiovascular: Negative for chest pain.  Gastrointestinal: Negative for vomiting and abdominal pain.  Genitourinary: Negative for dysuria, flank pain, vaginal bleeding and vaginal discharge.  Musculoskeletal: Negative for back pain, neck pain and neck stiffness.  Skin: Negative for rash.  Neurological: Negative for seizures, syncope and light-headedness.      Allergies  Review of patient's allergies indicates no known allergies.  Home Medications   Current Outpatient Rx  Name  Route  Sig  Dispense  Refill  . butalbital-acetaminophen-caffeine (FIORICET) 50-325-40 MG per tablet   Oral   Take 2 tablets by mouth every 6 (six) hours as needed for headache.   30 tablet   0   . Prenatal Vit-Fe Fumarate-FA (PRENATAL MULTIVITAMIN) TABS tablet   Oral   Take 1 tablet by mouth daily at 12 noon.          BP 112/48  Pulse 69  Temp(Src) 98.5 F (36.9 C) (Oral)  Resp 16  Ht 5\' 3"  (1.6 m)  Wt 125 lb (56.7 kg)  BMI 22.15 kg/m2  SpO2 96%  LMP 02/25/2013 Physical Exam  Nursing note and vitals reviewed. Constitutional: She is oriented to person, place, and time. She appears well-developed and well-nourished.  HENT:  Head: Normocephalic and atraumatic.  Eyes: Conjunctivae are normal.  Right eye exhibits no discharge. Left eye exhibits no discharge.  Neck: Normal range of motion. Neck supple. No tracheal deviation present.  Cardiovascular: Normal rate, regular rhythm and intact distal pulses.   No murmur heard. Pulmonary/Chest: Effort normal and breath sounds normal.  Abdominal: Soft. She exhibits no distension. There is no tenderness. There is no guarding.  Musculoskeletal: She exhibits no edema.  No leg/ thigh or calf pain or swelling  Neurological: She is alert and oriented to person, place, and time. GCS eye subscore is 4. GCS verbal subscore is 5. GCS motor  subscore is 6.  5+ strength in UE and LE with f/e at major joints. Sensation to palpation intact in UE and LE. CNs 2-12 grossly intact.  EOMFI.  PERRL.   Finger nose and coordination intact bilateral.   Visual fields intact to finger testing.   Skin: Skin is warm. No rash noted.  Psychiatric: She has a normal mood and affect.    ED Course  Procedures (including critical care time).   EMERGENCY DEPARTMENT [place] CARDIAC EXAM "Study: Limited Ultrasound of the heart and pericardium"  INDICATIONS: syncope, pregnant, looking for right heart strain Multiple views of the heart and pericardium were obtained in real-time with a multi-frequency probe.  PERFORMED RK:YHCWCB  IMAGES ARCHIVED?: Yes  FINDINGS: No pericardial effusion, Normal contractility and Tamponade physiology absent  VIEWS USED: Parasternal long axis, Parasternal short axis and Apical 4 chamber   INTERPRETATION: Cardiac activity present, Pericardial effusioin absent, Cardiac tamponade absent and Normal contractility  No signs of right heart strain.  Emergency Ultrasound Study:  Limited Duplex of right lower extremity veins  Performed by Dr. Jodi Mourning Indication: leg pain and/or swelling Visualization of saphenous-femoral junction, proximal femoral vein and popliteal vein regions in transverse plane with full compression visualized.  Interpretation no dvt visualized, full compression Images archived electronically.  EMERGENCY DEPARTMENT [place] PREGNANCY "Study: Limited Ultrasound of the Pelvis for Pregnancy"  INDICATIONS:Pregnancy(required) Multiple views of the uterus and pelvic cavity were obtained in real-time with a multi-frequency probe.  APPROACH:Transabdominal   PERFORMED BY: Myself  IMAGES ARCHIVED?: Yes  PREGNANCY FREE FLUID: None  PREGNANCY FINDINGS: Intrauterine gestational sac noted, Fetal pole present and Fetal heart activity seen  INTERPRETATION: Viable intrauterine pregnancy   FETAL HEART RATE:  140s       Labs Review Labs Reviewed  CBC WITH DIFFERENTIAL - Abnormal; Notable for the following:    RBC 3.77 (*)    HCT 34.3 (*)    All other components within normal limits  BASIC METABOLIC PANEL - Abnormal; Notable for the following:    Potassium 3.5 (*)    Creatinine, Ser 0.47 (*)    All other components within normal limits  GLUCOSE, CAPILLARY   Imaging Review Ct Head Wo Contrast  09/03/2013   CLINICAL DATA:  Fall.  Headache and dizziness.  EXAM: CT HEAD WITHOUT CONTRAST  TECHNIQUE: Contiguous axial images were obtained from the base of the skull through the vertex without intravenous contrast.  COMPARISON:  None.  FINDINGS: Skull and Sinuses:Negative for fracture. Adenoid tonsil enlargement.  Orbits: No acute abnormality.  Brain: No evidence of acute abnormality, such as acute infarction, hemorrhage, hydrocephalus, or mass lesion/mass effect.  IMPRESSION: Negative head CT.   Electronically Signed   By: Tiburcio Pea M.D.   On: 09/03/2013 05:28     EKG Interpretation   Date/Time:  Sunday September 03 2013 04:51:20 EDT Ventricular Rate:  75 PR Interval:  147 QRS Duration: 84 QT Interval:  382  QTC Calculation: 427 R Axis:   76 Text Interpretation:  Sinus rhythm Borderline T wave abnormalities  Confirmed by Zackery Brine  MD, Michol Emory ([id number]) on 09/03/2013 5:01:15 AM      MDM   Final diagnoses:  Syncope  Concussion   Clinically patient with syncope and concussion from head injury. Patient currently pregnant bedside ultrasound was done and showed normal fetal heart rate, patient denies any significant abdominal pain or vaginal bleeding. Patient's only risk factor is her pregnancy, I highly doubt pulmonary embolism or acute cardiac event as patient has no symptoms currently has no risk factors except for pregnancy, has no signs of DVT on exam and focused but that ultrasound and no right heart strain visualized on bedside cardiac ultrasound. EKG no acute findings nonspecific T wave  changes in V3, reviewed.EKG similar to previous.  With confusion and possibly unequal pupils prior to arrival CT head performed. Fluids given IV. Formal US of LE bilateral to decrease pretest probability even further of blood clot causing syncope especially as d dimer likely pos in pregnancy and risk of radiation.  Likely plan for very close outpatient followup.  Mild trend in lowering blood pressure and ED however the lowest remains in the 90s. Patient is sleeping at the time has not had by mouth fluid intake all morning. IV fluid bolus and oral fluids given. On recheck patient asymptomatic, well-appearing, denies chest pain or shortness rest. Plan for duplex of both lower extremities to decrease pretest probability even further for pulmonary embolism as cause of her syncope. Close followup and strict reasons to return given to patient. Signed out with plan to followup duplex results and recheck prior to discharge.  Results and differential diagnosis were discussed with the patient.  Filed Vitals:   09/03/13 0430 09/03/13 0445 09/03/13 0553 09/03/13 0615  BP: 91/47 94/53 94/40  91/38  Pulse: 79 77 73 69  Temp:      TempSrc:      Resp:   16   Height:      Weight:      SpO2: 97% 98% 96% 100%        Enid Skeens, MD 09/03/13 [phone]

## 2013-09-03 NOTE — Progress Notes (Signed)
VASCULAR LAB PRELIMINARY  PRELIMINARY  PRELIMINARY  PRELIMINARY  Bilateral lower extremity venous Dopplers completed.    Preliminary report:  There is no DVT or SVT noted in the bilateral lower extremities.  Annali Lybrand, RVT 09/03/2013, 8:11 AM

## 2013-09-03 NOTE — ED Notes (Signed)
Dr. Jodi MourningZavitz updated on pt blood pressure.  Dr. Jodi MourningZavitz at bedside updating pt on status and further test.

## 2013-09-03 NOTE — ED Notes (Signed)
Dr. Zavitz at bedside with ultrasound.   

## 2013-09-03 NOTE — ED Provider Notes (Signed)
8:20 AM patient sleeping easily arousable asymptomatic no distress. Bilateral lower extremity Doppler ultrasound negative for DVT .Feels well to go home. Plan keep scheduled appointment with OB/GYN next week. Results for orders placed during the hospital encounter of 09/03/13  GLUCOSE, CAPILLARY      Result Value Ref Range   Glucose-Capillary 85  70 - 99 mg/dL  CBC WITH DIFFERENTIAL      Result Value Ref Range   WBC 8.4  4.0 - 10.5 K/uL   RBC 3.77 (*) 3.87 - 5.11 MIL/uL   Hemoglobin 12.1  12.0 - 15.0 g/dL   HCT 16.134.3 (*) 09.636.0 - 04.546.0 %   MCV 91.0  78.0 - 100.0 fL   MCH 32.1  26.0 - 34.0 pg   MCHC 35.3  30.0 - 36.0 g/dL   RDW 40.913.2  81.111.5 - 91.415.5 %   Platelets 176  150 - 400 K/uL   Neutrophils Relative % 68  43 - 77 %   Neutro Abs 5.7  1.7 - 7.7 K/uL   Lymphocytes Relative 25  12 - 46 %   Lymphs Abs 2.1  0.7 - 4.0 K/uL   Monocytes Relative 6  3 - 12 %   Monocytes Absolute 0.5  0.1 - 1.0 K/uL   Eosinophils Relative 1  0 - 5 %   Eosinophils Absolute 0.1  0.0 - 0.7 K/uL   Basophils Relative 0  0 - 1 %   Basophils Absolute 0.0  0.0 - 0.1 K/uL  BASIC METABOLIC PANEL      Result Value Ref Range   Sodium 138  137 - 147 mEq/L   Potassium 3.5 (*) 3.7 - 5.3 mEq/L   Chloride 102  96 - 112 mEq/L   CO2 23  19 - 32 mEq/L   Glucose, Bld 78  70 - 99 mg/dL   BUN 7  6 - 23 mg/dL   Creatinine, Ser 7.820.47 (*) 0.50 - 1.10 mg/dL   Calcium 8.8  8.4 - 95.610.5 mg/dL   GFR calc non Af Amer >90  >90 mL/min   GFR calc Af Amer >90  >90 mL/min   Ct Head Wo Contrast  09/03/2013   CLINICAL DATA:  Fall.  Headache and dizziness.  EXAM: CT HEAD WITHOUT CONTRAST  TECHNIQUE: Contiguous axial images were obtained from the base of the skull through the vertex without intravenous contrast.  COMPARISON:  None.  FINDINGS: Skull and Sinuses:Negative for fracture. Adenoid tonsil enlargement.  Orbits: No acute abnormality.  Brain: No evidence of acute abnormality, such as acute infarction, hemorrhage, hydrocephalus, or mass  lesion/mass effect.  IMPRESSION: Negative head CT.   Electronically Signed   By: Tiburcio PeaJonathan  Watts M.D.   On: 09/03/2013 05:28     Doug SouSam Hao Dion, MD 09/03/13 [phone]

## 2013-09-03 NOTE — MAU Note (Signed)
Patient arrives with her mother after falling at work. Patient states she remember taking some food to a table and then things went black and she was told she fell. Per the patient mother the patient fell forward due to her knees being skinned however she is unsure of all the details as she was not there to witness the fall. EMS assessed the patient at the scene and said she was disoriented and blood was low (72 per patient). Patient is currently complaining of a headache, dizziness and knee pain.

## 2013-09-03 NOTE — MAU Provider Note (Signed)
None     Chief Complaint:  Melanie Galloway is a 23 y.o. G1P0 at 2069w2d by R=12 presents by EMS after a fall at work. Pt works at [employer]. Pt was carrying trey when everything went black. Reports being told she was very pale when she awoke and had a nose bleed. Pt remembers waking up sitting in a chair and EMS was there. Pt reports that a "low blood sugar" at 74. Since then patient has been disoriented per family. Pt endorses headache and sore left knee. Mother of patient able to give collateral history.  Pt is usually sharp and quick witted. Currently communicating like a middle school child.   Pt states that she feels the fetus moving, "doesn't know" if she is having contractions, no LOF, no VB  Obstetrical/Gynecological History: OB History   Grav Para Term Preterm Abortions TAB SAB Ect Mult Living   1              Past Medical History: Past Medical History  Diagnosis Date  . Stomach ulcer   . Migraines   . Asthma     Past Surgical History: Past Surgical History  Procedure Laterality Date  . Eye muscle surgery    . External ear surgery      Family History: Family History  Problem Relation Age of Onset  . Cancer Mother   . Asthma Sister     Social History: History  Substance Use Topics  . Smoking status: Former Smoker    Types: Cigarettes    Quit date: 04/26/2013  . Smokeless tobacco: Never Used  . Alcohol Use: No    Allergies: No Known Allergies  Meds:  Prescriptions prior to admission  Medication Sig Dispense Refill  . butalbital-acetaminophen-caffeine (FIORICET) 50-325-40 MG per tablet Take 2 tablets by mouth every 6 (six) hours as needed for headache.  30 tablet  0  . Prenatal Vit-Fe Fumarate-FA (PRENATAL MULTIVITAMIN) TABS tablet Take 1 tablet by mouth daily at 12 noon.        Review of Systems -   Review of Systems  Endorses headache, frequent nose bleeds since being pregnant. Frequent headaches. Minimal blurry vision with normal acuity,    Physical Exam  Blood pressure 108/66, pulse 89, temperature 98.4 F (36.9 C), last menstrual period 02/25/2013, SpO2 100.00%. GENERAL: Well-developed, well-nourished female in no acute distress.  Head: Tender right frontal sinus, no LAD. EOMI, PRLA, however pupils are unequal and having difficulty focusing.  Neg battle sign, neg cone of light bilaterally. No fluid behind TM. Neuro: A&Ox2 - unsure of year. But knows name, location and month  CN2-12 intact,.but unequal pupils  LUNGS: Clear to auscultation bilaterally.  HEART: Regular rate and rhythm. ABDOMEN: Soft, nontender, nondistended, gravid.  EXTREMITIES: Left knee tender to palpation at patella, but FROM, no erythema, edema   FHT:  Baseline rate 120s bpm   Variability moderate  Accelerations present   Decelerations none Contractions: 1 over 30min and not felt by patient   Labs: Results for orders placed during the hospital encounter of 09/03/13 (from the past 24 hour(s))  GLUCOSE, CAPILLARY   Collection Time    09/03/13  1:06 AM      Result Value Ref Range   Glucose-Capillary 85  70 - 99 mg/dL   Imaging Studies:  No results found.  Assessment: Melanie Galloway is  23 y.o. G1P0 at 8769w2d presents with Altered mental status after an unwittnessed syncope. DDx: vasovagal syncope complicated with concussion, CVA, seizure  with a prolonged ictal state. Cannot exclude infection but stable vitals. Less likely but not evaluated prior to discharge include metabolic, pulm, electrolye, uremia. Psychiatric.   Plan: Transfer to [facility] Emergency room for evaluation and likely neurology consult. Discussed with Dr. Marcie Mowers at approx 0115  Fetus appears to be reactive and reassuring. No evidence of labor based on contraction pattern. Benefits outweigh risks to be at [facility] ED.   Lakeyn Dokken RYAN 3/29/20151:15 AM

## 2013-09-03 NOTE — ED Notes (Signed)
Pt reports having pain in her forehead and on the bridge of her nose 10/10 on pain.  Pt states she was at work when she had a syncopal episode carrying food to a customers table.  Pt states she was wearing cowboy boots and the floor is a hardwood surface.  Pt is vague as to how long she was out for and if she slipped causing the syncopal episode.

## 2013-09-04 NOTE — MAU Provider Note (Signed)
Attestation of Attending Supervision of Obstetric Fellow: Evaluation and management procedures were performed by the Obstetric Fellow under my supervision and collaboration.  I have reviewed the Obstetric Fellow's note and chart, and I agree with the management and plan.  Derica Leiber, MD, FACOG Attending Obstetrician & Gynecologist Faculty Practice, [facility]   

## 2013-09-05 ENCOUNTER — Encounter: Payer: Self-pay | Admitting: Nurse Practitioner

## 2013-09-05 ENCOUNTER — Ambulatory Visit (INDEPENDENT_AMBULATORY_CARE_PROVIDER_SITE_OTHER): Payer: BC Managed Care – PPO | Admitting: Nurse Practitioner

## 2013-09-05 VITALS — BP 104/80 | HR 79 | Ht 63.0 in | Wt 124.8 lb

## 2013-09-05 DIAGNOSIS — G43709 Chronic migraine without aura, not intractable, without status migrainosus: Secondary | ICD-10-CM

## 2013-09-05 DIAGNOSIS — IMO0002 Reserved for concepts with insufficient information to code with codable children: Secondary | ICD-10-CM | POA: Insufficient documentation

## 2013-09-05 DIAGNOSIS — F32A Depression, unspecified: Secondary | ICD-10-CM

## 2013-09-05 DIAGNOSIS — F419 Anxiety disorder, unspecified: Secondary | ICD-10-CM | POA: Insufficient documentation

## 2013-09-05 DIAGNOSIS — F329 Major depressive disorder, single episode, unspecified: Secondary | ICD-10-CM

## 2013-09-05 DIAGNOSIS — F3289 Other specified depressive episodes: Secondary | ICD-10-CM

## 2013-09-05 DIAGNOSIS — F411 Generalized anxiety disorder: Secondary | ICD-10-CM

## 2013-09-05 MED ORDER — SUMATRIPTAN SUCCINATE 100 MG PO TABS: 100.0000 mg | ORAL_TABLET | Freq: Once | ORAL | Status: DC | PRN

## 2013-09-05 MED ORDER — ALBUTEROL SULFATE HFA 108 (90 BASE) MCG/ACT IN AERS: 1.0000 | INHALATION_SPRAY | Freq: Four times a day (QID) | RESPIRATORY_TRACT | Status: DC | PRN

## 2013-09-05 MED ORDER — VENLAFAXINE HCL ER 37.5 MG PO CP24: 37.5000 mg | ORAL_CAPSULE | Freq: Every day | ORAL | Status: DC

## 2013-09-05 MED ORDER — PROMETHAZINE HCL 25 MG PO TABS: 25.0000 mg | ORAL_TABLET | Freq: Four times a day (QID) | ORAL | Status: DC | PRN

## 2013-09-05 NOTE — Patient Instructions (Signed)
Migraine Headache A migraine headache is an intense, throbbing pain on one or both sides of your head. A migraine can last for 30 minutes to several hours. CAUSES  The exact cause of a migraine headache is not always known. However, a migraine may be caused when nerves in the brain become irritated and release chemicals that cause inflammation. This causes pain. Certain things may also trigger migraines, such as:  Alcohol.  Smoking.  Stress.  Menstruation.  Aged cheeses.  Foods or drinks that contain nitrates, glutamate, aspartame, or tyramine.  Lack of sleep.  Chocolate.  Caffeine.  Hunger.  Physical exertion.  Fatigue.  Medicines used to treat chest pain (nitroglycerine), birth control pills, estrogen, and some blood pressure medicines. SIGNS AND SYMPTOMS  Pain on one or both sides of your head.  Pulsating or throbbing pain.  Severe pain that prevents daily activities.  Pain that is aggravated by any physical activity.  Nausea, vomiting, or both.  Dizziness.  Pain with exposure to bright lights, loud noises, or activity.  General sensitivity to bright lights, loud noises, or smells. Before you get a migraine, you may get warning signs that a migraine is coming (aura). An aura may include:  Seeing flashing lights.  Seeing bright spots, halos, or zig-zag lines.  Having tunnel vision or blurred vision.  Having feelings of numbness or tingling.  Having trouble talking.  Having muscle weakness. DIAGNOSIS  A migraine headache is often diagnosed based on:  Symptoms.  Physical exam.  A CT scan or MRI of your head. These imaging tests cannot diagnose migraines, but they can help rule out other causes of headaches. TREATMENT Medicines may be given for pain and nausea. Medicines can also be given to help prevent recurrent migraines.  HOME CARE INSTRUCTIONS  Only take over-the-counter or prescription medicines for pain or discomfort as directed by your  health care provider. The use of long-term narcotics is not recommended.  Lie down in a dark, quiet room when you have a migraine.  Keep a journal to find out what may trigger your migraine headaches. For example, write down:  What you eat and drink.  How much sleep you get.  Any change to your diet or medicines.  Limit alcohol consumption.  Quit smoking if you smoke.  Get 7 9 hours of sleep, or as recommended by your health care provider.  Limit stress.  Keep lights dim if bright lights bother you and make your migraines worse. SEEK IMMEDIATE MEDICAL CARE IF:   Your migraine becomes severe.  You have a fever.  You have a stiff neck.  You have vision loss.  You have muscular weakness or loss of muscle control.  You start losing your balance or have trouble walking.  You feel faint or pass out.  You have severe symptoms that are different from your first symptoms. MAKE SURE YOU:   Understand these instructions.  Will watch your condition.  Will get help right away if you are not doing well or get worse. Document Released: 05/25/2005 Document Revised: 03/15/2013 Document Reviewed: 01/30/2013 ExitCare Patient Information 2014 [employer].  

## 2013-09-05 NOTE — Progress Notes (Signed)
Diagnosis: Chronic migraine, anxiety, depression  History: Melanie Galloway 23 y.o. G1P0 presents to [facility] office for migraine consultation. She has had migraines for the last 2 years. Her Aunt has migraine. She is currently 25 weeks and 4 days pregnant with unplanned pregnancy. The FOB is involved and supportive and they planned to be married when he joins the [employer]. She has been having daily migraines for the last one year. She can not think of any issue that might have caused this. She was on Adderall for ADD and was taken off and felt her migraines got somewhat worse. She admits to depression and anxiety not related to any event. She was at work last week and had a syncopal episode after not eating for 7 hours and fell down and maybe hit her head. She was transferred to [facility], CT of head was negative and dopplers were negative for DVT. Her personal habits are not good, she sleep up to 12 hours at a time, eats poorly, and works 2 twelve hour days per week from 3pm to 3 am. She is currently living at home with her mother who is single. She had asthma without having an inhaler. She quit smoking cigarettes and marijuana. She is taking up to 4 Fioricet's per day.  Location: Left occipital can go to right  Number of Headache days/month: daily for one year Severe: 10 Moderate: 20 Mild:0  Current Outpatient Prescriptions on File Prior to Visit  Medication Sig Dispense Refill  . butalbital-acetaminophen-caffeine (FIORICET) 50-325-40 MG per tablet Take 2 tablets by mouth every 6 (six) hours as needed for headache.  30 tablet  0  . Prenatal Vit-Fe Fumarate-FA (PRENATAL MULTIVITAMIN) TABS tablet Take 1 tablet by mouth daily at 12 noon.       No current facility-administered medications on file prior to visit.    Acute/ prevention: Fioricet, NSAIDS, Zofran  Past Medical History  Diagnosis Date  . Stomach ulcer   . Migraines   . Asthma    Past Surgical History  Procedure Laterality Date   . Eye muscle surgery    . External ear surgery     Family History  Problem Relation Age of Onset  . Cancer Mother   . Asthma Sister    Social History:  reports that she quit smoking about 4 months ago. Her smoking use included Cigarettes. She smoked 0.00 packs per day. She has never used smokeless tobacco. She reports that she does not drink alcohol or use illicit drugs. Allergies: No Known Allergies  Triggers: Not eating, sleeping too much, stress  Birth control: pregnant [redacted] weeks and 4 days. Boy named Ryder  ROS: positive for migraine without aura, asthma, allergies, anxiety, depression, ADD  Exam: Well developed, well nourished caucasian feamle  General:NAD HEENT:Negative Cardiac:RRR Lungs:Clear Neuro:Negative Skin:Warm and dry  Procedure: 2cc lidocaine, 2 cc marcaine, 1 cc dexamethazone. Injected with 1cc each site in left Occipital, left trap. Pt tolerated procedure well.     Impression: Chronic migraine without aura Anxiety Depression Pregnancy Asthma  Plan: Discussed the pathophysiology of migraine and risk benefits of medications in pregnancy. She is willing to start Effexor in low dose for prevention. We did trigger point injections to help her break the headache cycle. We discussed life style changes including not sleeping 12 hours, eating every 4 hours, not caring such a heavy bag and starting pregnancy yoga. She will be given Imitrex and phenergan to take when she gets a migraine. She is advised to limit  Fioricet intake. She will be given a Proventil  inhaler as she does not have one now.  She will return in 2 weeks   Time Spent: one hour

## 2013-09-12 ENCOUNTER — Ambulatory Visit (INDEPENDENT_AMBULATORY_CARE_PROVIDER_SITE_OTHER): Payer: BC Managed Care – PPO | Admitting: Obstetrics and Gynecology

## 2013-09-12 VITALS — BP 115/76 | Temp 97.2°F | Wt 124.1 lb

## 2013-09-12 DIAGNOSIS — O99891 Other specified diseases and conditions complicating pregnancy: Secondary | ICD-10-CM

## 2013-09-12 DIAGNOSIS — J45909 Unspecified asthma, uncomplicated: Secondary | ICD-10-CM

## 2013-09-12 DIAGNOSIS — O99519 Diseases of the respiratory system complicating pregnancy, unspecified trimester: Principal | ICD-10-CM

## 2013-09-12 DIAGNOSIS — Z23 Encounter for immunization: Secondary | ICD-10-CM

## 2013-09-12 DIAGNOSIS — Z3400 Encounter for supervision of normal first pregnancy, unspecified trimester: Secondary | ICD-10-CM

## 2013-09-12 DIAGNOSIS — O9989 Other specified diseases and conditions complicating pregnancy, childbirth and the puerperium: Secondary | ICD-10-CM

## 2013-09-12 LAB — CBC
HCT: 35.6 % — ABNORMAL LOW (ref 36.0–46.0)
Hemoglobin: 12.2 g/dL (ref 12.0–15.0)
MCH: 30.9 pg (ref 26.0–34.0)
MCHC: 34.3 g/dL (ref 30.0–36.0)
MCV: 90.1 fL (ref 78.0–100.0)
Platelets: 200 10*3/uL (ref 150–400)
RBC: 3.95 MIL/uL (ref 3.87–5.11)
RDW: 13.8 % (ref 11.5–15.5)
WBC: 9.9 10*3/uL (ref 4.0–10.5)

## 2013-09-12 LAB — POCT URINALYSIS DIP (DEVICE)
Bilirubin Urine: NEGATIVE
GLUCOSE, UA: 100 mg/dL — AB
Hgb urine dipstick: NEGATIVE
Ketones, ur: NEGATIVE mg/dL
Leukocytes, UA: NEGATIVE
Nitrite: NEGATIVE
Protein, ur: NEGATIVE mg/dL
SPECIFIC GRAVITY, URINE: 1.01 (ref 1.005–1.030)
UROBILINOGEN UA: 0.2 mg/dL (ref 0.0–1.0)
pH: 5.5 (ref 5.0–8.0)

## 2013-09-12 MED ORDER — TETANUS-DIPHTH-ACELL PERTUSSIS 5-2.5-18.5 LF-MCG/0.5 IM SUSP: 0.5000 mL | Freq: Once | INTRAMUSCULAR | Status: DC

## 2013-09-12 NOTE — Progress Notes (Signed)
Pulse- 81  Edema-feet Went MAU due fainting at work cause she had concusion she was [facility]

## 2013-09-12 NOTE — Progress Notes (Signed)
28 wk labs done. Seen MAU and [facility] for concussion after fainting. Doing well now. No migraines. Still doing some 12 hour shifts, but gets break. Urged to attend classes. FOB in [employer], plans to be here for delivery.

## 2013-09-12 NOTE — Patient Instructions (Signed)
Second Trimester of Pregnancy The second trimester is from week 13 through week 28, months 4 through 6. The second trimester is often a time when you feel your best. Your body has also adjusted to being pregnant, and you begin to feel better physically. Usually, morning sickness has lessened or quit completely, you may have more energy, and you may have an increase in appetite. The second trimester is also a time when the fetus is growing rapidly. At the end of the sixth month, the fetus is about 9 inches long and weighs about 1 pounds. You will likely begin to feel the baby move (quickening) between 18 and 20 weeks of the pregnancy. BODY CHANGES Your body goes through many changes during pregnancy. The changes vary from woman to woman.   Your weight will continue to increase. You will notice your lower abdomen bulging out.  You may begin to get stretch marks on your hips, abdomen, and breasts.  You may develop headaches that can be relieved by medicines approved by your caregiver.  You may urinate more often because the fetus is pressing on your bladder.  You may develop or continue to have heartburn as a result of your pregnancy.  You may develop constipation because certain hormones are causing the muscles that push waste through your intestines to slow down.  You may develop hemorrhoids or swollen, bulging veins (varicose veins).  You may have back pain because of the weight gain and pregnancy hormones relaxing your joints between the bones in your pelvis and as a result of a shift in weight and the muscles that support your balance.  Your breasts will continue to grow and be tender.  Your gums may bleed and may be sensitive to brushing and flossing.  Dark spots or blotches (chloasma, mask of pregnancy) may develop on your face. This will likely fade after the baby is born.  A dark line from your belly button to the pubic area (linea nigra) may appear. This will likely fade after the  baby is born. WHAT TO EXPECT AT YOUR PRENATAL VISITS During a routine prenatal visit:  You will be weighed to make sure you and the fetus are growing normally.  Your blood pressure will be taken.  Your abdomen will be measured to track your baby's growth.  The fetal heartbeat will be listened to.  Any test results from the previous visit will be discussed. Your caregiver may ask you:  How you are feeling.  If you are feeling the baby move.  If you have had any abnormal symptoms, such as leaking fluid, bleeding, severe headaches, or abdominal cramping.  If you have any questions. Other tests that may be performed during your second trimester include:  Blood tests that check for:  Low iron levels (anemia).  Gestational diabetes (between 24 and 28 weeks).  Rh antibodies.  Urine tests to check for infections, diabetes, or protein in the urine.  An ultrasound to confirm the proper growth and development of the baby.  An amniocentesis to check for possible genetic problems.  Fetal screens for spina bifida and Down syndrome. HOME CARE INSTRUCTIONS   Avoid all smoking, herbs, alcohol, and unprescribed drugs. These chemicals affect the formation and growth of the baby.  Follow your caregiver's instructions regarding medicine use. There are medicines that are either safe or unsafe to take during pregnancy.  Exercise only as directed by your caregiver. Experiencing uterine cramps is a good sign to stop exercising.  Continue to eat regular,   healthy meals.  Wear a good support bra for breast tenderness.  Do not use hot tubs, steam rooms, or saunas.  Wear your seat belt at all times when driving.  Avoid raw meat, uncooked cheese, cat litter boxes, and soil used by cats. These carry germs that can cause birth defects in the baby.  Take your prenatal vitamins.  Try taking a stool softener (if your caregiver approves) if you develop constipation. Eat more high-fiber foods,  such as fresh vegetables or fruit and whole grains. Drink plenty of fluids to keep your urine clear or pale yellow.  Take warm sitz baths to soothe any pain or discomfort caused by hemorrhoids. Use hemorrhoid cream if your caregiver approves.  If you develop varicose veins, wear support hose. Elevate your feet for 15 minutes, 3 4 times a day. Limit salt in your diet.  Avoid heavy lifting, wear low heel shoes, and practice good posture.  Rest with your legs elevated if you have leg cramps or low back pain.  Visit your dentist if you have not gone yet during your pregnancy. Use a soft toothbrush to brush your teeth and be gentle when you floss.  A sexual relationship may be continued unless your caregiver directs you otherwise.  Continue to go to all your prenatal visits as directed by your caregiver. SEEK MEDICAL CARE IF:   You have dizziness.  You have mild pelvic cramps, pelvic pressure, or nagging pain in the abdominal area.  You have persistent nausea, vomiting, or diarrhea.  You have a bad smelling vaginal discharge.  You have pain with urination. SEEK IMMEDIATE MEDICAL CARE IF:   You have a fever.  You are leaking fluid from your vagina.  You have spotting or bleeding from your vagina.  You have severe abdominal cramping or pain.  You have rapid weight gain or loss.  You have shortness of breath with chest pain.  You notice sudden or extreme swelling of your face, hands, ankles, feet, or legs.  You have not felt your baby move in over an hour.  You have severe headaches that do not go away with medicine.  You have vision changes. Document Released: 05/19/2001 Document Revised: 01/25/2013 Document Reviewed: 07/26/2012 ExitCare Patient Information 2014 [employer].  

## 2013-09-13 LAB — GLUCOSE TOLERANCE, 1 HOUR (50G) W/O FASTING: GLUCOSE 1 HOUR GTT: 100 mg/dL (ref 70–140)

## 2013-09-13 LAB — HIV ANTIBODY (ROUTINE TESTING W REFLEX): HIV: NONREACTIVE

## 2013-09-13 LAB — RPR

## 2013-09-26 ENCOUNTER — Encounter: Payer: Self-pay | Admitting: Family Medicine

## 2013-09-26 ENCOUNTER — Ambulatory Visit (INDEPENDENT_AMBULATORY_CARE_PROVIDER_SITE_OTHER): Payer: BC Managed Care – PPO | Admitting: Family Medicine

## 2013-09-26 VITALS — BP 108/62 | HR 74 | Temp 97.6°F | Wt 126.6 lb

## 2013-09-26 DIAGNOSIS — O99891 Other specified diseases and conditions complicating pregnancy: Secondary | ICD-10-CM

## 2013-09-26 DIAGNOSIS — O99519 Diseases of the respiratory system complicating pregnancy, unspecified trimester: Principal | ICD-10-CM

## 2013-09-26 DIAGNOSIS — J45909 Unspecified asthma, uncomplicated: Secondary | ICD-10-CM

## 2013-09-26 DIAGNOSIS — Z3400 Encounter for supervision of normal first pregnancy, unspecified trimester: Secondary | ICD-10-CM

## 2013-09-26 DIAGNOSIS — O9989 Other specified diseases and conditions complicating pregnancy, childbirth and the puerperium: Secondary | ICD-10-CM

## 2013-09-26 LAB — POCT URINALYSIS DIP (DEVICE)
Bilirubin Urine: NEGATIVE
Glucose, UA: 250 mg/dL — AB
HGB URINE DIPSTICK: NEGATIVE
Ketones, ur: NEGATIVE mg/dL
LEUKOCYTES UA: NEGATIVE
Nitrite: NEGATIVE
Protein, ur: NEGATIVE mg/dL
Specific Gravity, Urine: 1.02 (ref 1.005–1.030)
Urobilinogen, UA: 0.2 mg/dL (ref 0.0–1.0)
pH: 5.5 (ref 5.0–8.0)

## 2013-09-26 NOTE — Progress Notes (Signed)
+  FM, no lof, no vb, no ctx  No complaints Melanie Galloway is a 23 y.o. G1P0 at 9662w4d  here for ROB visit.  Discussed with Patient:  -Plans to breast feed.  All questions answered. -Continue prenatal vitamins. - Reviewed genetics screen (Quad screen / first trimester screen / serum integrated screen / full integrated screen done/ not done).   -Reviewed fetal kick counts (Pt to perform daily at a time when the baby is active, lie laterally with both hands on belly in quiet room and count all movements (hiccups, shoulder rolls, obvious kicks, etc); pt is to report to clinic or MAU for less than 10 movements felt in a one hour time period-pt told as soon as she counts 10 movements the count is complete.)  - Routine precautions discussed (depression, infection s/s).   Patient provided with all pertinent phone numbers for emergencies. - RTC for any VB, regular, painful cramps/ctxs occurring at a rate of >2/10 min, fever (100.5 or higher), n/v/d, any pain that is unresolving or worsening, LOF, decreased fetal movement, CP, SOB, edema  Problems: Patient Active Problem List   Diagnosis Date Noted  . Chronic migraine 09/05/2013  . Anxiety 09/05/2013  . Depression 09/05/2013  . Chronic headache 07/04/2013  . Abnormal maternal serum screening test 06/13/2013  . UTI (urinary tract infection) in pregnancy in first trimester 06/06/2013  . Supervision of normal first pregnancy 05/09/2013  . Asthma complicating pregnancy, antepartum 05/09/2013    To Do:  [ ]  Vaccines: Flu:  Tdap: recd [ ]  BCM: mirena   Edu: [x ] PTL precautions; [ ]  BF class; [ ]  childbirth class; [ ]   BF counseling;

## 2013-10-10 ENCOUNTER — Encounter: Payer: Self-pay | Admitting: Obstetrics and Gynecology

## 2013-10-10 ENCOUNTER — Ambulatory Visit (INDEPENDENT_AMBULATORY_CARE_PROVIDER_SITE_OTHER): Payer: BC Managed Care – PPO | Admitting: Obstetrics and Gynecology

## 2013-10-10 VITALS — BP 110/63 | HR 88 | Temp 97.5°F | Wt 130.4 lb

## 2013-10-10 DIAGNOSIS — Z3400 Encounter for supervision of normal first pregnancy, unspecified trimester: Secondary | ICD-10-CM

## 2013-10-10 LAB — POCT URINALYSIS DIP (DEVICE)
BILIRUBIN URINE: NEGATIVE
Glucose, UA: 250 mg/dL — AB
Hgb urine dipstick: NEGATIVE
KETONES UR: NEGATIVE mg/dL
LEUKOCYTES UA: NEGATIVE
Nitrite: NEGATIVE
PH: 6.5 (ref 5.0–8.0)
Protein, ur: NEGATIVE mg/dL
Specific Gravity, Urine: 1.01 (ref 1.005–1.030)
Urobilinogen, UA: 0.2 mg/dL (ref 0.0–1.0)

## 2013-10-10 NOTE — Patient Instructions (Signed)
Third Trimester of Pregnancy  The third trimester is from week 29 through week 42, months 7 through 9. The third trimester is a time when the fetus is growing rapidly. At the end of the ninth month, the fetus is about 20 inches in length and weighs 6 10 pounds.   BODY CHANGES  Your body goes through many changes during pregnancy. The changes vary from woman to woman.    Your weight will continue to increase. You can expect to gain 25 35 pounds (11 16 kg) by the end of the pregnancy.   You may begin to get stretch marks on your hips, abdomen, and breasts.   You may urinate more often because the fetus is moving lower into your pelvis and pressing on your bladder.   You may develop or continue to have heartburn as a result of your pregnancy.   You may develop constipation because certain hormones are causing the muscles that push waste through your intestines to slow down.   You may develop hemorrhoids or swollen, bulging veins (varicose veins).   You may have pelvic pain because of the weight gain and pregnancy hormones relaxing your joints between the bones in your pelvis. Back aches may result from over exertion of the muscles supporting your posture.   Your breasts will continue to grow and be tender. A yellow discharge may leak from your breasts called colostrum.   Your belly button may stick out.   You may feel short of breath because of your expanding uterus.   You may notice the fetus "dropping," or moving lower in your abdomen.   You may have a bloody mucus discharge. This usually occurs a few days to a week before labor begins.   Your cervix becomes thin and soft (effaced) near your due date.  WHAT TO EXPECT AT YOUR PRENATAL EXAMS   You will have prenatal exams every 2 weeks until week 36. Then, you will have weekly prenatal exams. During a routine prenatal visit:   You will be weighed to make sure you and the fetus are growing normally.   Your blood pressure is taken.   Your abdomen will be  measured to track your baby's growth.   The fetal heartbeat will be listened to.   Any test results from the previous visit will be discussed.   You may have a cervical check near your due date to see if you have effaced.  At around 36 weeks, your caregiver will check your cervix. At the same time, your caregiver will also perform a test on the secretions of the vaginal tissue. This test is to determine if a type of bacteria, Group B streptococcus, is present. Your caregiver will explain this further.  Your caregiver may ask you:   What your birth plan is.   How you are feeling.   If you are feeling the baby move.   If you have had any abnormal symptoms, such as leaking fluid, bleeding, severe headaches, or abdominal cramping.   If you have any questions.  Other tests or screenings that may be performed during your third trimester include:   Blood tests that check for low iron levels (anemia).   Fetal testing to check the health, activity level, and growth of the fetus. Testing is done if you have certain medical conditions or if there are problems during the pregnancy.  FALSE LABOR  You may feel small, irregular contractions that eventually go away. These are called Braxton Hicks contractions, or   false labor. Contractions may last for hours, days, or even weeks before true labor sets in. If contractions come at regular intervals, intensify, or become painful, it is best to be seen by your caregiver.   SIGNS OF LABOR    Menstrual-like cramps.   Contractions that are 5 minutes apart or less.   Contractions that start on the top of the uterus and spread down to the lower abdomen and back.   A sense of increased pelvic pressure or back pain.   A watery or bloody mucus discharge that comes from the vagina.  If you have any of these signs before the 37th week of pregnancy, call your caregiver right away. You need to go to the hospital to get checked immediately.  HOME CARE INSTRUCTIONS    Avoid all  smoking, herbs, alcohol, and unprescribed drugs. These chemicals affect the formation and growth of the baby.   Follow your caregiver's instructions regarding medicine use. There are medicines that are either safe or unsafe to take during pregnancy.   Exercise only as directed by your caregiver. Experiencing uterine cramps is a good sign to stop exercising.   Continue to eat regular, healthy meals.   Wear a good support bra for breast tenderness.   Do not use hot tubs, steam rooms, or saunas.   Wear your seat belt at all times when driving.   Avoid raw meat, uncooked cheese, cat litter boxes, and soil used by cats. These carry germs that can cause birth defects in the baby.   Take your prenatal vitamins.   Try taking a stool softener (if your caregiver approves) if you develop constipation. Eat more high-fiber foods, such as fresh vegetables or fruit and whole grains. Drink plenty of fluids to keep your urine clear or pale yellow.   Take warm sitz baths to soothe any pain or discomfort caused by hemorrhoids. Use hemorrhoid cream if your caregiver approves.   If you develop varicose veins, wear support hose. Elevate your feet for 15 minutes, 3 4 times a day. Limit salt in your diet.   Avoid heavy lifting, wear low heal shoes, and practice good posture.   Rest a lot with your legs elevated if you have leg cramps or low back pain.   Visit your dentist if you have not gone during your pregnancy. Use a soft toothbrush to brush your teeth and be gentle when you floss.   A sexual relationship may be continued unless your caregiver directs you otherwise.   Do not travel far distances unless it is absolutely necessary and only with the approval of your caregiver.   Take prenatal classes to understand, practice, and ask questions about the labor and delivery.   Make a trial run to the hospital.   Pack your hospital bag.   Prepare the baby's nursery.   Continue to go to all your prenatal visits as directed  by your caregiver.  SEEK MEDICAL CARE IF:   You are unsure if you are in labor or if your water has broken.   You have dizziness.   You have mild pelvic cramps, pelvic pressure, or nagging pain in your abdominal area.   You have persistent nausea, vomiting, or diarrhea.   You have a bad smelling vaginal discharge.   You have pain with urination.  SEEK IMMEDIATE MEDICAL CARE IF:    You have a fever.   You are leaking fluid from your vagina.   You have spotting or bleeding from your vagina.     You have severe abdominal cramping or pain.   You have rapid weight loss or gain.   You have shortness of breath with chest pain.   You notice sudden or extreme swelling of your face, hands, ankles, feet, or legs.   You have not felt your baby move in over an hour.   You have severe headaches that do not go away with medicine.   You have vision changes.  Document Released: 05/19/2001 Document Revised: 01/25/2013 Document Reviewed: 07/26/2012  Exit[employer] Patient Information 2014 [employer].

## 2013-10-10 NOTE — Progress Notes (Signed)
Good FM. Encouraged classes. 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 FOB starting [employer], probably won't be home for delivery.

## 2013-10-17 ENCOUNTER — Encounter (HOSPITAL_COMMUNITY): Payer: Self-pay | Admitting: *Deleted

## 2013-10-17 ENCOUNTER — Inpatient Hospital Stay (HOSPITAL_COMMUNITY): Payer: BC Managed Care – PPO

## 2013-10-17 ENCOUNTER — Ambulatory Visit (HOSPITAL_COMMUNITY)
Admission: RE | Admit: 2013-10-17 | Discharge: 2013-10-17 | Disposition: A | Discharge status: Home / Self Care | Payer: BC Managed Care – PPO | Source: Ambulatory Visit | Attending: Family Medicine | Admitting: Family Medicine

## 2013-10-17 ENCOUNTER — Ambulatory Visit (HOSPITAL_COMMUNITY): Admission: RE | Admit: 2013-10-17 | Payer: BC Managed Care – PPO | Source: Ambulatory Visit

## 2013-10-17 ENCOUNTER — Inpatient Hospital Stay (HOSPITAL_COMMUNITY)
Admission: AD | Admit: 2013-10-17 | Discharge: 2013-10-18 | Disposition: A | Discharge status: Home / Self Care | Payer: BC Managed Care – PPO | Source: Ambulatory Visit | Attending: Obstetrics & Gynecology | Admitting: Obstetrics & Gynecology

## 2013-10-17 DIAGNOSIS — Z87891 Personal history of nicotine dependence: Secondary | ICD-10-CM | POA: Insufficient documentation

## 2013-10-17 DIAGNOSIS — O9A213 Injury, poisoning and certain other consequences of external causes complicating pregnancy, third trimester: Secondary | ICD-10-CM

## 2013-10-17 DIAGNOSIS — Y92009 Unspecified place in unspecified non-institutional (private) residence as the place of occurrence of the external cause: Secondary | ICD-10-CM | POA: Insufficient documentation

## 2013-10-17 DIAGNOSIS — W2203XA Walked into furniture, initial encounter: Secondary | ICD-10-CM | POA: Insufficient documentation

## 2013-10-17 DIAGNOSIS — Z3689 Encounter for other specified antenatal screening: Secondary | ICD-10-CM | POA: Insufficient documentation

## 2013-10-17 DIAGNOSIS — Z3400 Encounter for supervision of normal first pregnancy, unspecified trimester: Secondary | ICD-10-CM

## 2013-10-17 DIAGNOSIS — O9989 Other specified diseases and conditions complicating pregnancy, childbirth and the puerperium: Principal | ICD-10-CM

## 2013-10-17 DIAGNOSIS — O99891 Other specified diseases and conditions complicating pregnancy: Secondary | ICD-10-CM | POA: Insufficient documentation

## 2013-10-17 NOTE — MAU Note (Signed)
Pt states a part of a baby crib hit her abdomen while she was trying to put it together"all by myself"

## 2013-10-17 NOTE — MAU Provider Note (Signed)
Chief Complaint:  Abdominal Injury   First Provider Initiated Contact with Patient 10/17/13 2216      HPI: Melanie Galloway is a 23 y.o. G1P0 at 7831w4dwho presents to maternity admissions reporting she was hit in the abdomen today by the side of a crib she was putting together by herself.  She reports the crib side fell out of the box and hit her in the right mid abdomen.  She has soreness at the location where she was hit, but denies contractions or other pain.  She reports good fetal movement, denies LOF, vaginal bleeding, vaginal itching/burning, urinary symptoms, h/a, dizziness, n/v, or fever/chills.    Past Medical History: Past Medical History  Diagnosis Date  . Stomach ulcer   . Migraines   . Asthma     Past obstetric history: OB History  Gravida Para Term Preterm AB SAB TAB Ectopic Multiple Living  1             # Outcome Date GA Lbr Len/2nd Weight Sex Delivery Anes PTL Lv  1 CUR               Past Surgical History: Past Surgical History  Procedure Laterality Date  . Eye muscle surgery    . External ear surgery      Family History: Family History  Problem Relation Age of Onset  . Cancer Mother   . Asthma Sister     Social History: History  Substance Use Topics  . Smoking status: Former Smoker    Types: Cigarettes    Quit date: 04/26/2013  . Smokeless tobacco: Never Used  . Alcohol Use: No    Allergies: No Known Allergies  Meds:  Facility-administered medications prior to admission  Medication Dose Route Frequency Provider Last Rate Last Dose  . Tdap (BOOSTRIX) injection 0.5 mL  0.5 mL Intramuscular Once Danae Orleanseirdre C Poe, CNM       Prescriptions prior to admission  Medication Sig Dispense Refill  . albuterol (PROVENTIL HFA;VENTOLIN HFA) 108 (90 BASE) MCG/ACT inhaler Inhale 1-2 puffs into the lungs every 6 (six) hours as needed for wheezing or shortness of breath.  1 Inhaler  1  . butalbital-acetaminophen-caffeine (FIORICET, ESGIC) 50-325-40 MG per tablet  Take 2 tablets by mouth every 4 (four) hours as needed for migraine.      . Prenatal Vit-Fe Fumarate-FA (PRENATAL MULTIVITAMIN) TABS tablet Take 1 tablet by mouth daily at 12 noon.        ROS: Pertinent findings in history of present illness.  Physical Exam  Height 5\' 6"  (1.676 m), weight 59.875 kg (132 lb), last menstrual period 02/25/2013. GENERAL: Well-developed, well-nourished female in no acute distress.  HEENT: normocephalic HEART: normal rate RESP: normal effort ABDOMEN: Soft, non-tender, gravid appropriate for gestational age EXTREMITIES: Nontender, no edema NEURO: alert and oriented SPECULUM EXAM: NEFG, physiologic discharge, no blood, cervix clean    FHT:  Baseline 135, moderate variability, accelerations present, no decelerations Contractions: None on toco or to palpation   Labs: Blood type AB positive  Imaging:   Preliminary U/S report without abnormalities   Assessment: 1. Traumatic injury during pregnancy in third trimester    Plan: Discharge home  PTL and abruption precautions and fetal kick counts F/U in clinic as scheduled next week Return to MAU as needed for emergencies    Medication List    ASK your doctor about these medications       albuterol 108 (90 BASE) MCG/ACT inhaler  Commonly known as:  PROVENTIL  HFA;VENTOLIN HFA  Inhale 1-2 puffs into the lungs every 6 (six) hours as needed for wheezing or shortness of breath.     butalbital-acetaminophen-caffeine 50-325-40 MG per tablet  Commonly known as:  FIORICET, ESGIC  Take 2 tablets by mouth every 4 (four) hours as needed for migraine.     prenatal multivitamin Tabs tablet  Take 1 tablet by mouth daily at 12 noon.        Sharen CounterLisa Leftwich-Kirby Certified Nurse-Midwife 10/17/2013 10:17 PM

## 2013-10-17 NOTE — MAU Note (Signed)
Pt states the side of a crib fell on her abdomen. Sore where hit. Denies contractions/vaginal bleeding/LOF. Positive fetal movement. Had ultrasound today that was normal but states was hit in abdomen where the baby's head is.

## 2013-10-18 DIAGNOSIS — T1490XA Injury, unspecified, initial encounter: Secondary | ICD-10-CM

## 2013-10-18 DIAGNOSIS — O9989 Other specified diseases and conditions complicating pregnancy, childbirth and the puerperium: Principal | ICD-10-CM

## 2013-10-18 DIAGNOSIS — O99891 Other specified diseases and conditions complicating pregnancy: Secondary | ICD-10-CM

## 2013-10-18 DIAGNOSIS — R1084 Generalized abdominal pain: Secondary | ICD-10-CM

## 2013-10-18 NOTE — Discharge Instructions (Signed)
Injuries In Pregnancy °Trauma is the most common cause of injury and death in pregnant women. The most common cause of death to the fetus is injury and death of the pregnant mother.  °Minor falls and minor automobile accidents do not usually harm the fetus. The fetus is protected in the womb by a sac filled with fluid. The fetus can be harmed if there is direct trauma to your abdomen and pelvis. Direct trauma to the uterus and placenta can affect the blood supply to the fetus. Major trauma causing significant bleeding and shock to the mother can also compromise and jeopardize the fetal blood supply. °It is important to know your blood type and the father's blood type in case you develop vaginal bleeding. If you are RH negative and have sustained serious trauma or develop vaginal bleeding, you will need to have medicine (RhoGAM [Rh immune globulin]) to avoid Rh problems in future pregnancies. °CAUSES °· Falls are more common in the second and third trimester of the pregnancy. Factors that increase your risk of falling include: °· Increase in weight. °· The change of your center of gravity. °· Tripping over an object that cannot be seen. °· Automobile accidents. It is important to wear a seat belt and always practice safe driving. °· Domestic violence or assault. Dial your local emergency services (911 in the [place]). Spousal abuse can be a significant cause of trauma during pregnancy. °· Burns (fire or electrical). Avoid fires, starting fires, lifting heavy pots of boiling or hot liquids, and fixing electrical problems. °The most common causes of death to the pregnant woman include: °· Injuries that cause severe bleeding, shock and loss of blood flow to the mother's major organs. °· Head and neck injuries that result in severe brain or spinal damage. °· Chest trauma that can cause direct injury to the heart and lungs or any injury that effects the area enclosed by the ribs (thorax). Trauma to this area can result in  cardio-respiratory arrest. °Symptoms and treatment will depend on the type of injury. °HOME CARE INSTRUCTIONS  °· Call your caregiver if you are in a car accident, even if you think you and the baby are not hurt. Your caregiver may want you to have a precautionary evaluation. °· Do not take aspirin. It can worsen bleeding. °· You may apply cold packs 3 to 4 times a day to the injury with your caregiver's permission. °· After 24 hours apply warm compresses to the injured site with your caregiver's permission. °· Call your caregiver if you are having increasing pain in any part of your body that is not remedied by your instructed home care. °· In a severe injury, try to have someone be with you and help you until you are able to take care of yourself. °· Do not wear high heel shoes while pregnant. °· Remove slippery rugs and loose objects on the floor. °SEEK IMMEDIATE MEDICAL CARE IF:  °· You have been assaulted (domestic or otherwise). °· You have been in a car accident. °· You develop vaginal bleeding. °· You develop fluid leaking from the vagina. °· You develop uterine contractions (pelvic cramping or pain). °· You develop neck stiffness or pain. °· You become weak or faint, or have uncontrolled vomiting after trauma. °· You had a serious burn. This includes burns to the face, neck, hands or genitals, or burns greater than the size of your palm anywhere else. °· You develop a headache or vision problems after a fall or from   other trauma. °· You do not feel the baby moving or the baby is not moving as much as before. °Document Released: 07/02/2004 Document Revised: 08/17/2011 Document Reviewed: 03/01/2013 °ExitCare® Patient Information ©2014 [employer]. ° °

## 2013-10-19 NOTE — MAU Provider Note (Signed)
Attestation of Attending Supervision of Advanced Practitioner (PA/CNM/NP): Evaluation and management procedures were performed by the Advanced Practitioner under my supervision and collaboration.  I have reviewed the Advanced Practitioner's note and chart, and I agree with the management and plan.  Jusiah Aguayo, MD, FACOG Attending Obstetrician & Gynecologist Faculty Practice, [facility]  

## 2013-10-25 ENCOUNTER — Encounter: Payer: Self-pay | Admitting: Obstetrics and Gynecology

## 2013-10-25 ENCOUNTER — Ambulatory Visit (INDEPENDENT_AMBULATORY_CARE_PROVIDER_SITE_OTHER): Payer: BC Managed Care – PPO | Admitting: Obstetrics and Gynecology

## 2013-10-25 VITALS — BP 107/62 | HR 85 | Temp 98.7°F | Wt 131.6 lb

## 2013-10-25 DIAGNOSIS — Z3400 Encounter for supervision of normal first pregnancy, unspecified trimester: Secondary | ICD-10-CM

## 2013-10-25 LAB — POCT URINALYSIS DIP (DEVICE)
BILIRUBIN URINE: NEGATIVE
GLUCOSE, UA: NEGATIVE mg/dL
Hgb urine dipstick: NEGATIVE
Ketones, ur: NEGATIVE mg/dL
Nitrite: NEGATIVE
Protein, ur: NEGATIVE mg/dL
SPECIFIC GRAVITY, URINE: 1.015 (ref 1.005–1.030)
Urobilinogen, UA: 0.2 mg/dL (ref 0.0–1.0)
pH: 6.5 (ref 5.0–8.0)

## 2013-10-25 LAB — GLUCOSE, CAPILLARY: GLUCOSE-CAPILLARY: 79 mg/dL (ref 70–99)

## 2013-10-25 NOTE — Progress Notes (Signed)
Occasional edema in hands and feet.

## 2013-10-25 NOTE — Patient Instructions (Signed)
Third Trimester of Pregnancy  The third trimester is from week 29 through week 42, months 7 through 9. The third trimester is a time when the fetus is growing rapidly. At the end of the ninth month, the fetus is about 20 inches in length and weighs 6 10 pounds.   BODY CHANGES  Your body goes through many changes during pregnancy. The changes vary from woman to woman.    Your weight will continue to increase. You can expect to gain 25 35 pounds (11 16 kg) by the end of the pregnancy.   You may begin to get stretch marks on your hips, abdomen, and breasts.   You may urinate more often because the fetus is moving lower into your pelvis and pressing on your bladder.   You may develop or continue to have heartburn as a result of your pregnancy.   You may develop constipation because certain hormones are causing the muscles that push waste through your intestines to slow down.   You may develop hemorrhoids or swollen, bulging veins (varicose veins).   You may have pelvic pain because of the weight gain and pregnancy hormones relaxing your joints between the bones in your pelvis. Back aches may result from over exertion of the muscles supporting your posture.   Your breasts will continue to grow and be tender. A yellow discharge may leak from your breasts called colostrum.   Your belly button may stick out.   You may feel short of breath because of your expanding uterus.   You may notice the fetus "dropping," or moving lower in your abdomen.   You may have a bloody mucus discharge. This usually occurs a few days to a week before labor begins.   Your cervix becomes thin and soft (effaced) near your due date.  WHAT TO EXPECT AT YOUR PRENATAL EXAMS   You will have prenatal exams every 2 weeks until week 36. Then, you will have weekly prenatal exams. During a routine prenatal visit:   You will be weighed to make sure you and the fetus are growing normally.   Your blood pressure is taken.   Your abdomen will be  measured to track your baby's growth.   The fetal heartbeat will be listened to.   Any test results from the previous visit will be discussed.   You may have a cervical check near your due date to see if you have effaced.  At around 36 weeks, your caregiver will check your cervix. At the same time, your caregiver will also perform a test on the secretions of the vaginal tissue. This test is to determine if a type of bacteria, Group B streptococcus, is present. Your caregiver will explain this further.  Your caregiver may ask you:   What your birth plan is.   How you are feeling.   If you are feeling the baby move.   If you have had any abnormal symptoms, such as leaking fluid, bleeding, severe headaches, or abdominal cramping.   If you have any questions.  Other tests or screenings that may be performed during your third trimester include:   Blood tests that check for low iron levels (anemia).   Fetal testing to check the health, activity level, and growth of the fetus. Testing is done if you have certain medical conditions or if there are problems during the pregnancy.  FALSE LABOR  You may feel small, irregular contractions that eventually go away. These are called Braxton Hicks contractions, or   false labor. Contractions may last for hours, days, or even weeks before true labor sets in. If contractions come at regular intervals, intensify, or become painful, it is best to be seen by your caregiver.   SIGNS OF LABOR    Menstrual-like cramps.   Contractions that are 5 minutes apart or less.   Contractions that start on the top of the uterus and spread down to the lower abdomen and back.   A sense of increased pelvic pressure or back pain.   A watery or bloody mucus discharge that comes from the vagina.  If you have any of these signs before the 37th week of pregnancy, call your caregiver right away. You need to go to the hospital to get checked immediately.  HOME CARE INSTRUCTIONS    Avoid all  smoking, herbs, alcohol, and unprescribed drugs. These chemicals affect the formation and growth of the baby.   Follow your caregiver's instructions regarding medicine use. There are medicines that are either safe or unsafe to take during pregnancy.   Exercise only as directed by your caregiver. Experiencing uterine cramps is a good sign to stop exercising.   Continue to eat regular, healthy meals.   Wear a good support bra for breast tenderness.   Do not use hot tubs, steam rooms, or saunas.   Wear your seat belt at all times when driving.   Avoid raw meat, uncooked cheese, cat litter boxes, and soil used by cats. These carry germs that can cause birth defects in the baby.   Take your prenatal vitamins.   Try taking a stool softener (if your caregiver approves) if you develop constipation. Eat more high-fiber foods, such as fresh vegetables or fruit and whole grains. Drink plenty of fluids to keep your urine clear or pale yellow.   Take warm sitz baths to soothe any pain or discomfort caused by hemorrhoids. Use hemorrhoid cream if your caregiver approves.   If you develop varicose veins, wear support hose. Elevate your feet for 15 minutes, 3 4 times a day. Limit salt in your diet.   Avoid heavy lifting, wear low heal shoes, and practice good posture.   Rest a lot with your legs elevated if you have leg cramps or low back pain.   Visit your dentist if you have not gone during your pregnancy. Use a soft toothbrush to brush your teeth and be gentle when you floss.   A sexual relationship may be continued unless your caregiver directs you otherwise.   Do not travel far distances unless it is absolutely necessary and only with the approval of your caregiver.   Take prenatal classes to understand, practice, and ask questions about the labor and delivery.   Make a trial run to the hospital.   Pack your hospital bag.   Prepare the baby's nursery.   Continue to go to all your prenatal visits as directed  by your caregiver.  SEEK MEDICAL CARE IF:   You are unsure if you are in labor or if your water has broken.   You have dizziness.   You have mild pelvic cramps, pelvic pressure, or nagging pain in your abdominal area.   You have persistent nausea, vomiting, or diarrhea.   You have a bad smelling vaginal discharge.   You have pain with urination.  SEEK IMMEDIATE MEDICAL CARE IF:    You have a fever.   You are leaking fluid from your vagina.   You have spotting or bleeding from your vagina.     You have severe abdominal cramping or pain.   You have rapid weight loss or gain.   You have shortness of breath with chest pain.   You notice sudden or extreme swelling of your face, hands, ankles, feet, or legs.   You have not felt your baby move in over an hour.   You have severe headaches that do not go away with medicine.   You have vision changes.  Document Released: 05/19/2001 Document Revised: 01/25/2013 Document Reviewed: 07/26/2012  Exit[employer] Patient Information 2014 [employer].

## 2013-10-25 NOTE — Progress Notes (Signed)
1 hr glucola was 100. Dipstick glucosuria today> RCBG: 79 c/w renal glucosuria.  Headaches only occasional, not severe.  Will start CB and breastfeeding classes.

## 2013-10-31 ENCOUNTER — Encounter: Payer: Self-pay | Admitting: *Deleted

## 2013-11-08 ENCOUNTER — Ambulatory Visit (INDEPENDENT_AMBULATORY_CARE_PROVIDER_SITE_OTHER): Payer: BC Managed Care – PPO | Admitting: Advanced Practice Midwife

## 2013-11-08 ENCOUNTER — Encounter: Payer: Self-pay | Admitting: Advanced Practice Midwife

## 2013-11-08 VITALS — BP 124/69 | HR 87 | Temp 98.2°F | Wt 135.0 lb

## 2013-11-08 DIAGNOSIS — Z3400 Encounter for supervision of normal first pregnancy, unspecified trimester: Secondary | ICD-10-CM

## 2013-11-08 DIAGNOSIS — O368190 Decreased fetal movements, unspecified trimester, not applicable or unspecified: Secondary | ICD-10-CM

## 2013-11-08 DIAGNOSIS — N858 Other specified noninflammatory disorders of uterus: Secondary | ICD-10-CM

## 2013-11-08 DIAGNOSIS — N859 Noninflammatory disorder of uterus, unspecified: Secondary | ICD-10-CM

## 2013-11-08 LAB — POCT URINALYSIS DIP (DEVICE)
BILIRUBIN URINE: NEGATIVE
GLUCOSE, UA: 500 mg/dL — AB
Hgb urine dipstick: NEGATIVE
Ketones, ur: NEGATIVE mg/dL
LEUKOCYTES UA: NEGATIVE
Nitrite: NEGATIVE
Protein, ur: NEGATIVE mg/dL
Specific Gravity, Urine: 1.01 (ref 1.005–1.030)
Urobilinogen, UA: 0.2 mg/dL (ref 0.0–1.0)
pH: 5 (ref 5.0–8.0)

## 2013-11-08 MED ORDER — PRENATAL MULTIVITAMIN CH: 1.0000 | ORAL_TABLET | Freq: Every day | ORAL | Status: DC

## 2013-11-08 NOTE — Progress Notes (Signed)
Doing well. Only contracts at work. Reactive NST with rare contractions. Chest clear to auscultation, recommend start daily antihistamine.

## 2013-11-08 NOTE — Patient Instructions (Signed)
Third Trimester of Pregnancy  The third trimester is from week 29 through week 42, months 7 through 9. The third trimester is a time when the fetus is growing rapidly. At the end of the ninth month, the fetus is about 20 inches in length and weighs 6 10 pounds.   BODY CHANGES  Your body goes through many changes during pregnancy. The changes vary from woman to woman.    Your weight will continue to increase. You can expect to gain 25 35 pounds (11 16 kg) by the end of the pregnancy.   You may begin to get stretch marks on your hips, abdomen, and breasts.   You may urinate more often because the fetus is moving lower into your pelvis and pressing on your bladder.   You may develop or continue to have heartburn as a result of your pregnancy.   You may develop constipation because certain hormones are causing the muscles that push waste through your intestines to slow down.   You may develop hemorrhoids or swollen, bulging veins (varicose veins).   You may have pelvic pain because of the weight gain and pregnancy hormones relaxing your joints between the bones in your pelvis. Back aches may result from over exertion of the muscles supporting your posture.   Your breasts will continue to grow and be tender. A yellow discharge may leak from your breasts called colostrum.   Your belly button may stick out.   You may feel short of breath because of your expanding uterus.   You may notice the fetus "dropping," or moving lower in your abdomen.   You may have a bloody mucus discharge. This usually occurs a few days to a week before labor begins.   Your cervix becomes thin and soft (effaced) near your due date.  WHAT TO EXPECT AT YOUR PRENATAL EXAMS   You will have prenatal exams every 2 weeks until week 36. Then, you will have weekly prenatal exams. During a routine prenatal visit:   You will be weighed to make sure you and the fetus are growing normally.   Your blood pressure is taken.   Your abdomen will be  measured to track your baby's growth.   The fetal heartbeat will be listened to.   Any test results from the previous visit will be discussed.   You may have a cervical check near your due date to see if you have effaced.  At around 36 weeks, your caregiver will check your cervix. At the same time, your caregiver will also perform a test on the secretions of the vaginal tissue. This test is to determine if a type of bacteria, Group B streptococcus, is present. Your caregiver will explain this further.  Your caregiver may ask you:   What your birth plan is.   How you are feeling.   If you are feeling the baby move.   If you have had any abnormal symptoms, such as leaking fluid, bleeding, severe headaches, or abdominal cramping.   If you have any questions.  Other tests or screenings that may be performed during your third trimester include:   Blood tests that check for low iron levels (anemia).   Fetal testing to check the health, activity level, and growth of the fetus. Testing is done if you have certain medical conditions or if there are problems during the pregnancy.  FALSE LABOR  You may feel small, irregular contractions that eventually go away. These are called Braxton Hicks contractions, or   false labor. Contractions may last for hours, days, or even weeks before true labor sets in. If contractions come at regular intervals, intensify, or become painful, it is best to be seen by your caregiver.   SIGNS OF LABOR    Menstrual-like cramps.   Contractions that are 5 minutes apart or less.   Contractions that start on the top of the uterus and spread down to the lower abdomen and back.   A sense of increased pelvic pressure or back pain.   A watery or bloody mucus discharge that comes from the vagina.  If you have any of these signs before the 37th week of pregnancy, call your caregiver right away. You need to go to the hospital to get checked immediately.  HOME CARE INSTRUCTIONS    Avoid all  smoking, herbs, alcohol, and unprescribed drugs. These chemicals affect the formation and growth of the baby.   Follow your caregiver's instructions regarding medicine use. There are medicines that are either safe or unsafe to take during pregnancy.   Exercise only as directed by your caregiver. Experiencing uterine cramps is a good sign to stop exercising.   Continue to eat regular, healthy meals.   Wear a good support bra for breast tenderness.   Do not use hot tubs, steam rooms, or saunas.   Wear your seat belt at all times when driving.   Avoid raw meat, uncooked cheese, cat litter boxes, and soil used by cats. These carry germs that can cause birth defects in the baby.   Take your prenatal vitamins.   Try taking a stool softener (if your caregiver approves) if you develop constipation. Eat more high-fiber foods, such as fresh vegetables or fruit and whole grains. Drink plenty of fluids to keep your urine clear or pale yellow.   Take warm sitz baths to soothe any pain or discomfort caused by hemorrhoids. Use hemorrhoid cream if your caregiver approves.   If you develop varicose veins, wear support hose. Elevate your feet for 15 minutes, 3 4 times a day. Limit salt in your diet.   Avoid heavy lifting, wear low heal shoes, and practice good posture.   Rest a lot with your legs elevated if you have leg cramps or low back pain.   Visit your dentist if you have not gone during your pregnancy. Use a soft toothbrush to brush your teeth and be gentle when you floss.   A sexual relationship may be continued unless your caregiver directs you otherwise.   Do not travel far distances unless it is absolutely necessary and only with the approval of your caregiver.   Take prenatal classes to understand, practice, and ask questions about the labor and delivery.   Make a trial run to the hospital.   Pack your hospital bag.   Prepare the baby's nursery.   Continue to go to all your prenatal visits as directed  by your caregiver.  SEEK MEDICAL CARE IF:   You are unsure if you are in labor or if your water has broken.   You have dizziness.   You have mild pelvic cramps, pelvic pressure, or nagging pain in your abdominal area.   You have persistent nausea, vomiting, or diarrhea.   You have a bad smelling vaginal discharge.   You have pain with urination.  SEEK IMMEDIATE MEDICAL CARE IF:    You have a fever.   You are leaking fluid from your vagina.   You have spotting or bleeding from your vagina.     You have severe abdominal cramping or pain.   You have rapid weight loss or gain.   You have shortness of breath with chest pain.   You notice sudden or extreme swelling of your face, hands, ankles, feet, or legs.   You have not felt your baby move in over an hour.   You have severe headaches that do not go away with medicine.   You have vision changes.  Document Released: 05/19/2001 Document Revised: 01/25/2013 Document Reviewed: 07/26/2012  Exit[employer] Patient Information 2014 [employer].

## 2013-11-08 NOTE — Progress Notes (Signed)
States only feels baby move about twice a day for about a week, before that was 8-10 times in hour. Also c/o sob, hx of asthma, denies wheezing.  C/o pelvic pain and epigastric pain. Oxygen saturation 98%. C/o lower back pain x 3 days.

## 2013-11-10 ENCOUNTER — Telehealth: Payer: Self-pay

## 2013-11-10 NOTE — Telephone Encounter (Signed)
Called pt and left message with her mother that her FMLA paperwork is completed and that she can come pick them up but our office is closed at noon today.  Mother stated understanding.

## 2013-11-22 ENCOUNTER — Encounter: Payer: Self-pay | Admitting: Advanced Practice Midwife

## 2013-11-22 ENCOUNTER — Encounter: Payer: Self-pay | Admitting: *Deleted

## 2013-11-22 ENCOUNTER — Ambulatory Visit (INDEPENDENT_AMBULATORY_CARE_PROVIDER_SITE_OTHER): Payer: BC Managed Care – PPO | Admitting: Advanced Practice Midwife

## 2013-11-22 VITALS — BP 111/60 | HR 73 | Wt 136.6 lb

## 2013-11-22 DIAGNOSIS — Z3490 Encounter for supervision of normal pregnancy, unspecified, unspecified trimester: Secondary | ICD-10-CM

## 2013-11-22 DIAGNOSIS — Z3480 Encounter for supervision of other normal pregnancy, unspecified trimester: Secondary | ICD-10-CM

## 2013-11-22 LAB — OB RESULTS CONSOLE GBS: GBS: NEGATIVE

## 2013-11-22 LAB — POCT URINALYSIS DIP (DEVICE)
Bilirubin Urine: NEGATIVE
GLUCOSE, UA: NEGATIVE mg/dL
Hgb urine dipstick: NEGATIVE
KETONES UR: NEGATIVE mg/dL
Nitrite: NEGATIVE
Protein, ur: NEGATIVE mg/dL
SPECIFIC GRAVITY, URINE: 1.015 (ref 1.005–1.030)
Urobilinogen, UA: 0.2 mg/dL (ref 0.0–1.0)
pH: 7 (ref 5.0–8.0)

## 2013-11-22 LAB — OB RESULTS CONSOLE GC/CHLAMYDIA
CHLAMYDIA, DNA PROBE: NEGATIVE
GC PROBE AMP, GENITAL: NEGATIVE

## 2013-11-22 NOTE — Addendum Note (Signed)
Addended by: Candelaria StagersHAIZLIP, CANDACE E on: 11/22/2013 10:25 AM   Modules accepted: Orders

## 2013-11-22 NOTE — Progress Notes (Signed)
Asking about induction on or before 6/29 due to husband's deployment overseas.  I discussed we don't usually do until 41 wks. Discussed risks of induction. Told her we will let her know next visit.  After she left I verified her EDC based on 12 wks US. Will be 38.2wks on 12/03/13.   Will tell her next week we cannot IOL at 38 wks, per Dr Debroah LoopArnold. She thinks her due date is 12/09/13

## 2013-11-22 NOTE — Progress Notes (Signed)
Patient reports that she only feels baby move about 4-5 a day.  Has pain around her navel.

## 2013-11-22 NOTE — Patient Instructions (Signed)
Third Trimester of Pregnancy The third trimester is from week 29 through week 42, months 7 through 9. The third trimester is a time when the fetus is growing rapidly. At the end of the ninth month, the fetus is about 20 inches in length and weighs 6-10 pounds.  BODY CHANGES Your body goes through many changes during pregnancy. The changes vary from woman to woman.   Your weight will continue to increase. You can expect to gain 25-35 pounds (11-16 kg) by the end of the pregnancy.  You may begin to get stretch marks on your hips, abdomen, and breasts.  You may urinate more often because the fetus is moving lower into your pelvis and pressing on your bladder.  You may develop or continue to have heartburn as a result of your pregnancy.  You may develop constipation because certain hormones are causing the muscles that push waste through your intestines to slow down.  You may develop hemorrhoids or swollen, bulging veins (varicose veins).  You may have pelvic pain because of the weight gain and pregnancy hormones relaxing your joints between the bones in your pelvis. Backaches may result from overexertion of the muscles supporting your posture.  You may have changes in your hair. These can include thickening of your hair, rapid growth, and changes in texture. Some women also have hair loss during or after pregnancy, or hair that feels dry or thin. Your hair will most likely return to normal after your baby is born.  Your breasts will continue to grow and be tender. A yellow discharge may leak from your breasts called colostrum.  Your belly button may stick out.  You may feel short of breath because of your expanding uterus.  You may notice the fetus "dropping," or moving lower in your abdomen.  You may have a bloody mucus discharge. This usually occurs a few days to a week before labor begins.  Your cervix becomes thin and soft (effaced) near your due date. WHAT TO EXPECT AT YOUR PRENATAL  EXAMS  You will have prenatal exams every 2 weeks until week 36. Then, you will have weekly prenatal exams. During a routine prenatal visit:  You will be weighed to make sure you and the fetus are growing normally.  Your blood pressure is taken.  Your abdomen will be measured to track your baby's growth.  The fetal heartbeat will be listened to.  Any test results from the previous visit will be discussed.  You may have a cervical check near your due date to see if you have effaced. At around 36 weeks, your caregiver will check your cervix. At the same time, your caregiver will also perform a test on the secretions of the vaginal tissue. This test is to determine if a type of bacteria, Group B streptococcus, is present. Your caregiver will explain this further. Your caregiver may ask you:  What your birth plan is.  How you are feeling.  If you are feeling the baby move.  If you have had any abnormal symptoms, such as leaking fluid, bleeding, severe headaches, or abdominal cramping.  If you have any questions. Other tests or screenings that may be performed during your third trimester include:  Blood tests that check for low iron levels (anemia).  Fetal testing to check the health, activity level, and growth of the fetus. Testing is done if you have certain medical conditions or if there are problems during the pregnancy. FALSE LABOR You may feel small, irregular contractions that   eventually go away. These are called Braxton Hicks contractions, or false labor. Contractions may last for hours, days, or even weeks before true labor sets in. If contractions come at regular intervals, intensify, or become painful, it is best to be seen by your caregiver.  SIGNS OF LABOR   Menstrual-like cramps.  Contractions that are 5 minutes apart or less.  Contractions that start on the top of the uterus and spread down to the lower abdomen and back.  A sense of increased pelvic pressure or back  pain.  A watery or bloody mucus discharge that comes from the vagina. If you have any of these signs before the 37th week of pregnancy, call your caregiver right away. You need to go to the hospital to get checked immediately. HOME CARE INSTRUCTIONS   Avoid all smoking, herbs, alcohol, and unprescribed drugs. These chemicals affect the formation and growth of the baby.  Follow your caregiver's instructions regarding medicine use. There are medicines that are either safe or unsafe to take during pregnancy.  Exercise only as directed by your caregiver. Experiencing uterine cramps is a good sign to stop exercising.  Continue to eat regular, healthy meals.  Wear a good support bra for breast tenderness.  Do not use hot tubs, steam rooms, or saunas.  Wear your seat belt at all times when driving.  Avoid raw meat, uncooked cheese, cat litter boxes, and soil used by cats. These carry germs that can cause birth defects in the baby.  Take your prenatal vitamins.  Try taking a stool softener (if your caregiver approves) if you develop constipation. Eat more high-fiber foods, such as fresh vegetables or fruit and whole grains. Drink plenty of fluids to keep your urine clear or pale yellow.  Take warm sitz baths to soothe any pain or discomfort caused by hemorrhoids. Use hemorrhoid cream if your caregiver approves.  If you develop varicose veins, wear support hose. Elevate your feet for 15 minutes, 3-4 times a day. Limit salt in your diet.  Avoid heavy lifting, wear low heal shoes, and practice good posture.  Rest a lot with your legs elevated if you have leg cramps or low back pain.  Visit your dentist if you have not gone during your pregnancy. Use a soft toothbrush to brush your teeth and be gentle when you floss.  A sexual relationship may be continued unless your caregiver directs you otherwise.  Do not travel far distances unless it is absolutely necessary and only with the approval  of your caregiver.  Take prenatal classes to understand, practice, and ask questions about the labor and delivery.  Make a trial run to the hospital.  Pack your hospital bag.  Prepare the baby's nursery.  Continue to go to all your prenatal visits as directed by your caregiver. SEEK MEDICAL CARE IF:  You are unsure if you are in labor or if your water has broken.  You have dizziness.  You have mild pelvic cramps, pelvic pressure, or nagging pain in your abdominal area.  You have persistent nausea, vomiting, or diarrhea.  You have a bad smelling vaginal discharge.  You have pain with urination. SEEK IMMEDIATE MEDICAL CARE IF:   You have a fever.  You are leaking fluid from your vagina.  You have spotting or bleeding from your vagina.  You have severe abdominal cramping or pain.  You have rapid weight loss or gain.  You have shortness of breath with chest pain.  You notice sudden or extreme swelling   of your face, hands, ankles, feet, or legs.  You have not felt your baby move in over an hour.  You have severe headaches that do not go away with medicine.  You have vision changes. Document Released: 05/19/2001 Document Revised: 05/30/2013 Document Reviewed: 07/26/2012 ExitCare Patient Information 2015 [employer]. This information is not intended to replace advice given to you by your health care provider. Make sure you discuss any questions you have with your health care provider.  

## 2013-11-24 LAB — CULTURE, BETA STREP (GROUP B ONLY)

## 2013-11-25 LAB — GC/CHLAMYDIA PROBE AMP
CT Probe RNA: NEGATIVE
GC PROBE AMP APTIMA: NEGATIVE

## 2013-11-27 ENCOUNTER — Encounter (HOSPITAL_COMMUNITY): Payer: Self-pay | Admitting: *Deleted

## 2013-11-27 ENCOUNTER — Telehealth: Payer: Self-pay

## 2013-11-27 ENCOUNTER — Inpatient Hospital Stay (HOSPITAL_COMMUNITY)
Admission: AD | Admit: 2013-11-27 | Discharge: 2013-11-27 | Disposition: A | Discharge status: Home / Self Care | Payer: BC Managed Care – PPO | Source: Ambulatory Visit | Attending: Obstetrics & Gynecology | Admitting: Obstetrics & Gynecology

## 2013-11-27 DIAGNOSIS — O368131 Decreased fetal movements, third trimester, fetus 1: Secondary | ICD-10-CM

## 2013-11-27 DIAGNOSIS — O368190 Decreased fetal movements, unspecified trimester, not applicable or unspecified: Secondary | ICD-10-CM

## 2013-11-27 DIAGNOSIS — O3090 Multiple gestation, unspecified, unspecified trimester: Secondary | ICD-10-CM

## 2013-11-27 DIAGNOSIS — Z87891 Personal history of nicotine dependence: Secondary | ICD-10-CM | POA: Insufficient documentation

## 2013-11-27 NOTE — Telephone Encounter (Signed)
Patient called stating she was seen last Wednesday and the midwife told her she was going to look in to having patient induced 5 days before due date due to husband being [place] seas on 12/04/13. [facility], however, there were no appointments available so she will not hear until 12/07/13. Would like a call back.   Per chart review, Cindee SaltMarie Galloway, CNM stated we would not be able to do IOL. Called patient and informed her of this. Patient verbalized understanding. No further questions or concerns.

## 2013-11-27 NOTE — MAU Provider Note (Signed)
History     CSN: [id number]  Arrival date and time: 11/27/13 1504   None     No chief complaint on file.  HPI This is a 23 y.o. female at 3316w3d who presents with c/o decreased fetal movement. Has some cramps but not too many painful contractions. No leaking or bleeding.   Pregnancy has been followed [facility]   OB History   Grav Para Term Preterm Abortions TAB SAB Ect Mult Living   1               Past Medical History  Diagnosis Date  . Stomach ulcer   . Migraines   . Asthma     Past Surgical History  Procedure Laterality Date  . Eye muscle surgery    . External ear surgery      Family History  Problem Relation Age of Onset  . Cancer Mother   . Asthma Sister     History  Substance Use Topics  . Smoking status: Former Smoker    Types: Cigarettes    Quit date: 04/26/2013  . Smokeless tobacco: Never Used  . Alcohol Use: No    Allergies: No Known Allergies  Facility-administered medications prior to admission  Medication Dose Route Frequency Provider Last Rate Last Dose  . Tdap (BOOSTRIX) injection 0.5 mL  0.5 mL Intramuscular Once Danae Orleanseirdre C Poe, CNM       Prescriptions prior to admission  Medication Sig Dispense Refill  . calcium carbonate (TUMS - DOSED IN MG ELEMENTAL CALCIUM) 500 MG chewable tablet Chew 2 tablets by mouth daily as needed for indigestion or heartburn.      . Prenatal Vit-Fe Fumarate-FA (PRENATAL MULTIVITAMIN) TABS tablet Take 1 tablet by mouth daily at 12 noon.  30 tablet  6  . albuterol (PROVENTIL HFA;VENTOLIN HFA) 108 (90 BASE) MCG/ACT inhaler Inhale 1-2 puffs into the lungs every 6 (six) hours as needed for wheezing or shortness of breath.  1 Inhaler  1  . butalbital-acetaminophen-caffeine (FIORICET, ESGIC) 50-325-40 MG per tablet Take 2 tablets by mouth every 4 (four) hours as needed for migraine.      . SUMAtriptan (IMITREX) 100 MG tablet         Review of Systems  Constitutional: Negative for fever, chills and  malaise/fatigue.  Gastrointestinal: Negative for nausea, vomiting, abdominal pain, diarrhea and constipation.  Genitourinary: Negative for dysuria.  Neurological: Negative for dizziness.   Physical Exam   Blood pressure 119/72, pulse 101, temperature 98.5 F (36.9 C), temperature source Oral, resp. rate 16, last menstrual period 02/25/2013.  Physical Exam  Constitutional: She is oriented to person, place, and time. She appears well-developed and well-nourished. No distress.  HENT:  Head: Normocephalic.  Cardiovascular: Normal rate.   Respiratory: Effort normal.  GI: Soft. She exhibits no distension. There is no tenderness. There is no rebound and no guarding.  Genitourinary: Vagina normal and uterus normal. No vaginal discharge found.  FHR reactive Irregular cramping  Musculoskeletal: Normal range of motion.  Neurological: She is alert and oriented to person, place, and time.  Skin: Skin is warm and dry.  Psychiatric: She has a normal mood and affect.   Dilation: 1 Effacement (%): 60 Cervical Position: Posterior Station: -2 Presentation: Vertex Exam by:: Keimya Briddell CNM  MAU Course  Procedures   Assessment and Plan  A:  SIUP at 1616w3d       Reactive FHR tracing       Not in labor  P:  Discussed  findings       Has appt coming up in clinic soon       Come back if movement decreases again       Labor precautions  [facility]ILL[facility],Markees Carns 11/27/2013, 3:59 PM

## 2013-11-27 NOTE — Discharge Instructions (Signed)

## 2013-11-27 NOTE — MAU Provider Note (Signed)
Attestation of Attending Supervision of Advanced Practitioner (CNM/NP): Evaluation and management procedures were performed by the Advanced Practitioner under my supervision and collaboration. I have reviewed the Advanced Practitioner's note and chart, and I agree with the management and plan.  LEGGETT,KELLY H. 5:23 PM

## 2013-12-06 ENCOUNTER — Encounter (HOSPITAL_COMMUNITY): Payer: Self-pay | Admitting: *Deleted

## 2013-12-06 ENCOUNTER — Inpatient Hospital Stay (HOSPITAL_COMMUNITY)
Admission: AD | Admit: 2013-12-06 | Discharge: 2013-12-06 | Disposition: A | Discharge status: Home / Self Care | Payer: BC Managed Care – PPO | Source: Ambulatory Visit | Attending: Family Medicine | Admitting: Family Medicine

## 2013-12-06 DIAGNOSIS — O368190 Decreased fetal movements, unspecified trimester, not applicable or unspecified: Secondary | ICD-10-CM | POA: Insufficient documentation

## 2013-12-06 NOTE — MAU Note (Signed)
Patient states she woke up this am and was wet with clear fluid. Has not leaked anything since. Denies having any contractions. States she has not felt fetal movement yet this am but that is not unusual for her baby.

## 2013-12-06 NOTE — MAU Note (Signed)
Urine in lab 

## 2013-12-06 NOTE — Discharge Instructions (Signed)
Third Trimester of Pregnancy °The third trimester is from week 29 through week 42, months 7 through 9. The third trimester is a time when the fetus is growing rapidly. At the end of the ninth month, the fetus is about 20 inches in length and weighs 6-10 pounds.  °BODY CHANGES °Your body goes through many changes during pregnancy. The changes vary from woman to woman.  °· Your weight will continue to increase. You can expect to gain 25-35 pounds (11-16 kg) by the end of the pregnancy. °· You may begin to get stretch marks on your hips, abdomen, and breasts. °· You may urinate more often because the fetus is moving lower into your pelvis and pressing on your bladder. °· You may develop or continue to have heartburn as a result of your pregnancy. °· You may develop constipation because certain hormones are causing the muscles that push waste through your intestines to slow down. °· You may develop hemorrhoids or swollen, bulging veins (varicose veins). °· You may have pelvic pain because of the weight gain and pregnancy hormones relaxing your joints between the bones in your pelvis. Backaches may result from overexertion of the muscles supporting your posture. °· You may have changes in your hair. These can include thickening of your hair, rapid growth, and changes in texture. Some women also have hair loss during or after pregnancy, or hair that feels dry or thin. Your hair will most likely return to normal after your baby is born. °· Your breasts will continue to grow and be tender. A yellow discharge may leak from your breasts called colostrum. °· Your belly button may stick out. °· You may feel short of breath because of your expanding uterus. °· You may notice the fetus "dropping," or moving lower in your abdomen. °· You may have a bloody mucus discharge. This usually occurs a few days to a week before labor begins. °· Your cervix becomes thin and soft (effaced) near your due date. °WHAT TO EXPECT AT YOUR PRENATAL  EXAMS  °You will have prenatal exams every 2 weeks until week 36. Then, you will have weekly prenatal exams. During a routine prenatal visit: °· You will be weighed to make sure you and the fetus are growing normally. °· Your blood pressure is taken. °· Your abdomen will be measured to track your baby's growth. °· The fetal heartbeat will be listened to. °· Any test results from the previous visit will be discussed. °· You may have a cervical check near your due date to see if you have effaced. °At around 36 weeks, your caregiver will check your cervix. At the same time, your caregiver will also perform a test on the secretions of the vaginal tissue. This test is to determine if a type of bacteria, Group B streptococcus, is present. Your caregiver will explain this further. °Your caregiver may ask you: °· What your birth plan is. °· How you are feeling. °· If you are feeling the baby move. °· If you have had any abnormal symptoms, such as leaking fluid, bleeding, severe headaches, or abdominal cramping. °· If you have any questions. °Other tests or screenings that may be performed during your third trimester include: °· Blood tests that check for low iron levels (anemia). °· Fetal testing to check the health, activity level, and growth of the fetus. Testing is done if you have certain medical conditions or if there are problems during the pregnancy. °FALSE LABOR °You may feel small, irregular contractions that   eventually go away. These are called Braxton Hicks contractions, or false labor. Contractions may last for hours, days, or even weeks before true labor sets in. If contractions come at regular intervals, intensify, or become painful, it is best to be seen by your caregiver.  °SIGNS OF LABOR  °· Menstrual-like cramps. °· Contractions that are 5 minutes apart or less. °· Contractions that start on the top of the uterus and spread down to the lower abdomen and back. °· A sense of increased pelvic pressure or back  pain. °· A watery or bloody mucus discharge that comes from the vagina. °If you have any of these signs before the 37th week of pregnancy, call your caregiver right away. You need to go to the hospital to get checked immediately. °HOME CARE INSTRUCTIONS  °· Avoid all smoking, herbs, alcohol, and unprescribed drugs. These chemicals affect the formation and growth of the baby. °· Follow your caregiver's instructions regarding medicine use. There are medicines that are either safe or unsafe to take during pregnancy. °· Exercise only as directed by your caregiver. Experiencing uterine cramps is a good sign to stop exercising. °· Continue to eat regular, healthy meals. °· Wear a good support bra for breast tenderness. °· Do not use hot tubs, steam rooms, or saunas. °· Wear your seat belt at all times when driving. °· Avoid raw meat, uncooked cheese, cat litter boxes, and soil used by cats. These carry germs that can cause birth defects in the baby. °· Take your prenatal vitamins. °· Try taking a stool softener (if your caregiver approves) if you develop constipation. Eat more high-fiber foods, such as fresh vegetables or fruit and whole grains. Drink plenty of fluids to keep your urine clear or pale yellow. °· Take warm sitz baths to soothe any pain or discomfort caused by hemorrhoids. Use hemorrhoid cream if your caregiver approves. °· If you develop varicose veins, wear support hose. Elevate your feet for 15 minutes, 3-4 times a day. Limit salt in your diet. °· Avoid heavy lifting, wear low heal shoes, and practice good posture. °· Rest a lot with your legs elevated if you have leg cramps or low back pain. °· Visit your dentist if you have not gone during your pregnancy. Use a soft toothbrush to brush your teeth and be gentle when you floss. °· A sexual relationship may be continued unless your caregiver directs you otherwise. °· Do not travel far distances unless it is absolutely necessary and only with the approval  of your caregiver. °· Take prenatal classes to understand, practice, and ask questions about the labor and delivery. °· Make a trial run to the hospital. °· Pack your hospital bag. °· Prepare the baby's nursery. °· Continue to go to all your prenatal visits as directed by your caregiver. °SEEK MEDICAL CARE IF: °· You are unsure if you are in labor or if your water has broken. °· You have dizziness. °· You have mild pelvic cramps, pelvic pressure, or nagging pain in your abdominal area. °· You have persistent nausea, vomiting, or diarrhea. °· You have a bad smelling vaginal discharge. °· You have pain with urination. °SEEK IMMEDIATE MEDICAL CARE IF:  °· You have a fever. °· You are leaking fluid from your vagina. °· You have spotting or bleeding from your vagina. °· You have severe abdominal cramping or pain. °· You have rapid weight loss or gain. °· You have shortness of breath with chest pain. °· You notice sudden or extreme swelling   of your face, hands, ankles, feet, or legs. °· You have not felt your baby move in over an hour. °· You have severe headaches that do not go away with medicine. °· You have vision changes. °Document Released: 05/19/2001 Document Revised: 05/30/2013 Document Reviewed: 07/26/2012 °[employer]® Patient Information ©2015 [employer]. This information is not intended to replace advice given to you by your health care provider. Make sure you discuss any questions you have with your health care provider. °Fetal Movement Counts °Patient Name: __________________________________________________ Patient Due Date: ____________________ °Performing a fetal movement count is highly recommended in high-risk pregnancies, but it is good for every pregnant woman to do. Your caregiver may ask you to start counting fetal movements at 28 weeks of the pregnancy. Fetal movements often increase: °· After eating a full meal. °· After physical activity. °· After eating or drinking something sweet or cold. °· At  rest. °Pay attention to when you feel the baby is most active. This will help you notice a pattern of your baby's sleep and wake cycles and what factors contribute to an increase in fetal movement. It is important to perform a fetal movement count at the same time each day when your baby is normally most active.  °HOW TO COUNT FETAL MOVEMENTS °1. Find a quiet and comfortable area to sit or lie down on your left side. Lying on your left side provides the best blood and oxygen circulation to your baby. °2. Write down the day and time on a sheet of paper or in a journal. °3. Start counting kicks, flutters, swishes, rolls, or jabs in a 2 hour period. You should feel at least 10 movements within 2 hours. °4. If you do not feel 10 movements in 2 hours, wait 2-3 hours and count again. Look for a change in the pattern or not enough counts in 2 hours. °SEEK MEDICAL CARE IF: °· You feel less than 10 counts in 2 hours, tried twice. °· There is no movement in over an hour. °· The pattern is changing or taking longer each day to reach 10 counts in 2 hours. °· You feel the baby is not moving as he or she usually does. °Date: ____________ Movements: ____________ Start time: ____________ Finish time: ____________  °Date: ____________ Movements: ____________ Start time: ____________ Finish time: ____________ °Date: ____________ Movements: ____________ Start time: ____________ Finish time: ____________ °Date: ____________ Movements: ____________ Start time: ____________ Finish time: ____________ °Date: ____________ Movements: ____________ Start time: ____________ Finish time: ____________ °Date: ____________ Movements: ____________ Start time: ____________ Finish time: ____________ °Date: ____________ Movements: ____________ Start time: ____________ Finish time: ____________ °Date: ____________ Movements: ____________ Start time: ____________ Finish time: ____________  °Date: ____________ Movements: ____________ Start time:  ____________ Finish time: ____________ °Date: ____________ Movements: ____________ Start time: ____________ Finish time: ____________ °Date: ____________ Movements: ____________ Start time: ____________ Finish time: ____________ °Date: ____________ Movements: ____________ Start time: ____________ Finish time: ____________ °Date: ____________ Movements: ____________ Start time: ____________ Finish time: ____________ °Date: ____________ Movements: ____________ Start time: ____________ Finish time: ____________ °Date: ____________ Movements: ____________ Start time: ____________ Finish time: ____________  °Date: ____________ Movements: ____________ Start time: ____________ Finish time: ____________ °Date: ____________ Movements: ____________ Start time: ____________ Finish time: ____________ °Date: ____________ Movements: ____________ Start time: ____________ Finish time: ____________ °Date: ____________ Movements: ____________ Start time: ____________ Finish time: ____________ °Date: ____________ Movements: ____________ Start time: ____________ Finish time: ____________ °Date: ____________ Movements: ____________ Start time: ____________ Finish time: ____________ °Date: ____________ Movements: ____________ Start time: ____________ Finish time: ____________  °  Date: ____________ Movements: ____________ Start time: ____________ Finish time: ____________ °Date: ____________ Movements: ____________ Start time: ____________ Finish time: ____________ °Date: ____________ Movements: ____________ Start time: ____________ Finish time: ____________ °Date: ____________ Movements: ____________ Start time: ____________ Finish time: ____________ °Date: ____________ Movements: ____________ Start time: ____________ Finish time: ____________ °Date: ____________ Movements: ____________ Start time: ____________ Finish time: ____________ °Date: ____________ Movements: ____________ Start time: ____________ Finish time: ____________  °Date:  ____________ Movements: ____________ Start time: ____________ Finish time: ____________ °Date: ____________ Movements: ____________ Start time: ____________ Finish time: ____________ °Date: ____________ Movements: ____________ Start time: ____________ Finish time: ____________ °Date: ____________ Movements: ____________ Start time: ____________ Finish time: ____________ °Date: ____________ Movements: ____________ Start time: ____________ Finish time: ____________ °Date: ____________ Movements: ____________ Start time: ____________ Finish time: ____________ °Date: ____________ Movements: ____________ Start time: ____________ Finish time: ____________  °Date: ____________ Movements: ____________ Start time: ____________ Finish time: ____________ °Date: ____________ Movements: ____________ Start time: ____________ Finish time: ____________ °Date: ____________ Movements: ____________ Start time: ____________ Finish time: ____________ °Date: ____________ Movements: ____________ Start time: ____________ Finish time: ____________ °Date: ____________ Movements: ____________ Start time: ____________ Finish time: ____________ °Date: ____________ Movements: ____________ Start time: ____________ Finish time: ____________ °Date: ____________ Movements: ____________ Start time: ____________ Finish time: ____________  °Date: ____________ Movements: ____________ Start time: ____________ Finish time: ____________ °Date: ____________ Movements: ____________ Start time: ____________ Finish time: ____________ °Date: ____________ Movements: ____________ Start time: ____________ Finish time: ____________ °Date: ____________ Movements: ____________ Start time: ____________ Finish time: ____________ °Date: ____________ Movements: ____________ Start time: ____________ Finish time: ____________ °Date: ____________ Movements: ____________ Start time: ____________ Finish time: ____________ °Date: ____________ Movements: ____________ Start  time: ____________ Finish time: ____________  °Date: ____________ Movements: ____________ Start time: ____________ Finish time: ____________ °Date: ____________ Movements: ____________ Start time: ____________ Finish time: ____________ °Date: ____________ Movements: ____________ Start time: ____________ Finish time: ____________ °Date: ____________ Movements: ____________ Start time: ____________ Finish time: ____________ °Date: ____________ Movements: ____________ Start time: ____________ Finish time: ____________ °Date: ____________ Movements: ____________ Start time: ____________ Finish time: ____________ °Document Released: 06/24/2006 Document Revised: 05/11/2012 Document Reviewed: 03/21/2012 °[employer]® Patient Information ©2015 [employer]. This information is not intended to replace advice given to you by your health care provider. Make sure you discuss any questions you have with your health care provider. ° °

## 2013-12-07 ENCOUNTER — Inpatient Hospital Stay (HOSPITAL_COMMUNITY)
Admission: AD | Admit: 2013-12-07 | Discharge: 2013-12-10 | DRG: 775 | Disposition: A | Discharge status: Home / Self Care | Payer: BC Managed Care – PPO | Source: Ambulatory Visit | Attending: Obstetrics and Gynecology | Admitting: Obstetrics and Gynecology

## 2013-12-07 ENCOUNTER — Inpatient Hospital Stay (HOSPITAL_COMMUNITY): Payer: BC Managed Care – PPO | Admitting: Anesthesiology

## 2013-12-07 ENCOUNTER — Encounter: Payer: Self-pay | Admitting: Family Medicine

## 2013-12-07 ENCOUNTER — Ambulatory Visit (INDEPENDENT_AMBULATORY_CARE_PROVIDER_SITE_OTHER): Payer: BC Managed Care – PPO | Admitting: Family Medicine

## 2013-12-07 ENCOUNTER — Encounter (HOSPITAL_COMMUNITY): Payer: Self-pay | Admitting: *Deleted

## 2013-12-07 ENCOUNTER — Encounter (HOSPITAL_COMMUNITY): Payer: BC Managed Care – PPO | Admitting: Anesthesiology

## 2013-12-07 VITALS — BP 119/69 | HR 88 | Temp 97.3°F | Wt 139.0 lb

## 2013-12-07 DIAGNOSIS — Z3490 Encounter for supervision of normal pregnancy, unspecified, unspecified trimester: Secondary | ICD-10-CM

## 2013-12-07 DIAGNOSIS — J45909 Unspecified asthma, uncomplicated: Secondary | ICD-10-CM | POA: Diagnosis present

## 2013-12-07 DIAGNOSIS — O280 Abnormal hematological finding on antenatal screening of mother: Secondary | ICD-10-CM

## 2013-12-07 DIAGNOSIS — Z3403 Encounter for supervision of normal first pregnancy, third trimester: Secondary | ICD-10-CM

## 2013-12-07 DIAGNOSIS — IMO0001 Reserved for inherently not codable concepts without codable children: Secondary | ICD-10-CM

## 2013-12-07 DIAGNOSIS — Z87891 Personal history of nicotine dependence: Secondary | ICD-10-CM

## 2013-12-07 DIAGNOSIS — Z3400 Encounter for supervision of normal first pregnancy, unspecified trimester: Secondary | ICD-10-CM

## 2013-12-07 DIAGNOSIS — O99519 Diseases of the respiratory system complicating pregnancy, unspecified trimester: Secondary | ICD-10-CM

## 2013-12-07 LAB — POCT URINALYSIS DIP (DEVICE)
BILIRUBIN URINE: NEGATIVE
Glucose, UA: 100 mg/dL — AB
HGB URINE DIPSTICK: NEGATIVE
Ketones, ur: NEGATIVE mg/dL
Nitrite: NEGATIVE
Protein, ur: NEGATIVE mg/dL
Specific Gravity, Urine: <=1.005 (ref 1.005–1.030)
Urobilinogen, UA: 0.2 mg/dL (ref 0.0–1.0)
pH: 6.5 (ref 5.0–8.0)

## 2013-12-07 LAB — CBC
HCT: 39.1 % (ref 36.0–46.0)
HEMOGLOBIN: 13.5 g/dL (ref 12.0–15.0)
MCH: 31 pg (ref 26.0–34.0)
MCHC: 34.5 g/dL (ref 30.0–36.0)
MCV: 89.9 fL (ref 78.0–100.0)
PLATELETS: 184 10*3/uL (ref 150–400)
RBC: 4.35 MIL/uL (ref 3.87–5.11)
RDW: 12.9 % (ref 11.5–15.5)
WBC: 13.8 10*3/uL — ABNORMAL HIGH (ref 4.0–10.5)

## 2013-12-07 MED ORDER — PHENYLEPHRINE 40 MCG/ML (10ML) SYRINGE FOR IV PUSH (FOR BLOOD PRESSURE SUPPORT)
80.0000 ug | PREFILLED_SYRINGE | INTRAVENOUS | Status: DC | PRN
  Filled 2013-12-07: qty 2

## 2013-12-07 MED ORDER — ONDANSETRON HCL 4 MG/2ML IJ SOLN: 4.0000 mg | Freq: Four times a day (QID) | INTRAMUSCULAR | Status: DC | PRN

## 2013-12-07 MED ORDER — OXYTOCIN 40 UNITS IN LACTATED RINGERS INFUSION - SIMPLE MED: 62.5000 mL/h | INTRAVENOUS | Status: DC

## 2013-12-07 MED ORDER — LACTATED RINGERS IV SOLN
500.0000 mL | INTRAVENOUS | Status: DC | PRN
Start: 2013-12-07 — End: 2013-12-09
  Administered 2013-12-08: 500 mL via INTRAVENOUS

## 2013-12-07 MED ORDER — EPHEDRINE 5 MG/ML INJ
10.0000 mg | INTRAVENOUS | Status: DC | PRN
  Filled 2013-12-07: qty 2

## 2013-12-07 MED ORDER — LIDOCAINE HCL (PF) 1 % IJ SOLN
INTRAMUSCULAR | Status: DC | PRN
  Administered 2013-12-07: 10 mL

## 2013-12-07 MED ORDER — IBUPROFEN 600 MG PO TABS
600.0000 mg | ORAL_TABLET | Freq: Four times a day (QID) | ORAL | Status: DC | PRN
  Administered 2013-12-08: 600 mg via ORAL
  Filled 2013-12-07: qty 1

## 2013-12-07 MED ORDER — FENTANYL 2.5 MCG/ML BUPIVACAINE 1/10 % EPIDURAL INFUSION (WH - ANES)
14.0000 mL/h | INTRAMUSCULAR | Status: DC | PRN
  Administered 2013-12-07 – 2013-12-08 (×2): 14 mL/h via EPIDURAL
  Filled 2013-12-07 (×3): qty 125

## 2013-12-07 MED ORDER — FENTANYL CITRATE 0.05 MG/ML IJ SOLN
100.0000 ug | INTRAMUSCULAR | Status: DC | PRN
  Administered 2013-12-07: 100 ug via INTRAVENOUS
  Filled 2013-12-07: qty 2

## 2013-12-07 MED ORDER — CITRIC ACID-SODIUM CITRATE 334-500 MG/5ML PO SOLN: 30.0000 mL | ORAL | Status: DC | PRN

## 2013-12-07 MED ORDER — OXYTOCIN BOLUS FROM INFUSION: 500.0000 mL | INTRAVENOUS | Status: DC

## 2013-12-07 MED ORDER — PHENYLEPHRINE 40 MCG/ML (10ML) SYRINGE FOR IV PUSH (FOR BLOOD PRESSURE SUPPORT)
80.0000 ug | PREFILLED_SYRINGE | INTRAVENOUS | Status: DC | PRN
  Filled 2013-12-07: qty 2
  Filled 2013-12-07: qty 10

## 2013-12-07 MED ORDER — LACTATED RINGERS IV SOLN
INTRAVENOUS | Status: DC
  Administered 2013-12-07 – 2013-12-08 (×4): via INTRAVENOUS

## 2013-12-07 MED ORDER — TERBUTALINE SULFATE 1 MG/ML IJ SOLN: 0.2500 mg | Freq: Once | INTRAMUSCULAR | Status: AC | PRN

## 2013-12-07 MED ORDER — LACTATED RINGERS IV SOLN
500.0000 mL | Freq: Once | INTRAVENOUS | Status: AC
  Administered 2013-12-07: 500 mL via INTRAVENOUS

## 2013-12-07 MED ORDER — FENTANYL 2.5 MCG/ML BUPIVACAINE 1/10 % EPIDURAL INFUSION (WH - ANES): 14.0000 mL/h | INTRAMUSCULAR | Status: DC | PRN

## 2013-12-07 MED ORDER — OXYCODONE-ACETAMINOPHEN 5-325 MG PO TABS: 1.0000 | ORAL_TABLET | ORAL | Status: DC | PRN

## 2013-12-07 MED ORDER — ACETAMINOPHEN 325 MG PO TABS: 650.0000 mg | ORAL_TABLET | ORAL | Status: DC | PRN

## 2013-12-07 MED ORDER — OXYTOCIN 40 UNITS IN LACTATED RINGERS INFUSION - SIMPLE MED
1.0000 m[IU]/min | INTRAVENOUS | Status: DC
  Administered 2013-12-08: 2 m[IU]/min via INTRAVENOUS
  Filled 2013-12-07: qty 1000

## 2013-12-07 MED ORDER — FLEET ENEMA 7-19 GM/118ML RE ENEM: 1.0000 | ENEMA | RECTAL | Status: DC | PRN

## 2013-12-07 MED ORDER — LIDOCAINE HCL (PF) 1 % IJ SOLN
30.0000 mL | INTRAMUSCULAR | Status: DC | PRN
  Filled 2013-12-07: qty 30

## 2013-12-07 MED ORDER — DIPHENHYDRAMINE HCL 50 MG/ML IJ SOLN
12.5000 mg | INTRAMUSCULAR | Status: DC | PRN
  Administered 2013-12-08 (×2): 12.5 mg via INTRAVENOUS
  Filled 2013-12-07 (×2): qty 1

## 2013-12-07 NOTE — Progress Notes (Signed)
MD informed of SVE, uc's, variable decels, states he & Dr. Reola CalkinsBeck will call back.

## 2013-12-07 NOTE — H&P (Signed)
LABOR ADMISSION HISTORY AND PHYSICAL  Melanie Galloway is a 23 y.o. female G1P0 with IUP at 7152w6d by 12 wk US presenting for contractions.  Have been going on for the last 24 hours and worsened over the last 1-2 hours.  No lof, vb.  +FM.    [facility] 8 wks. Has been uncomplicated.  Abnormal PAPPA but normal panorama and normal growth at 31 weeks. Did not get a repeat growth scan later. GBS neg.    Prenatal History/Complications:  Past Medical History: Past Medical History  Diagnosis Date  . Stomach ulcer   . Migraines   . Asthma     Past Surgical History: Past Surgical History  Procedure Laterality Date  . Eye muscle surgery    . External ear surgery      Obstetrical History: OB History   Grav Para Term Preterm Abortions TAB SAB Ect Mult Living   1         0      Social History: History   Social History  . Marital Status: Single    Spouse Name: N/A    Number of Children: N/A  . Years of Education: N/A   Social History Main Topics  . Smoking status: Former Smoker    Types: Cigarettes    Quit date: 04/26/2013  . Smokeless tobacco: Never Used  . Alcohol Use: No  . Drug Use: No     Comment: quit with pregnancy  . Sexual Activity: Yes    Birth Control/ Protection: None   Other Topics Concern  . None   Social History Narrative  . None    Family History: Family History  Problem Relation Age of Onset  . Cancer Mother   . Asthma Sister     Allergies: No Known Allergies  Facility-administered medications prior to admission  Medication Dose Route Frequency Provider Last Rate Last Dose  . Tdap (BOOSTRIX) injection 0.5 mL  0.5 mL Intramuscular Once Danae Orleanseirdre C Poe, CNM       Prescriptions prior to admission  Medication Sig Dispense Refill  . calcium carbonate (TUMS - DOSED IN MG ELEMENTAL CALCIUM) 500 MG chewable tablet Chew 2 tablets by mouth daily as needed for indigestion or heartburn.      . Prenatal Vit-Fe Fumarate-FA (PRENATAL MULTIVITAMIN)  TABS tablet Take 1 tablet by mouth daily at 12 noon.  30 tablet  6  . albuterol (PROVENTIL HFA;VENTOLIN HFA) 108 (90 BASE) MCG/ACT inhaler Inhale 1-2 puffs into the lungs every 6 (six) hours as needed for wheezing or shortness of breath.  1 Inhaler  1     Review of Systems   All systems reviewed and negative except as stated in HPI  Blood pressure 139/76, pulse 88, temperature 98.5 F (36.9 C), temperature source Oral, resp. rate 16, height 5\' 4"  (1.626 m), weight 63.594 kg (140 lb 3.2 oz), last menstrual period 02/25/2013, SpO2 98.00%. General appearance: alert, cooperative and mild distress Lungs: clear to auscultation bilaterally Heart: regular rate and rhythm Abdomen: soft, non-tender; bowel sounds normal Extremities: Homans sign is negative, no sign of DVT DTR's 2+ Presentation: cephalic Fetal monitoringBaseline: 140 bpm, Variability: Good {> 6 bpm), Accelerations: Reactive and Decelerations: Variable: mild Uterine activity contractions every 2-733min   Dilation: 3 Effacement (%): 90 Station: -1 Exam by:: Lacrystal Barbe MD   Prenatal labs: ABO, Rh: AB/POS/-- (12/02 0913) Antibody: NEG (12/02 0913) Rubella:   RPR: NON REAC (04/07 1439)  HBsAg: NEGATIVE (12/02 0913)  HIV: NON-REACTIVE (04/07 1439)  GBS:    1 hr Glucola 100 Genetic screening  Abnormal PAPPA Anatomy US normal    Prenatal Transfer Tool  Maternal Diabetes: No Genetic Screening: Abnormal:  Results: Other:PAPPA Maternal Ultrasounds/Referrals: Normal Fetal Ultrasounds or other Referrals:  None Maternal Substance Abuse:  No Significant Maternal Medications:  none Significant Maternal Lab Results: None     Results for orders placed in visit on 12/07/13 (from the past 24 hour(s))  POCT URINALYSIS DIP (DEVICE)   Collection Time    12/07/13 11:42 AM      Result Value Ref Range   Glucose, UA 100 (*) NEGATIVE mg/dL   Bilirubin Urine NEGATIVE  NEGATIVE   Ketones, ur NEGATIVE  NEGATIVE mg/dL   Specific Gravity,  Urine <=1.005  1.005 - 1.030   Hgb urine dipstick NEGATIVE  NEGATIVE   pH 6.5  5.0 - 8.0   Protein, ur NEGATIVE  NEGATIVE mg/dL   Urobilinogen, UA 0.2  0.0 - 1.0 mg/dL   Nitrite NEGATIVE  NEGATIVE   Leukocytes, UA SMALL (*) NEGATIVE    Assessment: Melanie Galloway is a 23 y.o. G1P0 at 3779w6d here for contractions.    #Labor:has changed her cervix from 2-3 and baby with variable decels on tracing.  #Pain: Epidural prn #FWB: Cat 2 tracing. EFW 6.5lbs #ID:  GBS neg #MOF: breast  #MOC: mirena #Circ:  Desires circ  Melanie Galloway 12/07/2013, 8:01 PM

## 2013-12-07 NOTE — H&P (Signed)
Attestation of Attending Supervision of Obstetric Fellow: Evaluation and management procedures were performed by the Obstetric Fellow under my supervision and collaboration.  I have reviewed the Obstetric Fellow's note and chart, and I agree with the management and plan.  Yerlin Gasparyan, MD, FACOG Attending Obstetrician & Gynecologist Faculty Practice, [facility]   

## 2013-12-07 NOTE — Anesthesia Preprocedure Evaluation (Signed)
Anesthesia Evaluation  Patient identified by MRN, date of birth, ID band Patient awake    Reviewed: Allergy & Precautions, H&P , Patient's Chart, lab work & pertinent test results  Airway Mallampati: II TM Distance: >3 FB Neck ROM: full    Dental  (+) Teeth Intact   Pulmonary asthma (little inhaler use for > one month) , former smoker,  breath sounds clear to auscultation        Cardiovascular Rhythm:regular Rate:Normal     Neuro/Psych    GI/Hepatic   Endo/Other    Renal/GU      Musculoskeletal   Abdominal   Peds  Hematology   Anesthesia Other Findings       Reproductive/Obstetrics (+) Pregnancy                           Anesthesia Physical Anesthesia Plan  ASA: II  Anesthesia Plan: Epidural   Post-op Pain Management:    Induction:   Airway Management Planned:   Additional Equipment:   Intra-op Plan:   Post-operative Plan:   Informed Consent: I have reviewed the patients History and Physical, chart, labs and discussed the procedure including the risks, benefits and alternatives for the proposed anesthesia with the patient or authorized representative who has indicated his/her understanding and acceptance.   Dental Advisory Given  Plan Discussed with:   Anesthesia Plan Comments: (Labs checked- platelets confirmed with RN in room. Fetal heart tracing, per RN, reported to be stable enough for sitting procedure. Discussed epidural, and patient consents to the procedure:  included risk of possible headache,backache, failed block, allergic reaction, and nerve injury. This patient was asked if she had any questions or concerns before the procedure started.)        Anesthesia Quick Evaluation

## 2013-12-07 NOTE — Patient Instructions (Signed)
Third Trimester of Pregnancy The third trimester is from week 29 through week 42, months 7 through 9. The third trimester is a time when the fetus is growing rapidly. At the end of the ninth month, the fetus is about 20 inches in length and weighs 6-10 pounds.  BODY CHANGES Your body goes through many changes during pregnancy. The changes vary from woman to woman.   Your weight will continue to increase. You can expect to gain 25-35 pounds (11-16 kg) by the end of the pregnancy.  You may begin to get stretch marks on your hips, abdomen, and breasts.  You may urinate more often because the fetus is moving lower into your pelvis and pressing on your bladder.  You may develop or continue to have heartburn as a result of your pregnancy.  You may develop constipation because certain hormones are causing the muscles that push waste through your intestines to slow down.  You may develop hemorrhoids or swollen, bulging veins (varicose veins).  You may have pelvic pain because of the weight gain and pregnancy hormones relaxing your joints between the bones in your pelvis. Backaches may result from overexertion of the muscles supporting your posture.  You may have changes in your hair. These can include thickening of your hair, rapid growth, and changes in texture. Some women also have hair loss during or after pregnancy, or hair that feels dry or thin. Your hair will most likely return to normal after your baby is born.  Your breasts will continue to grow and be tender. A yellow discharge may leak from your breasts called colostrum.  Your belly button may stick out.  You may feel short of breath because of your expanding uterus.  You may notice the fetus "dropping," or moving lower in your abdomen.  You may have a bloody mucus discharge. This usually occurs a few days to a week before labor begins.  Your cervix becomes thin and soft (effaced) near your due date. WHAT TO EXPECT AT YOUR PRENATAL  EXAMS  You will have prenatal exams every 2 weeks until week 36. Then, you will have weekly prenatal exams. During a routine prenatal visit:  You will be weighed to make sure you and the fetus are growing normally.  Your blood pressure is taken.  Your abdomen will be measured to track your baby's growth.  The fetal heartbeat will be listened to.  Any test results from the previous visit will be discussed.  You may have a cervical check near your due date to see if you have effaced. At around 36 weeks, your caregiver will check your cervix. At the same time, your caregiver will also perform a test on the secretions of the vaginal tissue. This test is to determine if a type of bacteria, Group B streptococcus, is present. Your caregiver will explain this further. Your caregiver may ask you:  What your birth plan is.  How you are feeling.  If you are feeling the baby move.  If you have had any abnormal symptoms, such as leaking fluid, bleeding, severe headaches, or abdominal cramping.  If you have any questions. Other tests or screenings that may be performed during your third trimester include:  Blood tests that check for low iron levels (anemia).  Fetal testing to check the health, activity level, and growth of the fetus. Testing is done if you have certain medical conditions or if there are problems during the pregnancy. FALSE LABOR You may feel small, irregular contractions that   eventually go away. These are called Braxton Hicks contractions, or false labor. Contractions may last for hours, days, or even weeks before true labor sets in. If contractions come at regular intervals, intensify, or become painful, it is best to be seen by your caregiver.  SIGNS OF LABOR   Menstrual-like cramps.  Contractions that are 5 minutes apart or less.  Contractions that start on the top of the uterus and spread down to the lower abdomen and back.  A sense of increased pelvic pressure or back  pain.  A watery or bloody mucus discharge that comes from the vagina. If you have any of these signs before the 37th week of pregnancy, call your caregiver right away. You need to go to the hospital to get checked immediately. HOME CARE INSTRUCTIONS   Avoid all smoking, herbs, alcohol, and unprescribed drugs. These chemicals affect the formation and growth of the baby.  Follow your caregiver's instructions regarding medicine use. There are medicines that are either safe or unsafe to take during pregnancy.  Exercise only as directed by your caregiver. Experiencing uterine cramps is a good sign to stop exercising.  Continue to eat regular, healthy meals.  Wear a good support bra for breast tenderness.  Do not use hot tubs, steam rooms, or saunas.  Wear your seat belt at all times when driving.  Avoid raw meat, uncooked cheese, cat litter boxes, and soil used by cats. These carry germs that can cause birth defects in the baby.  Take your prenatal vitamins.  Try taking a stool softener (if your caregiver approves) if you develop constipation. Eat more high-fiber foods, such as fresh vegetables or fruit and whole grains. Drink plenty of fluids to keep your urine clear or pale yellow.  Take warm sitz baths to soothe any pain or discomfort caused by hemorrhoids. Use hemorrhoid cream if your caregiver approves.  If you develop varicose veins, wear support hose. Elevate your feet for 15 minutes, 3-4 times a day. Limit salt in your diet.  Avoid heavy lifting, wear low heal shoes, and practice good posture.  Rest a lot with your legs elevated if you have leg cramps or low back pain.  Visit your dentist if you have not gone during your pregnancy. Use a soft toothbrush to brush your teeth and be gentle when you floss.  A sexual relationship may be continued unless your caregiver directs you otherwise.  Do not travel far distances unless it is absolutely necessary and only with the approval  of your caregiver.  Take prenatal classes to understand, practice, and ask questions about the labor and delivery.  Make a trial run to the hospital.  Pack your hospital bag.  Prepare the baby's nursery.  Continue to go to all your prenatal visits as directed by your caregiver. SEEK MEDICAL CARE IF:  You are unsure if you are in labor or if your water has broken.  You have dizziness.  You have mild pelvic cramps, pelvic pressure, or nagging pain in your abdominal area.  You have persistent nausea, vomiting, or diarrhea.  You have a bad smelling vaginal discharge.  You have pain with urination. SEEK IMMEDIATE MEDICAL CARE IF:   You have a fever.  You are leaking fluid from your vagina.  You have spotting or bleeding from your vagina.  You have severe abdominal cramping or pain.  You have rapid weight loss or gain.  You have shortness of breath with chest pain.  You notice sudden or extreme swelling   of your face, hands, ankles, feet, or legs.  You have not felt your baby move in over an hour.  You have severe headaches that do not go away with medicine.  You have vision changes. Document Released: 05/19/2001 Document Revised: 05/30/2013 Document Reviewed: 07/26/2012 ExitCare Patient Information 2015 [employer]. This information is not intended to replace advice given to you by your health care provider. Make sure you discuss any questions you have with your health care provider.  

## 2013-12-07 NOTE — Progress Notes (Signed)
+  FM, no lof, no vb, occ ctx  No complaints GBS Neg - desires stripping membranes  Melanie Galloway is a 23 y.o. G1P0 at 2430w6d by R=12 here for ROB visit.    Discussed with Patient:  - Plans to breast feed.  All questions answered. - Continue prenatal vitamins. - Reviewed fetal kick counts Pt to perform daily at a time when the baby is active, lie laterally with both hands on belly in quiet room and count all movements (hiccups, shoulder rolls, obvious kicks, etc); pt is to report to clinic MAU for less than 10 movements felt in a 2 hour time period-pt told as soon as she counts 10 movements the count is complete.  - Routine precautions discussed (depression, infection s/s).   Patient provided with all pertinent phone numbers for emergencies. - RTC for any VB, regular, painful cramps/ctxs occurring at a rate of >2/10 min, fever (100.5 or higher), n/v/d, any pain that is unresolving or worsening, LOF, decreased fetal movement, CP, SOB, edema -RTC in one week for next visit.  Problems: Patient Active Problem List   Diagnosis Date Noted  . Chronic migraine 09/05/2013  . Anxiety 09/05/2013  . Depression 09/05/2013  . Chronic headache 07/04/2013  . Abnormal maternal serum screening test 06/13/2013  . UTI (urinary tract infection) in pregnancy in first trimester 06/06/2013  . Supervision of normal first pregnancy 05/09/2013  . Asthma complicating pregnancy, antepartum 05/09/2013    To Do: 1.   [ ]  Vaccines:recd [ x] BCM: mirena [ x] Readiness: baby has a place to sleep, car seat, other baby necessities.  Edu: [x ] PTL precautions; [ ]  BF class; [ ]  childbirth class; [ ]   BF counseling;

## 2013-12-07 NOTE — MAU Note (Signed)
Patient states she has been having contractions every 5 minutes. Denies bleeding or leaking today and reports good fetal movement.

## 2013-12-07 NOTE — Anesthesia Procedure Notes (Signed)

## 2013-12-08 ENCOUNTER — Encounter (HOSPITAL_COMMUNITY): Payer: Self-pay | Admitting: *Deleted

## 2013-12-08 DIAGNOSIS — J45909 Unspecified asthma, uncomplicated: Secondary | ICD-10-CM

## 2013-12-08 DIAGNOSIS — Z87891 Personal history of nicotine dependence: Secondary | ICD-10-CM

## 2013-12-08 LAB — RPR

## 2013-12-08 MED ORDER — PRENATAL MULTIVITAMIN CH
1.0000 | ORAL_TABLET | Freq: Every day | ORAL | Status: DC
  Administered 2013-12-09 – 2013-12-10 (×2): 1 via ORAL
  Filled 2013-12-08 (×2): qty 1

## 2013-12-08 MED ORDER — ONDANSETRON HCL 4 MG PO TABS: 4.0000 mg | ORAL_TABLET | ORAL | Status: DC | PRN

## 2013-12-08 MED ORDER — ONDANSETRON HCL 4 MG/2ML IJ SOLN: 4.0000 mg | INTRAMUSCULAR | Status: DC | PRN

## 2013-12-08 MED ORDER — ZOLPIDEM TARTRATE 5 MG PO TABS: 5.0000 mg | ORAL_TABLET | Freq: Every evening | ORAL | Status: DC | PRN

## 2013-12-08 MED ORDER — LANOLIN HYDROUS EX OINT: TOPICAL_OINTMENT | CUTANEOUS | Status: DC | PRN

## 2013-12-08 MED ORDER — WITCH HAZEL-GLYCERIN EX PADS: 1.0000 "application " | MEDICATED_PAD | CUTANEOUS | Status: DC | PRN

## 2013-12-08 MED ORDER — SENNOSIDES-DOCUSATE SODIUM 8.6-50 MG PO TABS
2.0000 | ORAL_TABLET | ORAL | Status: DC
  Administered 2013-12-09 (×2): 2 via ORAL
  Filled 2013-12-08 (×2): qty 2

## 2013-12-08 MED ORDER — DIPHENHYDRAMINE HCL 25 MG PO CAPS: 25.0000 mg | ORAL_CAPSULE | Freq: Four times a day (QID) | ORAL | Status: DC | PRN

## 2013-12-08 MED ORDER — TETANUS-DIPHTH-ACELL PERTUSSIS 5-2.5-18.5 LF-MCG/0.5 IM SUSP: 0.5000 mL | Freq: Once | INTRAMUSCULAR | Status: DC

## 2013-12-08 MED ORDER — OXYCODONE-ACETAMINOPHEN 5-325 MG PO TABS: 1.0000 | ORAL_TABLET | ORAL | Status: DC | PRN

## 2013-12-08 MED ORDER — DIBUCAINE 1 % RE OINT: 1.0000 "application " | TOPICAL_OINTMENT | RECTAL | Status: DC | PRN

## 2013-12-08 MED ORDER — BENZOCAINE-MENTHOL 20-0.5 % EX AERO: 1.0000 "application " | INHALATION_SPRAY | CUTANEOUS | Status: DC | PRN

## 2013-12-08 MED ORDER — SIMETHICONE 80 MG PO CHEW
80.0000 mg | CHEWABLE_TABLET | ORAL | Status: DC | PRN
Start: 2013-12-08 — End: 2013-12-10

## 2013-12-08 MED ORDER — IBUPROFEN 600 MG PO TABS
600.0000 mg | ORAL_TABLET | Freq: Four times a day (QID) | ORAL | Status: DC
  Administered 2013-12-09 – 2013-12-10 (×7): 600 mg via ORAL
  Filled 2013-12-08 (×8): qty 1

## 2013-12-08 NOTE — Progress Notes (Addendum)
Melanie Galloway is a 23 y.o. G1P0 at 234w0d by ultrasound admitted for active labor  Subjective: Feeling comfortable with epidural. Holding Pit for now.  Pt. Progressing as expected.  No current complaints at this time, resting quietly, in good spirits  Objective: BP 127/76  Pulse 83  Temp(Src) 98.1 F (36.7 C) (Oral)  Resp 18  Ht 5\' 4"  (1.626 m)  Wt 63.594 kg (140 lb 3.2 oz)  BMI 24.05 kg/m2  SpO2 98%  LMP 02/25/2013 I/O last 3 completed shifts: In: -  Out: 1500 [Urine:1500]    FHT:  FHR: 130 bpm, variability: moderate,  accelerations:  Present,  decelerations: None at this time.  UC:   regular, every 2-3 minutes SVE:   Dilation: Lip/rim Effacement (%): 100 Station: +2 Exam by:: hk  Labs: Lab Results  Component Value Date   WBC 13.8* 12/07/2013   HGB 13.5 12/07/2013   HCT 39.1 12/07/2013   MCV 89.9 12/07/2013   PLT 184 12/07/2013    Assessment / Plan: Spontaneous labor, progressing normally  Labor: Progressing normally attempted AROM previously, currently rim, will recheck in 2hrs. Or with pressure.  Preeclampsia:  no signs or symptoms of toxicity Fetal Wellbeing:  Cat I Pain Control:  Epidural I/D:  n/a Anticipated MOD:  NSVD  Melancon, Caleb G 12/08/2013, 11:58 AM  Addendum for Cosign by Fellow - Dr. Ike Benedom  I spoke with and examined patient and agree with resident's note and plan of care.  Tawana ScaleMichael Ryan Emilio Baylock, MD OB Fellow 12/09/2013 5:16 AM

## 2013-12-08 NOTE — Progress Notes (Signed)
Melanie Galloway is a 23 y.o. G1P0 at 4534w0d by ultrasound admitted for active labor  Subjective: Feeling comfortable with epidural. Pitocin held for recurrent variable decels. Improved at this time No current complaints at this time, resting quietly, in good spirits  Objective: BP 143/75  Pulse 78  Temp(Src) 97.8 F (36.6 C) (Oral)  Resp 18  Ht 5\' 4"  (1.626 m)  Wt 63.594 kg (140 lb 3.2 oz)  BMI 24.05 kg/m2  SpO2 98%  LMP 02/25/2013 I/O last 3 completed shifts: In: -  Out: 1500 [Urine:1500]    FHT:  FHR: 130 bpm, variability: moderate,  accelerations:  Present,  decelerations:  Present variables occassional UC:   regular, every 2-3 minutes SVE:   Dilation: 8 Effacement (%): 100 Station: +2 Exam by:: melancon/Dinorah Masullo  Labs: Lab Results  Component Value Date   WBC 13.8* 12/07/2013   HGB 13.5 12/07/2013   HCT 39.1 12/07/2013   MCV 89.9 12/07/2013   PLT 184 12/07/2013    Assessment / Plan: Spontaneous labor, progressing normally  Labor: Progressing normally attempted AROM, no BBOW at this visit, Did not feel hair but some bloody discharge Preeclampsia:  no signs or symptoms of toxicity Fetal Wellbeing:  Category II Pain Control:  Epidural I/D:  n/a Anticipated MOD:  NSVD  Melanie Galloway 12/08/2013, 9:58 AM

## 2013-12-08 NOTE — Progress Notes (Signed)
Patient ID: Melanie Galloway, female   DOB: December 30, 1990, 23 y.o.   MRN: [id number] Doing well.  Filed Vitals:   12/08/13 0105 12/08/13 0115 12/08/13 0130 12/08/13 0200  BP: 123/56 107/48 101/47 92/44  Pulse: 77 74 71 72  Temp:      TempSrc:      Resp:  16  16  Height:      Weight:      SpO2:       FHR reactive UCs every 3-4 min  Dilation: 3.5 Effacement (%): 90 Cervical Position: Anterior Station: -1 Presentation: Vertex Exam by:: E.Clapper, RN  Cervix not changing.  Labor still latent phase Will start Pitocin augmentation

## 2013-12-08 NOTE — Anesthesia Postprocedure Evaluation (Signed)
  Anesthesia Post-op Note  Patient: Melanie Galloway  Procedure(s) Performed: * No procedures listed *  Patient Location: Mother/Baby  Anesthesia Type:Epidural  Level of Consciousness: awake, alert  and oriented  Airway and Oxygen Therapy: Patient Spontanous Breathing  Post-op Pain: none  Post-op Assessment: Post-op Vital signs reviewed and Patient's Cardiovascular Status Stable  Post-op Vital Signs: Reviewed and stable  Last Vitals:  Filed Vitals:   12/08/13 1700  BP: 132/78  Pulse: 91  Temp: 37.4 C  Resp:     Complications: No apparent anesthesia complications

## 2013-12-08 NOTE — Progress Notes (Signed)
Melanie Galloway is a 23 y.o. G1P0 at 201w0d by ultrasound admitted for active labor  Subjective: Feeling comfortable with epidural. Continue to hold pitocin. Pt. Is now complete. She is progressing normally.  No current complaints at this time, resting quietly, in good spirits  Objective: BP 132/76  Pulse 83  Temp(Src) 97.8 F (36.6 C) (Oral)  Resp 18  Ht 5\' 4"  (1.626 m)  Wt 63.594 kg (140 lb 3.2 oz)  BMI 24.05 kg/m2  SpO2 98%  LMP 02/25/2013 I/O last 3 completed shifts: In: -  Out: 1500 [Urine:1500]    FHT:  FHR: 140 bpm, variability: moderate,  accelerations:  Present,  decelerations: None at this time. Cat I UC:   regular, every 2-3 minutes SVE:   Dilation: 10 Effacement (%): 100 Station: +2;+3 Exam by:: melancon/hk  Labs: Lab Results  Component Value Date   WBC 13.8* 12/07/2013   HGB 13.5 12/07/2013   HCT 39.1 12/07/2013   MCV 89.9 12/07/2013   PLT 184 12/07/2013    Assessment / Plan: Spontaneous labor, progressing normally  Labor: Progressing normally attempted AROM previously, currently complete. Plan to progress with labor as planned. Will augment with pitocin if needed.   Preeclampsia:  no signs or symptoms of toxicity Fetal Wellbeing:  Cat I Pain Control:  Epidural I/D:  n/a Anticipated MOD:  NSVD  Melancon, Caleb G 12/08/2013, 1:12 PM  Addendum for Cosign by Fellow - Dr. Ike Benedom  I spoke with and examined patient and agree with resident's note and plan of care.  Tawana ScaleMichael Ryan Veroncia Jezek, MD OB Fellow 12/09/2013 5:16 AM

## 2013-12-08 NOTE — Progress Notes (Signed)
Melanie Galloway is a [age] y.o. G1P0 at 3471w0d by ultrasound admitted for active labor  Subjective: Feeling comfortable with epidural No current complaints at this time, resting quietly, in good spirits  Objective: BP 104/52  Pulse 71  Temp(Src) 98.5 F (36.9 C) (Oral)  Resp 18  Ht 5\' 4"  (1.626 m)  Wt 63.594 kg (140 lb 3.2 oz)  BMI 24.05 kg/m2  SpO2 98%  LMP 02/25/2013   Total I/O In: -  Out: 1150 [Urine:1150]  FHT:  FHR: 130 bpm, variability: moderate,  accelerations:  Present,  decelerations:  Present variables, late UC:   regular, every 2-3 minutes SVE:   Dilation: 5 Effacement (%): 90 Station: -1 Exam by:: Dr. Michail JewelsMarsh  Labs: Lab Results  Component Value Date   WBC 13.8* 12/07/2013   HGB 13.5 12/07/2013   HCT 39.1 12/07/2013   MCV 89.9 12/07/2013   PLT 184 12/07/2013    Assessment / Plan: Spontaneous labor, progressing normally  Labor: Progressing normally but slowly on pitocin, now with some bloody show Preeclampsia:  no signs or symptoms of toxicity Fetal Wellbeing:  Category II Pain Control:  Epidural I/D:  n/a Anticipated MOD:  NSVD  Anselm LisMarsh, Melanie 12/08/2013, 5:03 AM  Seen also by me Agree with note Aviva SignsMarie L Deunta Beneke, CNM

## 2013-12-09 LAB — CBC
HCT: 37.5 % (ref 36.0–46.0)
Hemoglobin: 12.9 g/dL (ref 12.0–15.0)
MCH: 31 pg (ref 26.0–34.0)
MCHC: 34.4 g/dL (ref 30.0–36.0)
MCV: 90.1 fL (ref 78.0–100.0)
PLATELETS: 155 10*3/uL (ref 150–400)
RBC: 4.16 MIL/uL (ref 3.87–5.11)
RDW: 13.1 % (ref 11.5–15.5)
WBC: 18.7 10*3/uL — ABNORMAL HIGH (ref 4.0–10.5)

## 2013-12-09 NOTE — Lactation Note (Signed)
This note was copied from the chart of Melanie Galloway. Lactation Consultation Note  Patient Name: Melanie Pryor CuriaMarissa Galloway [id number]'s Date: 12/09/2013 Reason for consult: Follow-up assessment Baby 26 hours of life. Mom about to latch baby as LC enters room. Baby cueing to feed. Mom latched baby to left breast in cross-cradle position. Baby's latch is shallow, mom's nipple is a little red. Mom reports some discomfort. Enc mom to turn baby's hips and chest toward mom's body. Baby opens mouth wide and latches deeper, suckles rhythmically with intermittent swallows. Mom reports increased comfort. Enc mom to feed with cues and at least 8-12 times/24 hours. Enc mom to call for assistance as needed.  Maternal Data Formula Feeding for Exclusion: No Infant to breast within first hour of birth: Yes Has patient been taught Hand Expression?: Yes Does the patient have breastfeeding experience prior to this delivery?: No  Feeding Feeding Type: Breast Fed Length of feed: 15 min  LATCH Score/Interventions Latch: Grasps breast easily, tongue down, lips flanged, rhythmical sucking. Intervention(s): Skin to skin Intervention(s): Adjust position  Audible Swallowing: A few with stimulation  Type of Nipple: Everted at rest and after stimulation  Comfort (Breast/Nipple): Soft / non-tender     Hold (Positioning): Assistance needed to correctly position infant at breast and maintain latch.  LATCH Score: 8  Lactation Tools Discussed/Used Initiated by:: mother   Consult Status Consult Status: Follow-up Date: 12/10/13 Follow-up type: In-patient    Geralynn OchsWILLIARD, Carola Viramontes 12/09/2013, 5:35 PM

## 2013-12-09 NOTE — Progress Notes (Signed)
Post Partum Day 1 Subjective: no complaints, up ad lib, voiding, tolerating PO, + flatus and pain decreased, controlled on motrin, minimal lochia, breastfeeding well.   Objective: Blood pressure 103/64, pulse 67, temperature 97.1 F (36.2 C), temperature source Oral, resp. rate 18, height 5\' 4"  (1.626 m), weight 63.594 kg (140 lb 3.2 oz), last menstrual period 02/25/2013, SpO2 98.00%, unknown if currently breastfeeding.  Physical Exam:  General: alert, cooperative and no distress Lochia: appropriate Uterine Fundus: firm Incision: N/A DVT Evaluation: No evidence of DVT seen on physical exam. No cords or calf tenderness.   Recent Labs  12/07/13 2025 12/09/13 0549  HGB 13.5 12.9  HCT 39.1 37.5    Assessment/Plan: Plan for discharge tomorrow, Breastfeeding, Circumcision prior to discharge and Contraception Mirena   LOS: 2 days   Melancon, Caleb G 12/09/2013, 7:27 AM   I spoke with and examined patient and agree with resident's note and plan of care.  Tawana ScaleMichael Ryan Irania Durell, MD OB Fellow 12/09/2013 8:36 AM

## 2013-12-10 MED ORDER — IBUPROFEN 600 MG PO TABS: 600.0000 mg | ORAL_TABLET | Freq: Four times a day (QID) | ORAL | Status: DC | PRN

## 2013-12-10 NOTE — Discharge Summary (Signed)
Obstetric Discharge Summary Reason for Admission: onset of labor Prenatal Procedures: ultrasound Intrapartum Procedures: spontaneous vaginal delivery Postpartum Procedures: none Complications-Operative and Postpartum: none Eating, drinking, voiding, ambulating well.  +flatus.  Lochia and pain wnl.  Denies dizziness, lightheadedness, or sob. No complaints.   Hospital Course: Melanie Galloway is a 23 y.o. [id number] female admited at 38.6wks. She progressed to birth w/o complication. Her pp course has been uncomplicated.  By PPD#2 she is doing well and is deemed to have received the full benefit of her hospital stay.  Filed Vitals:   12/10/13 0605  BP: 109/67  Pulse: 60  Temp: 98 F (36.7 C)  Resp: 18   H/H: Lab Results  Component Value Date/Time   HGB 12.9 12/09/2013  5:49 AM   HCT 37.5 12/09/2013  5:49 AM    Physical Exam: General: alert, cooperative and no distress Abdomen/Uterine Fundus: Appropriately tender, non-distended, FF @ U-2 Incision: n/a Lochia: appropriate Extremities: No evidence of DVT seen on physical exam. Negative Homan's sign, no cords, calf tenderness, or significant calf/ankle edema   Discharge Diagnoses: Term Pregnancy-delivered  Discharge Information: Date: 12/18/2010 Activity: pelvic rest Diet: routine  Medications: PNV and Ibuprofen Breast feeding: Yes Contraception: Mirena, abstinence until Circumcision: desires prior to d/c Condition: stable Instructions: refer to handout Discharge to: home  Infant: Home with mother  Follow-up Information   Schedule an appointment as soon as possible [facility] (4-6 weeks for your postpartum visit)    Specialty:  Obstetrics and Gynecology   Contact information:   [address] [place] [id number] [phone]      Melanie Galloway, Melanie Galloway, CNM, WHNP-BC 12/10/2013,7:47 AM

## 2013-12-10 NOTE — Discharge Instructions (Signed)
Postpartum Care After Vaginal Delivery  °After you deliver your newborn (postpartum period), the usual stay in the hospital is 24-72 hours. If there were problems with your labor or delivery, or if you have other medical problems, you might be in the hospital longer.  °While you are in the hospital, you will receive help and instructions on how to care for yourself and your newborn during the postpartum period.  °While you are in the hospital:  °Be sure to tell your nurses if you have pain or discomfort, as well as where you feel the pain and what makes the pain worse.  °If you had an incision made near your vagina (episiotomy) or if you had some tearing during delivery, the nurses may put ice packs on your episiotomy or tear. The ice packs may help to reduce the pain and swelling.  °If you are breastfeeding, you may feel uncomfortable contractions of your uterus for a couple of weeks. This is normal. The contractions help your uterus get back to normal size.  °It is normal to have some bleeding after delivery.  °For the first 1-3 days after delivery, the flow is red and the amount may be similar to a period.  °It is common for the flow to start and stop.  °In the first few days, you may pass some small clots. Let your nurses know if you begin to pass large clots or your flow increases.  °Do not flush blood clots down the toilet before having the nurse look at them.  °During the next 3-10 days after delivery, your flow should become more watery and pink or brown-tinged in color.  °Ten to fourteen days after delivery, your flow should be a small amount of yellowish-white discharge.  °The amount of your flow will decrease over the first few weeks after delivery. Your flow may stop in 6-8 weeks. Most women have had their flow stop by 12 weeks after delivery. °You should change your sanitary pads frequently.  °Wash your hands thoroughly with soap and water for at least 20 seconds after changing pads, using the toilet,  or before holding or feeding your newborn.  °You should feel like you need to empty your bladder within the first 6-8 hours after delivery.  °In case you become weak, lightheaded, or faint, call your nurse before you get out of bed for the first time and before you take a shower for the first time.  °Within the first few days after delivery, your breasts may begin to feel tender and full. This is called engorgement. Breast tenderness usually goes away within 48-72 hours after engorgement occurs. You may also notice milk leaking from your breasts. If you are not breastfeeding, do not stimulate your breasts. Breast stimulation can make your breasts produce more milk.  °Spending as much time as possible with your newborn is very important. During this time, you and your newborn can feel close and get to know each other. Having your newborn stay in your room (rooming in) will help to strengthen the bond with your newborn. It will give you time to get to know your newborn and become comfortable caring for your newborn.  °Your hormones change after delivery. Sometimes the hormone changes can temporarily cause you to feel sad or tearful. These feelings should not last more than a few days. If these feelings last longer than that, you should talk to your caregiver.  °If desired, talk to your caregiver about methods of family planning or contraception.  °  Talk to your caregiver about immunizations. Your caregiver may want you to have the following immunizations before leaving the hospital:  °Tetanus, diphtheria, and pertussis (Tdap) or tetanus and diphtheria (Td) immunization. It is very important that you and your family (including grandparents) or others caring for your newborn are up-to-date with the Tdap or Td immunizations. The Tdap or Td immunization can help protect your newborn from getting ill.  °Rubella immunization.  °Varicella (chickenpox) immunization.  °Influenza immunization. You should receive this annual  immunization if you did not receive the immunization during your pregnancy. °Document Released: 03/22/2007 Document Revised: 02/17/2012 Document Reviewed: 01/20/2012  °Exit[employer]® Patient Information ©2015 [employer]. This information is not intended to replace advice given to you by your health care provider. Make sure you discuss any questions you have with your health care provider.  ° °Breastfeeding °Deciding to breastfeed is one of the best choices you can make for you and your baby. A change in hormones during pregnancy causes your breast tissue to grow and increases the number and size of your milk ducts. These hormones also allow proteins, sugars, and fats from your blood supply to make breast milk in your milk-producing glands. Hormones prevent breast milk from being released before your baby is born as well as prompt milk flow after birth. Once breastfeeding has begun, thoughts of your baby, as well as his or her sucking or crying, can stimulate the release of milk from your milk-producing glands.  °BENEFITS OF BREASTFEEDING °For Your Baby °· Your first milk (colostrum) helps your baby's digestive system function better.   °· There are antibodies in your milk that help your baby fight off infections.   °· Your baby has a lower incidence of asthma, allergies, and sudden infant death syndrome.   °· The nutrients in breast milk are better for your baby than infant formulas and are designed uniquely for your baby's needs.   °· Breast milk improves your baby's brain development.   °· Your baby is less likely to develop other conditions, such as childhood obesity, asthma, or type 2 diabetes mellitus.   °For You  °· Breastfeeding helps to create a very special bond between you and your baby.   °· Breastfeeding is convenient. Breast milk is always available at the correct temperature and costs nothing.   °· Breastfeeding helps to burn calories and helps you lose the weight gained during pregnancy.    °· Breastfeeding makes your uterus contract to its prepregnancy size faster and slows bleeding (lochia) after you give birth.   °· Breastfeeding helps to lower your risk of developing type 2 diabetes mellitus, osteoporosis, and breast or ovarian cancer later in life. °SIGNS THAT YOUR BABY IS HUNGRY °Early Signs of Hunger  °· Increased alertness or activity. °· Stretching. °· Movement of the head from side to side. °· Movement of the head and opening of the mouth when the corner of the mouth or cheek is stroked (rooting). °· Increased sucking sounds, smacking lips, cooing, sighing, or squeaking. °· Hand-to-mouth movements. °· Increased sucking of fingers or hands. °Late Signs of Hunger °· Fussing. °· Intermittent crying. °Extreme Signs of Hunger °Signs of extreme hunger will require calming and consoling before your baby will be able to breastfeed successfully. Do not wait for the following signs of extreme hunger to occur before you initiate breastfeeding:   °· Restlessness. °· A loud, strong cry. °·  Screaming. °BREASTFEEDING BASICS °Breastfeeding Initiation °· Find a comfortable place to sit or lie down, with your neck and back well supported. °· Place a pillow or   rolled up blanket under your baby to bring him or her to the level of your breast (if you are seated). Nursing pillows are specially designed to help support your arms and your baby while you breastfeed. °· Make sure that your baby's abdomen is facing your abdomen.   °· Gently massage your breast. With your fingertips, massage from your chest wall toward your nipple in a circular motion. This encourages milk flow. You may need to continue this action during the feeding if your milk flows slowly. °· Support your breast with 4 fingers underneath and your thumb above your nipple. Make sure your fingers are well away from your nipple and your baby's mouth.   °· Stroke your baby's lips gently with your finger or nipple.   °· When your baby's mouth is open  wide enough, quickly bring your baby to your breast, placing your entire nipple and as much of the colored area around your nipple (areola) as possible into your baby's mouth.   °¨ More areola should be visible above your baby's upper lip than below the lower lip.   °¨ Your baby's tongue should be between his or her lower gum and your breast.   °· Ensure that your baby's mouth is correctly positioned around your nipple (latched). Your baby's lips should create a seal on your breast and be turned out (everted). °· It is common for your baby to suck about 2-3 minutes in order to start the flow of breast milk. °Latching °Teaching your baby how to latch on to your breast properly is very important. An improper latch can cause nipple pain and decreased milk supply for you and poor weight gain in your baby. Also, if your baby is not latched onto your nipple properly, he or she may swallow some air during feeding. This can make your baby fussy. Burping your baby when you switch breasts during the feeding can help to get rid of the air. However, teaching your baby to latch on properly is still the best way to prevent fussiness from swallowing air while breastfeeding. °Signs that your baby has successfully latched on to your nipple:    °· Silent tugging or silent sucking, without causing you pain.   °· Swallowing heard between every 3-4 sucks.   °·  Muscle movement above and in front of his or her ears while sucking.   °Signs that your baby has not successfully latched on to nipple:  °· Sucking sounds or smacking sounds from your baby while breastfeeding. °· Nipple pain. °If you think your baby has not latched on correctly, slip your finger into the corner of your baby's mouth to break the suction and place it between your baby's gums. Attempt breastfeeding initiation again. °Signs of Successful Breastfeeding °Signs from your baby:   °· A gradual decrease in the number of sucks or complete cessation of sucking.   °· Falling  asleep.   °· Relaxation of his or her body.   °· Retention of a small amount of milk in his or her mouth.   °· Letting go of your breast by himself or herself. °Signs from you: °· Breasts that have increased in firmness, weight, and size 1-3 hours after feeding.   °· Breasts that are softer immediately after breastfeeding. °· Increased milk volume, as well as a change in milk consistency and color by the fifth day of breastfeeding.   °· Nipples that are not sore, cracked, or bleeding. °Signs That Your Baby is Getting Enough Milk °· Wetting at least 3 diapers in a 24-hour period. The urine should be clear and   pale yellow by age 5 days. °· At least 3 stools in a 24-hour period by age 5 days. The stool should be soft and yellow. °· At least 3 stools in a 24-hour period by age 7 days. The stool should be seedy and yellow. °· No loss of weight greater than 10% of birth weight during the first 3 days of age. °· Average weight gain of 4-7 ounces (113-198 g) per week after age 4 days. °· Consistent daily weight gain by age 5 days, without weight loss after the age of 2 weeks. °After a feeding, your baby may spit up a small amount. This is common. °BREASTFEEDING FREQUENCY AND DURATION °Frequent feeding will help you make more milk and can prevent sore nipples and breast engorgement. Breastfeed when you feel the need to reduce the fullness of your breasts or when your baby shows signs of hunger. This is called "breastfeeding on demand." Avoid introducing a pacifier to your baby while you are working to establish breastfeeding (the first 4-6 weeks after your baby is born). After this time you may choose to use a pacifier. Research has shown that pacifier use during the first year of a baby's life decreases the risk of sudden infant death syndrome (SIDS). °Allow your baby to feed on each breast as long as he or she wants. Breastfeed until your baby is finished feeding. When your baby unlatches or falls asleep while feeding from  the first breast, offer the second breast. Because newborns are often sleepy in the first few weeks of life, you may need to awaken your baby to get him or her to feed. °Breastfeeding times will vary from baby to baby. However, the following rules can serve as a guide to help you ensure that your baby is properly fed: °· Newborns (babies 4 weeks of age or younger) may breastfeed every 1-3 hours. °· Newborns should not go longer than 3 hours during the day or 5 hours during the night without breastfeeding. °· You should breastfeed your baby a minimum of 8 times in a 24-hour period until you begin to introduce solid foods to your baby at around 6 months of age. °BREAST MILK PUMPING °Pumping and storing breast milk allows you to ensure that your baby is exclusively fed your breast milk, even at times when you are unable to breastfeed. This is especially important if you are going back to work while you are still breastfeeding or when you are not able to be present during feedings. Your lactation consultant can give you guidelines on how long it is safe to store breast milk.  °A breast pump is a machine that allows you to pump milk from your breast into a sterile bottle. The pumped breast milk can then be stored in a refrigerator or freezer. Some breast pumps are operated by hand, while others use electricity. Ask your lactation consultant which type will work best for you. Breast pumps can be purchased, but some hospitals and breastfeeding support groups lease breast pumps on a monthly basis. A lactation consultant can teach you how to hand express breast milk, if you prefer not to use a pump.  °CARING FOR YOUR BREASTS WHILE YOU BREASTFEED °Nipples can become dry, cracked, and sore while breastfeeding. The following recommendations can help keep your breasts moisturized and healthy: °· Avoid using soap on your nipples.   °· Wear a supportive bra. Although not required, special nursing bras and tank tops are designed to  allow access to your breasts for   breastfeeding without taking off your entire bra or top. Avoid wearing underwire-style bras or extremely tight bras. °· Air dry your nipples for 3-4 minutes after each feeding.   °· Use only cotton bra pads to absorb leaked breast milk. Leaking of breast milk between feedings is normal.   °· Use lanolin on your nipples after breastfeeding. Lanolin helps to maintain your skin's normal moisture barrier. If you use pure lanolin, you do not need to wash it off before feeding your baby again. Pure lanolin is not toxic to your baby. You may also hand express a few drops of breast milk and gently massage that milk into your nipples and allow the milk to air dry. °In the first few weeks after giving birth, some women experience extremely full breasts (engorgement). Engorgement can make your breasts feel heavy, warm, and tender to the touch. Engorgement peaks within 3-5 days after you give birth. The following recommendations can help ease engorgement: °· Completely empty your breasts while breastfeeding or pumping. You may want to start by applying warm, moist heat (in the shower or with warm water-soaked hand towels) just before feeding or pumping. This increases circulation and helps the milk flow. If your baby does not completely empty your breasts while breastfeeding, pump any extra milk after he or she is finished. °· Wear a snug bra (nursing or regular) or tank top for 1-2 days to signal your body to slightly decrease milk production. °· Apply ice packs to your breasts, unless this is too uncomfortable for you. °· Make sure that your baby is latched on and positioned properly while breastfeeding. °If engorgement persists after 48 hours of following these recommendations, contact your health care provider or a lactation consultant. °OVERALL HEALTH CARE RECOMMENDATIONS WHILE BREASTFEEDING °· Eat healthy foods. Alternate between meals and snacks, eating 3 of each per day. Because what you  eat affects your breast milk, some of the foods may make your baby more irritable than usual. Avoid eating these foods if you are sure that they are negatively affecting your baby. °· Drink milk, fruit juice, and water to satisfy your thirst (about 10 glasses a day).   °· Rest often, relax, and continue to take your prenatal vitamins to prevent fatigue, stress, and anemia. °· Continue breast self-awareness checks. °· Avoid chewing and smoking tobacco. °· Avoid alcohol and drug use. °Some medicines that may be harmful to your baby can pass through breast milk. It is important to ask your health care provider before taking any medicine, including all over-the-counter and prescription medicine as well as vitamin and herbal supplements. °It is possible to become pregnant while breastfeeding. If birth control is desired, ask your health care provider about options that will be safe for your baby. °SEEK MEDICAL CARE IF:  °· You feel like you want to stop breastfeeding or have become frustrated with breastfeeding. °· You have painful breasts or nipples. °· Your nipples are cracked or bleeding. °· Your breasts are red, tender, or warm. °· You have a swollen area on either breast. °· You have a fever or chills. °· You have nausea or vomiting. °· You have drainage other than breast milk from your nipples. °· Your breasts do not become full before feedings by the fifth day after you give birth. °· You feel sad and depressed. °· Your baby is too sleepy to eat well. °· Your baby is having trouble sleeping.   °· Your baby is wetting less than 3 diapers in a 24-hour period. °· Your baby has   less than 3 stools in a 24-hour period. °· Your baby's skin or the white part of his or her eyes becomes yellow.   °· Your baby is not gaining weight by 5 days of age. °SEEK IMMEDIATE MEDICAL CARE IF:  °· Your baby is overly tired (lethargic) and does not want to wake up and feed. °· Your baby develops an unexplained fever. °Document Released:  05/25/2005 Document Revised: 05/30/2013 Document Reviewed: 11/16/2012 °Exit[employer]® Patient Information ©2015 [employer]. This information is not intended to replace advice given to you by your health care provider. Make sure you discuss any questions you have with your health care provider. ° °

## 2013-12-10 NOTE — Progress Notes (Signed)
Circumcision Counseling Progress Note  Patient desires circumcision for her female infant.  Circumcision procedure details discussed, risks and benefits of procedure were also discussed.  These include but are not limited to: Benefits of circumcision in men include reduction in the rates of urinary tract infection (UTI), penile cancer, some sexually transmitted infections, penile inflammatory and retractile disorders, as well as easier hygiene.  Risks include bleeding , infection, injury of glans which may lead to penile deformity or urinary tract issues, unsatisfactory cosmetic appearance and other potential complications related to the procedure.  It was emphasized that this is an elective procedure.  Patient wants to proceed with circumcision; written informed consent obtained.  Will do circumcision soon, routine circumcision and post circumcision care ordered for the infant.  Rulon AbideKeli Gissella Niblack, M.D. [facility] 12/10/2013 10:50 AM

## 2013-12-16 ENCOUNTER — Other Ambulatory Visit: Payer: Self-pay | Admitting: Women's Health

## 2013-12-20 ENCOUNTER — Telehealth: Payer: Self-pay | Admitting: *Deleted

## 2013-12-20 NOTE — Telephone Encounter (Signed)
Contacted patient and informed that we have completed your FMLA paperwork, we need you to sign a ROI.  Pt verbalizes understanding.

## 2014-01-08 ENCOUNTER — Encounter: Payer: Self-pay | Admitting: Family Medicine

## 2014-01-08 ENCOUNTER — Ambulatory Visit (INDEPENDENT_AMBULATORY_CARE_PROVIDER_SITE_OTHER): Payer: BC Managed Care – PPO | Admitting: Family Medicine

## 2014-01-08 NOTE — Progress Notes (Signed)
Subjective:    Melanie Galloway is a 23 y.o. [id number] Caucasian female who presents for a postpartum visit. She is 5 weeks postpartum following a spontaneous vaginal delivery. I have fully reviewed the prenatal and intrapartum course. The delivery was at 38 gestational weeks. Outcome: spontaneous vaginal delivery. Anesthesia: epidural. Postpartum course has been uncomplicated. Baby's course has been uncomplicated. Baby is feeding by breast. Bleeding staining only. Bowel function is normal. Bladder function is normal. Patient is not sexually active. Contraception method is none. Postpartum depression screening: negative.  The following portions of the patient's history were reviewed and updated as appropriate: allergies, current medications, past medical history, past surgical history and problem list.  Review of Systems Pertinent items are noted in HPI.   Filed Vitals:   01/08/14 1610  BP: 109/66  Pulse: 83  Height: 5' 4.5" (1.638 m)  Weight: 119 lb (53.978 kg)    Objective:     General:  alert, cooperative and no distress   Breasts:  deferred, no complaints  Lungs: clear to auscultation bilaterally  Heart:  regular rate and rhythm  Abdomen: soft, nontender  declined     Assessment:   normal postpartum exam 5 wks s/p SVD Depression screening Contraception counseling   Plan:   Contraception: none, declines any form Follow up in: prn

## 2014-01-08 NOTE — Patient Instructions (Signed)
Contraception Choices  Birth control (contraception) is the use of any methods or devices to stop pregnancy from happening. Below are some methods to help avoid pregnancy.  HORMONAL BIRTH CONTROL  · A small tube put under the skin of the upper arm (implant). The tube can stay in place for 3 years. The implant must be taken out after 3 years.  · Shots given every 3 months.  · Pills taken every day.  · Patches that are changed once a week.  · A ring put into the vagina (vaginal ring). The ring is left in place for 3 weeks and removed for 1 week. Then, a new ring is put in the vagina.  · Emergency birth control pills taken after unprotected sex (intercourse).  BARRIER BIRTH CONTROL   · A thin covering worn on the penis (female condom) during sex.  · A soft, loose covering put into the vagina (female condom) before sex.  · A rubber bowl that sits over the cervix (diaphragm). The bowl must be made for you. The bowl is put into the vagina before sex. The bowl is left in place for 6 to 8 hours after sex.  · A small, soft cup that fits over the cervix (cervical cap). The cup must be made for you. The cup can be left in place for 48 hours after sex.  · A sponge that is put into the vagina before sex.  · A chemical that kills or stops sperm from getting into the cervix and uterus (spermicide). The chemical may be a cream, jelly, foam, or pill.  INTRAUTERINE (IUD) BIRTH CONTROL   · IUD birth control is a small, T-shaped piece of plastic. The plastic is put inside the uterus. There are 2 types of IUD:  ¨ Copper IUD. The IUD is covered in copper wire. The copper makes a fluid that kills sperm. It can stay in place for 10 years.  ¨ Hormone IUD. The hormone stops pregnancy from happening. It can stay in place for 5 years.  PERMANENT METHODS  · When the woman has her fallopian tubes sealed, tied, or blocked during surgery. This stops the egg from traveling to the uterus.  · The doctor places a small coil or insert into each fallopian  tube. This causes scar tissue to form and blocks the fallopian tubes.  · When the female has the tubes that carry sperm tied off (vasectomy).  NATURAL FAMILY PLANNING BIRTH CONTROL   · Natural family planning means not having sex or using barrier birth control on the days the woman could become pregnant.  · Use a calendar to keep track of the length of each period and know the days she can get pregnant.  · Avoid sex during ovulation.  · Use a thermometer to measure body temperature. Also watch for symptoms of ovulation.  · Time sex to be after the woman has ovulated.  Use condoms to help protect yourself against sexually transmitted infections (STIs). Do this no matter what type of birth control you use. Talk to your doctor about which type of birth control is best for you.  Document Released: 03/22/2009 Document Revised: 05/30/2013 Document Reviewed: 12/14/2012  ExitCare® Patient Information ©2015 [employer]. This information is not intended to replace advice given to you by your health care provider. Make sure you discuss any questions you have with your health care provider.

## 2014-04-09 ENCOUNTER — Encounter: Payer: Self-pay | Admitting: Family Medicine

## 2014-04-18 ENCOUNTER — Emergency Department (HOSPITAL_COMMUNITY)

## 2014-04-18 ENCOUNTER — Encounter (HOSPITAL_COMMUNITY): Payer: Self-pay | Admitting: Emergency Medicine

## 2014-04-18 ENCOUNTER — Emergency Department (HOSPITAL_COMMUNITY)
Admission: EM | Admit: 2014-04-18 | Discharge: 2014-04-18 | Disposition: A | Discharge status: Home / Self Care | Attending: Emergency Medicine | Admitting: Emergency Medicine

## 2014-04-18 DIAGNOSIS — Z79899 Other long term (current) drug therapy: Secondary | ICD-10-CM | POA: Diagnosis not present

## 2014-04-18 DIAGNOSIS — R1011 Right upper quadrant pain: Secondary | ICD-10-CM

## 2014-04-18 DIAGNOSIS — K921 Melena: Secondary | ICD-10-CM | POA: Insufficient documentation

## 2014-04-18 DIAGNOSIS — K644 Residual hemorrhoidal skin tags: Secondary | ICD-10-CM | POA: Diagnosis not present

## 2014-04-18 DIAGNOSIS — O9963 Diseases of the digestive system complicating the puerperium: Secondary | ICD-10-CM | POA: Diagnosis not present

## 2014-04-18 DIAGNOSIS — Z87891 Personal history of nicotine dependence: Secondary | ICD-10-CM | POA: Diagnosis not present

## 2014-04-18 DIAGNOSIS — O9953 Diseases of the respiratory system complicating the puerperium: Secondary | ICD-10-CM | POA: Diagnosis not present

## 2014-04-18 DIAGNOSIS — J45909 Unspecified asthma, uncomplicated: Secondary | ICD-10-CM | POA: Insufficient documentation

## 2014-04-18 DIAGNOSIS — Z3202 Encounter for pregnancy test, result negative: Secondary | ICD-10-CM | POA: Diagnosis not present

## 2014-04-18 DIAGNOSIS — R112 Nausea with vomiting, unspecified: Secondary | ICD-10-CM

## 2014-04-18 DIAGNOSIS — Z8679 Personal history of other diseases of the circulatory system: Secondary | ICD-10-CM | POA: Insufficient documentation

## 2014-04-18 LAB — COMPREHENSIVE METABOLIC PANEL
ALBUMIN: 4.3 g/dL (ref 3.5–5.2)
ALT: 16 U/L (ref 0–35)
AST: 18 U/L (ref 0–37)
Alkaline Phosphatase: 61 U/L (ref 39–117)
Anion gap: 15 (ref 5–15)
BILIRUBIN TOTAL: 0.4 mg/dL (ref 0.3–1.2)
BUN: 8 mg/dL (ref 6–23)
CHLORIDE: 107 meq/L (ref 96–112)
CO2: 24 mEq/L (ref 19–32)
CREATININE: 0.7 mg/dL (ref 0.50–1.10)
Calcium: 9.6 mg/dL (ref 8.4–10.5)
GFR calc Af Amer: >90 mL/min (ref >90)
Glucose, Bld: 90 mg/dL (ref 70–99)
Potassium: 3.6 mEq/L — ABNORMAL LOW (ref 3.7–5.3)
Sodium: 146 mEq/L (ref 137–147)
Total Protein: 7.5 g/dL (ref 6.0–8.3)

## 2014-04-18 LAB — URINE MICROSCOPIC-ADD ON

## 2014-04-18 LAB — CBC WITH DIFFERENTIAL/PLATELET
BASOS ABS: 0 10*3/uL (ref 0.0–0.1)
Basophils Relative: 0 % (ref 0–1)
Eosinophils Absolute: 0.1 10*3/uL (ref 0.0–0.7)
Eosinophils Relative: 1 % (ref 0–5)
HEMATOCRIT: 40.6 % (ref 36.0–46.0)
Hemoglobin: 13.7 g/dL (ref 12.0–15.0)
LYMPHS PCT: 35 % (ref 12–46)
Lymphs Abs: 2.3 10*3/uL (ref 0.7–4.0)
MCH: 30.6 pg (ref 26.0–34.0)
MCHC: 33.7 g/dL (ref 30.0–36.0)
MCV: 90.8 fL (ref 78.0–100.0)
MONO ABS: 0.4 10*3/uL (ref 0.1–1.0)
Monocytes Relative: 7 % (ref 3–12)
NEUTROS ABS: 3.8 10*3/uL (ref 1.7–7.7)
NEUTROS PCT: 57 % (ref 43–77)
PLATELETS: 233 10*3/uL (ref 150–400)
RBC: 4.47 MIL/uL (ref 3.87–5.11)
RDW: 12.2 % (ref 11.5–15.5)
WBC: 6.7 10*3/uL (ref 4.0–10.5)

## 2014-04-18 LAB — POC URINE PREG, ED: Preg Test, Ur: NEGATIVE

## 2014-04-18 LAB — URINALYSIS, ROUTINE W REFLEX MICROSCOPIC
Bilirubin Urine: NEGATIVE
GLUCOSE, UA: NEGATIVE mg/dL
KETONES UR: NEGATIVE mg/dL
Leukocytes, UA: NEGATIVE
NITRITE: NEGATIVE
PH: 7 (ref 5.0–8.0)
Protein, ur: NEGATIVE mg/dL
SPECIFIC GRAVITY, URINE: 1.025 (ref 1.005–1.030)
Urobilinogen, UA: 1 mg/dL (ref 0.0–1.0)

## 2014-04-18 LAB — LIPASE, BLOOD: LIPASE: 34 U/L (ref 11–59)

## 2014-04-18 LAB — POC OCCULT BLOOD, ED: Fecal Occult Bld: NEGATIVE

## 2014-04-18 MED ORDER — ONDANSETRON HCL 4 MG/2ML IJ SOLN
4.0000 mg | Freq: Once | INTRAMUSCULAR | Status: AC
  Administered 2014-04-18: 4 mg via INTRAVENOUS
  Filled 2014-04-18: qty 2

## 2014-04-18 MED ORDER — OMEPRAZOLE 20 MG PO CPDR: 20.0000 mg | DELAYED_RELEASE_CAPSULE | Freq: Every day | ORAL | Status: DC

## 2014-04-18 MED ORDER — MORPHINE SULFATE 4 MG/ML IJ SOLN
4.0000 mg | Freq: Once | INTRAMUSCULAR | Status: DC
  Filled 2014-04-18: qty 1

## 2014-04-18 MED ORDER — HYDROCODONE-ACETAMINOPHEN 5-325 MG PO TABS: 1.0000 | ORAL_TABLET | ORAL | Status: DC | PRN

## 2014-04-18 MED ORDER — ONDANSETRON HCL 4 MG PO TABS: 4.0000 mg | ORAL_TABLET | Freq: Four times a day (QID) | ORAL | Status: DC

## 2014-04-18 NOTE — ED Provider Notes (Signed)
CSN: [id number]     Arrival date & time 04/18/14  1136 History   First MD Initiated Contact with Patient 04/18/14 1223     Chief Complaint  Patient presents with  . Abdominal Pain  . Rectal Bleeding     (Consider location/radiation/quality/duration/timing/severity/associated sxs/prior Treatment) HPI Comments: Patient who is currently 4 months post partum presents today with a chief complaint of abdominal pain.  She reports that the pain is located in the epigastric and RUQ.  Pain does not radiate.  Pain has been intermittent over the past 3 months and becomes worse after eating.  She reports that over the past two days the pain has been associated with nausea and vomiting.  She has not taken anything for symptoms prior to arrival.  She denies fever, chills, diarrhea, constipation, vaginal bleeding, vaginal discharge, chest pain, or SOB.  She denies prior history of GERD or Gallbladder disease.  No prior abdominal surgeries.  She also reports that she has had hematochezia intermittently over the past 2 months.  She reports that the blood is bright red and mixed in with her stool.  She denies constipation or straining with bowel movements.  No pain with bowel movements.  She denies melena.  Denies any blood in her emesis.  No prior history of Hemorrhoids.  She is not on any anticoagulants.    The history is provided by the patient.    Past Medical History  Diagnosis Date  . Stomach ulcer   . Migraines   . Asthma    Past Surgical History  Procedure Laterality Date  . Eye muscle surgery    . External ear surgery     Family History  Problem Relation Age of Onset  . Cancer Mother   . Asthma Sister    History  Substance Use Topics  . Smoking status: Former Smoker    Types: Cigarettes    Quit date: 04/26/2013  . Smokeless tobacco: Never Used  . Alcohol Use: No   OB History    Gravida Para Term Preterm AB TAB SAB Ectopic Multiple Living   1 1 1       1      Review of Systems   All other systems reviewed and are negative.     Allergies  Review of patient's allergies indicates no known allergies.  Home Medications   Prior to Admission medications   Medication Sig Start Date End Date Taking? Authorizing Provider  albuterol (PROVENTIL HFA;VENTOLIN HFA) 108 (90 BASE) MCG/ACT inhaler Inhale 1-2 puffs into the lungs every 6 (six) hours as needed for wheezing or shortness of breath. 09/05/13  Yes Delbert PhenixLinda M Barefoot, NP  ibuprofen (ADVIL,MOTRIN) 600 MG tablet Take 1 tablet (600 mg total) by mouth every 6 (six) hours as needed for mild pain, moderate pain or cramping. 12/10/13  Yes Marge DuncansKimberly Randall Booker, CNM  Prenatal Vit-Fe Fumarate-FA (PRENATAL MULTIVITAMIN) TABS tablet Take 1 tablet by mouth daily at 12 noon. 11/08/13  Yes Aviva SignsMarie L Williams, CNM  calcium carbonate (TUMS - DOSED IN MG ELEMENTAL CALCIUM) 500 MG chewable tablet Chew 2 tablets by mouth daily as needed for indigestion or heartburn.    Historical Provider, MD   BP 118/61 mmHg  Pulse 106  Temp(Src) 97.5 F (36.4 C) (Oral)  Resp 16  Ht 5\' 7"  (1.702 m)  Wt 117 lb (53.071 kg)  BMI 18.32 kg/m2  SpO2 98% Physical Exam  Constitutional: She appears well-developed and well-nourished.  HENT:  Head: Normocephalic and atraumatic.  Mouth/Throat: Oropharynx  is clear and moist.  Neck: Normal range of motion. Neck supple.  Cardiovascular: Normal rate, regular rhythm and normal heart sounds.   Pulmonary/Chest: Effort normal and breath sounds normal.  Abdominal: Soft. Bowel sounds are normal. She exhibits no distension and no mass. There is tenderness in the right upper quadrant and epigastric area. There is positive Murphy's sign. There is no rebound and no guarding.  Genitourinary: Rectal exam shows external hemorrhoid. Rectal exam shows no fissure and no mass. Guaiac negative stool.  Stool appears to be brown in color with rectal exam.   Musculoskeletal: Normal range of motion.  Neurological: She is alert.  Skin:  Skin is warm and dry.  Psychiatric: She has a normal mood and affect.  Nursing note and vitals reviewed.   ED Course  Procedures (including critical care time) Labs Review Labs Reviewed  CBC WITH DIFFERENTIAL  COMPREHENSIVE METABOLIC PANEL  LIPASE, BLOOD  URINALYSIS, ROUTINE W REFLEX MICROSCOPIC  OCCULT BLOOD X 1 CARD TO LAB, STOOL  POC URINE PREG, ED    Imaging Review [place] Abdomen Complete  04/18/2014   CLINICAL DATA:  Right upper quadrant pain  EXAM: ULTRASOUND ABDOMEN COMPLETE  COMPARISON:  None.  FINDINGS: Gallbladder: No gallstones or wall thickening visualized. No sonographic Murphy sign noted.  Common bile duct: Diameter: 4.6 mm  Liver: No focal lesion identified. Within normal limits in parenchymal echogenicity.  IVC: No abnormality visualized.  Pancreas: Visualized portion unremarkable.  Spleen: Size and appearance within normal limits.  Right Kidney: Length: 10.3 cm. Echogenicity within normal limits. No mass or hydronephrosis visualized.  Left Kidney: Length: 10.8 cm. Echogenicity within normal limits. No mass or hydronephrosis visualized.  Abdominal aorta: No aneurysm visualized.  Other findings: None.  IMPRESSION: Normal abdominal ultrasound.   Electronically Signed   By: Elige KoHetal  Patel   On: 04/18/2014 14:33     EKG Interpretation None     3:31 PM Reassessed patient.  She reports that her pain and nausea are controlled.  Patient tolerating PO liquids.  Abdomen soft with mild tenderness to palpation of the RUQ.   MDM   Final diagnoses:  RUQ abdominal pain   Patient who is 4 months post partum presents today with intermittent pain of the RUQ and Epigastric area of the abdomen.  Pain associated with nausea and vomiting over the past couple of days.  Labs today unremarkable.  Urine pregnancy negative.  Abdominal ultrasound negative.  She is also complaining of hematochezia.  Hemoccult is negative. Patient hemodynamically stable.  Hemoglobin is also stable.  Pain and nausea  controlled in the ED.  Feel that the patient is stable for discharge.  Patient discharged home with pain medication and Zofran.  Patient given referral to GI.  Return precautions given.      Santiago GladHeather Ardie Mclennan, PA-C 04/20/14 2254  Raeford RazorStephen Kohut, MD 04/20/14 [phone]

## 2014-04-18 NOTE — ED Notes (Signed)
Pt c/o headaches, upper abd pain, and rectal bleeding x almost 4 months.  Problems started 3 wks after she had a baby and baby is now 254 months old.  Pt states she has been having BRB per rectum.  Pt states that her bowel movements have been normal.  Breastfeeding also.

## 2015-07-08 IMAGING — CT CT HEAD W/O CM
2 series · 16 of 30 positions shown, 18 images · non-contrast
Comparison: None.

CLINICAL DATA: Fall.  Headache and dizziness.

EXAM:
CT HEAD WITHOUT CONTRAST
TECHNIQUE: Contiguous axial images were obtained from the base of the skull
through the vertex without intravenous contrast.

[Series 2: head w/o · axial · non-contrast · 0.40mm/px · z∈[+95,+215]mm · 8 of 32 slices shown, 10 images]
[im 4/32  brain]
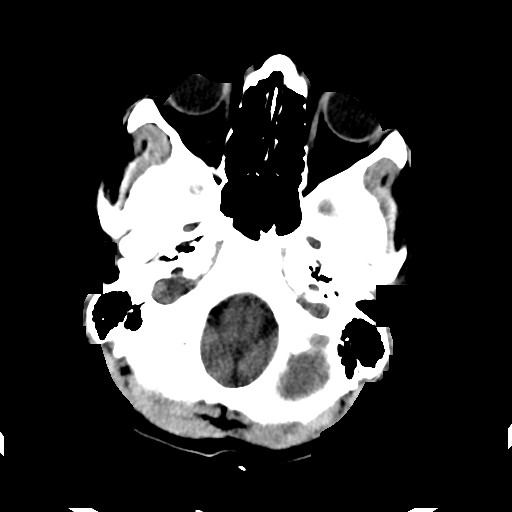
[im 4/32  bone]
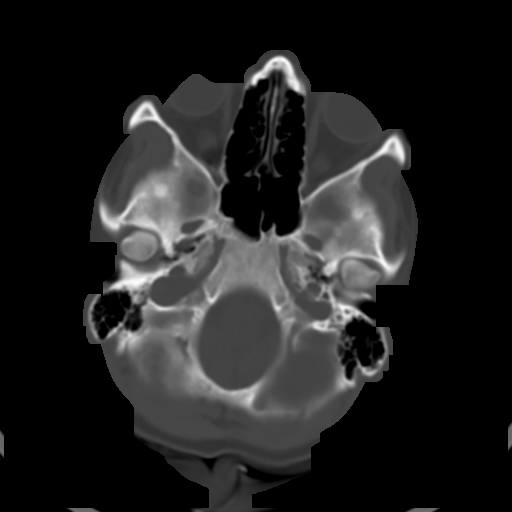
[im 7/32  brain]
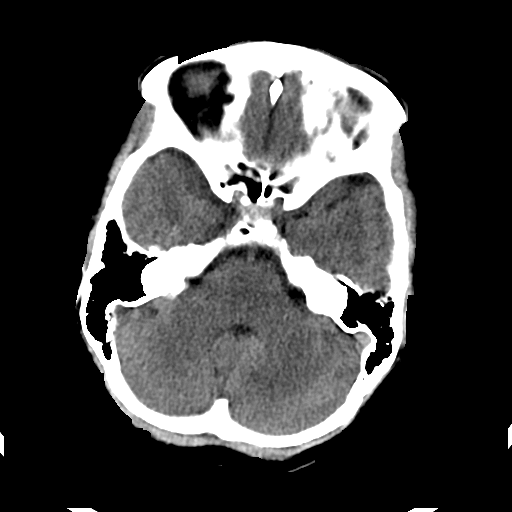
[im 11/32  brain]
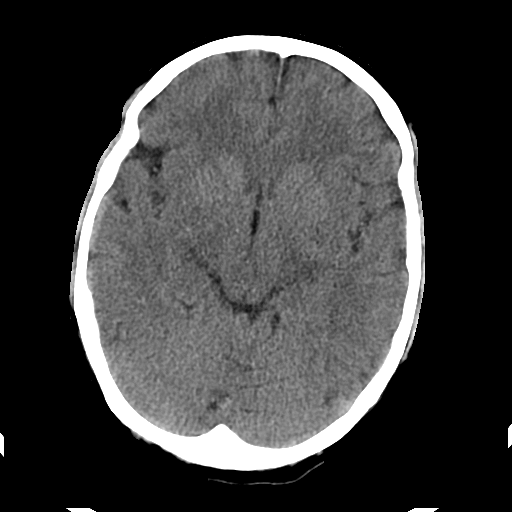
[im 14/32  brain]
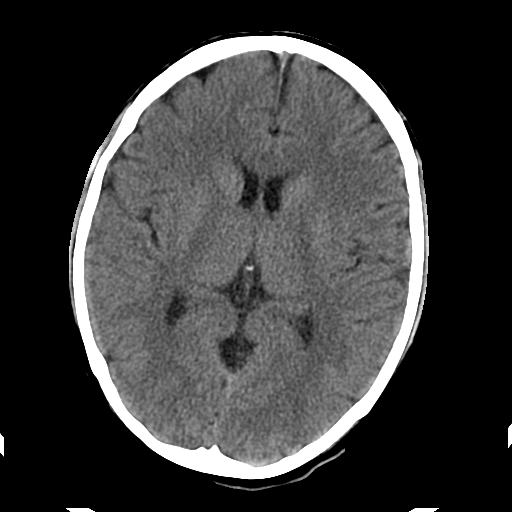
[im 18/32  brain]
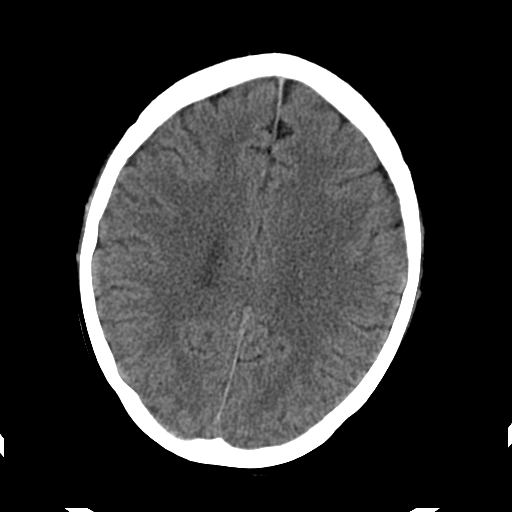
[im 18/32  bone]
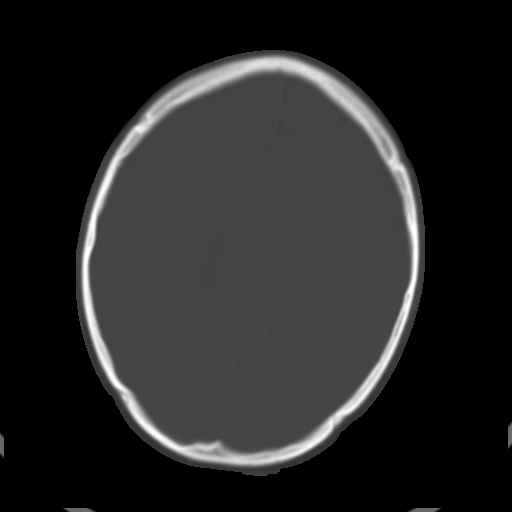
[im 21/32  brain]
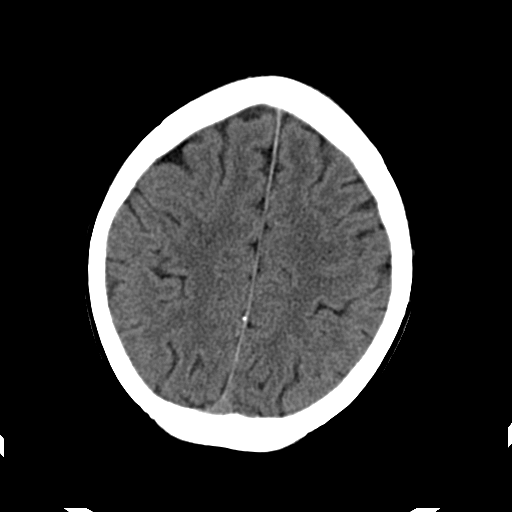
[im 25/32  brain]
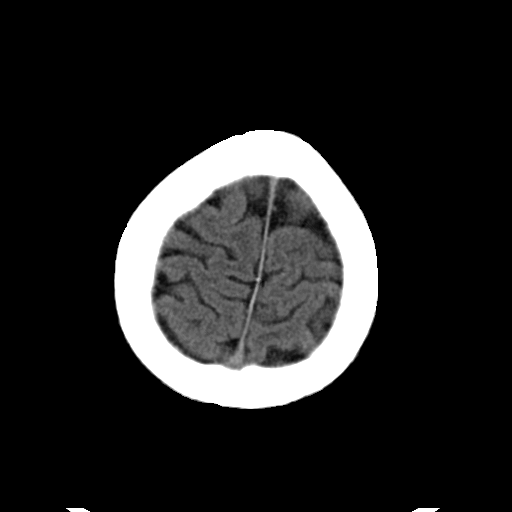
[im 28/32  brain]
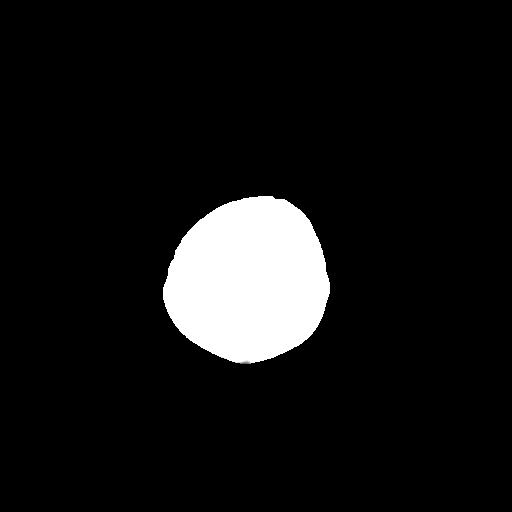

[Series 3: head w/o bone · axial · non-contrast · 0.40mm/px · z∈[+95,+218]mm · 8 of 63 slices shown]
[im 7/63  bone]
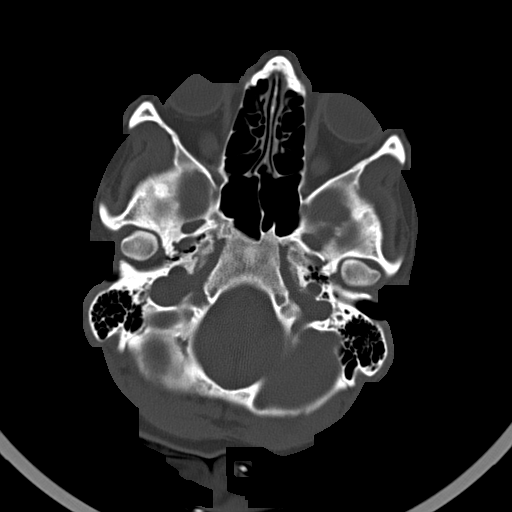
[im 14/63  bone]
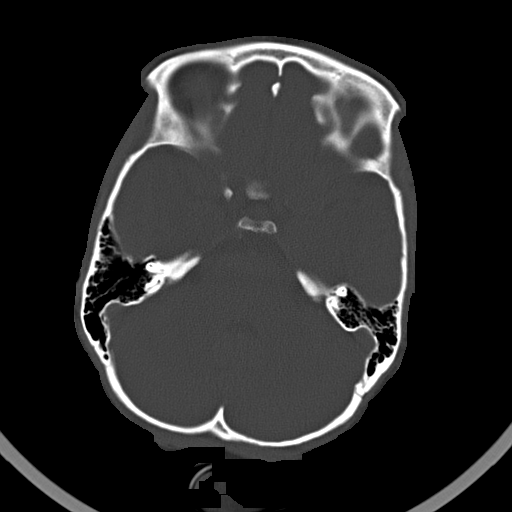
[im 20/63  bone]
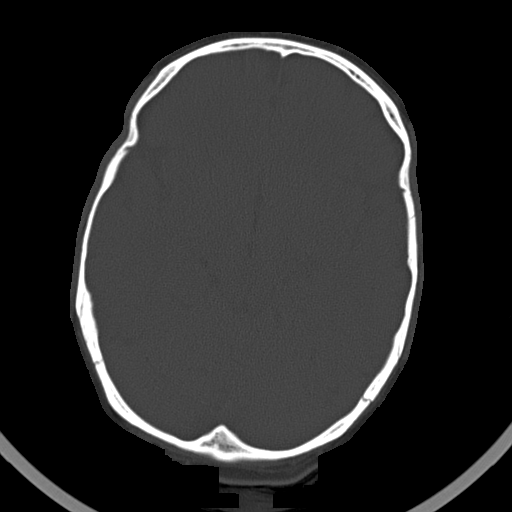
[im 27/63  bone]
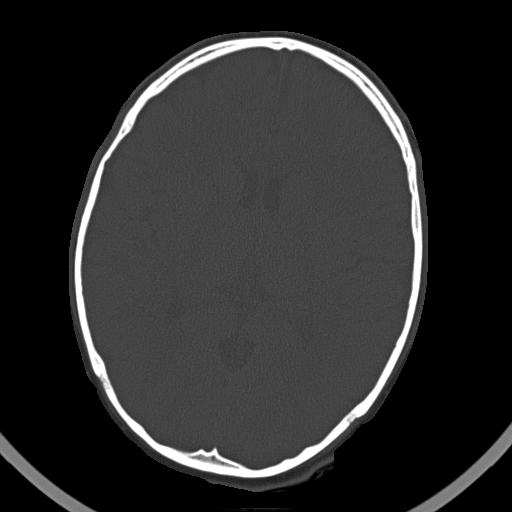
[im 36/63  bone]
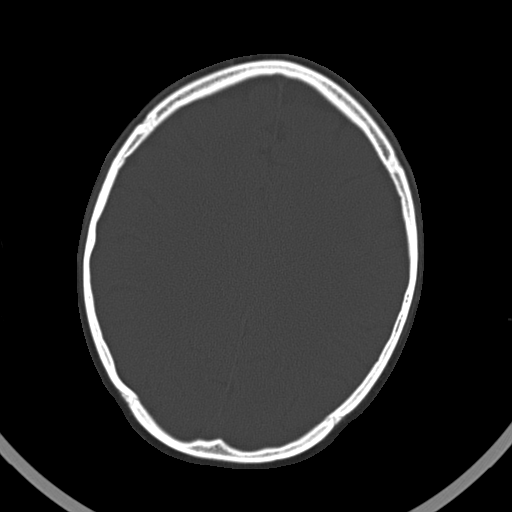
[im 43/63  bone]
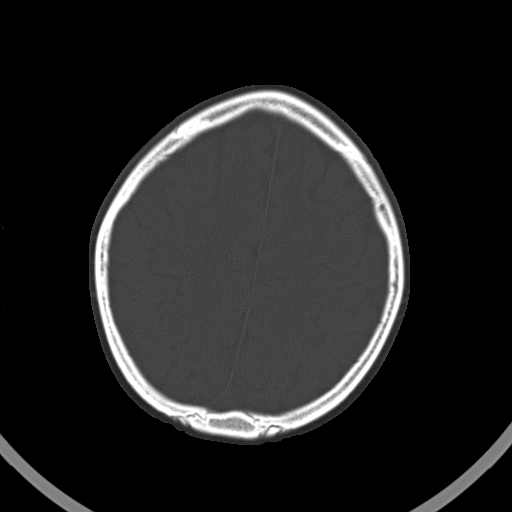
[im 49/63  bone]
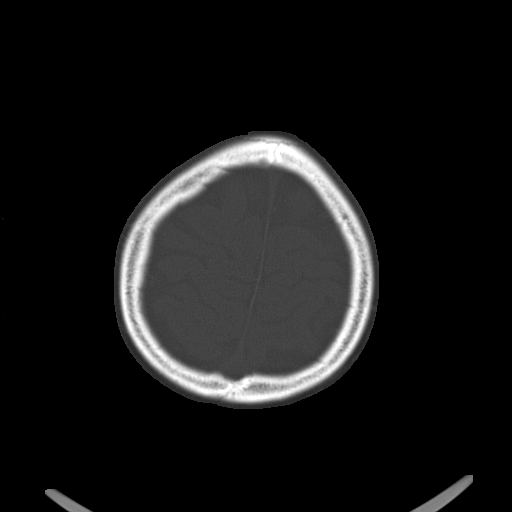
[im 56/63  bone]
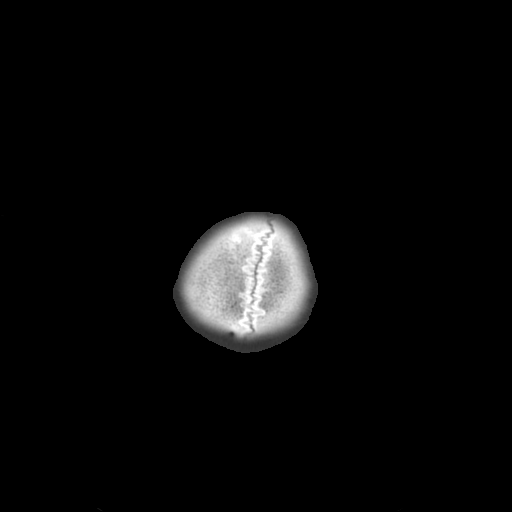

[16 of 30 positions shown; findings below may reference images not displayed]

FINDINGS: Skull and Sinuses:Negative for fracture. Adenoid tonsil enlargement.

Orbits: No acute abnormality.

Brain: No evidence of acute abnormality, such as acute infarction,
hemorrhage, hydrocephalus, or mass lesion/mass effect.
IMPRESSION: Negative head CT.

## 2015-10-24 ENCOUNTER — Encounter (HOSPITAL_COMMUNITY): Payer: Self-pay | Admitting: Emergency Medicine

## 2015-10-24 ENCOUNTER — Emergency Department (HOSPITAL_COMMUNITY)

## 2015-10-24 ENCOUNTER — Emergency Department (HOSPITAL_COMMUNITY)
Admission: EM | Admit: 2015-10-24 | Discharge: 2015-10-24 | Disposition: A | Discharge status: Home / Self Care | Attending: Emergency Medicine | Admitting: Emergency Medicine

## 2015-10-24 DIAGNOSIS — R1030 Lower abdominal pain, unspecified: Secondary | ICD-10-CM | POA: Diagnosis not present

## 2015-10-24 DIAGNOSIS — R102 Pelvic and perineal pain: Secondary | ICD-10-CM | POA: Diagnosis present

## 2015-10-24 DIAGNOSIS — Z87891 Personal history of nicotine dependence: Secondary | ICD-10-CM | POA: Diagnosis not present

## 2015-10-24 DIAGNOSIS — R0789 Other chest pain: Secondary | ICD-10-CM | POA: Diagnosis not present

## 2015-10-24 DIAGNOSIS — N730 Acute parametritis and pelvic cellulitis: Secondary | ICD-10-CM

## 2015-10-24 DIAGNOSIS — J45909 Unspecified asthma, uncomplicated: Secondary | ICD-10-CM | POA: Insufficient documentation

## 2015-10-24 LAB — CBC
HCT: 41.3 % (ref 36.0–46.0)
HEMOGLOBIN: 14.2 g/dL (ref 12.0–15.0)
MCH: 30.9 pg (ref 26.0–34.0)
MCHC: 34.4 g/dL (ref 30.0–36.0)
MCV: 90 fL (ref 78.0–100.0)
Platelets: 240 10*3/uL (ref 150–400)
RBC: 4.59 MIL/uL (ref 3.87–5.11)
RDW: 13.2 % (ref 11.5–15.5)
WBC: 5.6 10*3/uL (ref 4.0–10.5)

## 2015-10-24 LAB — WET PREP, GENITAL
Clue Cells Wet Prep HPF POC: NONE SEEN
SPERM: NONE SEEN
TRICH WET PREP: NONE SEEN
YEAST WET PREP: NONE SEEN

## 2015-10-24 LAB — COMPREHENSIVE METABOLIC PANEL
ALT: 12 U/L — ABNORMAL LOW (ref 14–54)
ANION GAP: 7 (ref 5–15)
AST: 17 U/L (ref 15–41)
Albumin: 4.7 g/dL (ref 3.5–5.0)
Alkaline Phosphatase: 46 U/L (ref 38–126)
BILIRUBIN TOTAL: 0.4 mg/dL (ref 0.3–1.2)
BUN: 10 mg/dL (ref 6–20)
CALCIUM: 9.3 mg/dL (ref 8.9–10.3)
CO2: 26 mmol/L (ref 22–32)
Chloride: 108 mmol/L (ref 101–111)
Creatinine, Ser: 0.85 mg/dL (ref 0.44–1.00)
GFR calc non Af Amer: >60 mL/min (ref >60)
Glucose, Bld: 94 mg/dL (ref 65–99)
Potassium: 4 mmol/L (ref 3.5–5.1)
Sodium: 141 mmol/L (ref 135–145)
TOTAL PROTEIN: 7.3 g/dL (ref 6.5–8.1)

## 2015-10-24 LAB — TROPONIN I: Troponin I: <0.03 ng/mL (ref <0.031)

## 2015-10-24 LAB — I-STAT TROPONIN, ED: Troponin i, poc: 0 ng/mL (ref 0.00–0.08)

## 2015-10-24 LAB — URINALYSIS, ROUTINE W REFLEX MICROSCOPIC
Bilirubin Urine: NEGATIVE
Glucose, UA: NEGATIVE mg/dL
HGB URINE DIPSTICK: NEGATIVE
Ketones, ur: NEGATIVE mg/dL
Leukocytes, UA: NEGATIVE
Nitrite: NEGATIVE
Protein, ur: NEGATIVE mg/dL
SPECIFIC GRAVITY, URINE: 1.022 (ref 1.005–1.030)
pH: 6 (ref 5.0–8.0)

## 2015-10-24 LAB — LIPASE, BLOOD: Lipase: 29 U/L (ref 11–51)

## 2015-10-24 LAB — I-STAT BETA HCG BLOOD, ED (MC, WL, AP ONLY)

## 2015-10-24 MED ORDER — METRONIDAZOLE 500 MG PO TABS: 500.0000 mg | ORAL_TABLET | Freq: Two times a day (BID) | ORAL | Status: DC

## 2015-10-24 MED ORDER — DOXYCYCLINE HYCLATE 100 MG PO CAPS: 100.0000 mg | ORAL_CAPSULE | Freq: Two times a day (BID) | ORAL | Status: AC

## 2015-10-24 MED ORDER — CEFTRIAXONE SODIUM 250 MG IJ SOLR
250.0000 mg | Freq: Once | INTRAMUSCULAR | Status: AC
  Administered 2015-10-24: 250 mg via INTRAMUSCULAR
  Filled 2015-10-24: qty 250

## 2015-10-24 MED ORDER — IBUPROFEN 200 MG PO TABS
600.0000 mg | ORAL_TABLET | Freq: Once | ORAL | Status: AC
  Administered 2015-10-24: 600 mg via ORAL
  Filled 2015-10-24: qty 3

## 2015-10-24 MED ORDER — STERILE WATER FOR INJECTION IJ SOLN
INTRAMUSCULAR | Status: AC
  Administered 2015-10-24: 10 mL
  Filled 2015-10-24: qty 10

## 2015-10-24 MED ORDER — AZITHROMYCIN 250 MG PO TABS
1000.0000 mg | ORAL_TABLET | Freq: Once | ORAL | Status: AC
  Administered 2015-10-24: 1000 mg via ORAL
  Filled 2015-10-24: qty 4

## 2015-10-24 NOTE — Progress Notes (Signed)
Pt reports that she does not live in [place] only will be here for 32 days States she has not seen a dr "in years"  Pt states she called [employer] customer service earlier to verify [facility] adn was also [facility]

## 2015-10-24 NOTE — ED Provider Notes (Signed)
CSN: [id number]     Arrival date & time 10/24/15  1154 History   First MD Initiated Contact with Patient 10/24/15 1428     Chief Complaint  Patient presents with  . Vaginal Pain  . Abdominal Pain     (Consider location/radiation/quality/duration/timing/severity/associated sxs/prior Treatment) HPI   1 month ago, felt like tampon was stuck, had intercourse and felt like vulva was pruritic and swollen-this has since resolved, had discharge 1wk later which improved then came back, then had menses which completed days ago however had foul odor.  Had strep throat in February didn't get rx.  Now having chest pain, pressure for 3 mos, when leans over, feels like heart skipping a beat, feeling short of breath.  Not on OCPs.  No pain or swelling in legs.   Everything hurts from neck to hips.  Abdominal pain "haven't thought of it much" likely several months.   Has GI doctor/neurologist,   Past Medical History  Diagnosis Date  . Stomach ulcer   . Migraines   . Asthma    Past Surgical History  Procedure Laterality Date  . Eye muscle surgery    . External ear surgery     Family History  Problem Relation Age of Onset  . Cancer Mother   . Asthma Sister    Social History  Substance Use Topics  . Smoking status: Former Smoker    Types: Cigarettes    Quit date: 04/26/2013  . Smokeless tobacco: Never Used  . Alcohol Use: No   OB History    Gravida Para Term Preterm AB TAB SAB Ectopic Multiple Living   1 1 1       1      Review of Systems  Constitutional: Negative for fever.  HENT: Negative for sore throat.   Eyes: Negative for visual disturbance.  Respiratory: Positive for cough and shortness of breath.   Cardiovascular: Positive for chest pain. Negative for leg swelling.  Gastrointestinal: Positive for abdominal pain. Negative for nausea, vomiting and diarrhea.  Genitourinary: Positive for vaginal bleeding and vaginal discharge. Negative for dysuria and difficulty urinating.   Musculoskeletal: Negative for back pain and neck pain.  Skin: Negative for rash.  Neurological: Negative for syncope and headaches.      Allergies  Review of patient's allergies indicates no known allergies.  Home Medications   Prior to Admission medications   Medication Sig Start Date End Date Taking? Authorizing Provider  albuterol (PROVENTIL HFA;VENTOLIN HFA) 108 (90 BASE) MCG/ACT inhaler Inhale 1-2 puffs into the lungs every 6 (six) hours as needed for wheezing or shortness of breath. 09/05/13  Yes Delbert PhenixLinda M Barefoot, NP  doxycycline (VIBRAMYCIN) 100 MG capsule Take 1 capsule (100 mg total) by mouth 2 (two) times daily. 10/24/15 11/07/15  Alvira MondayErin Deshon Hsiao, MD  metroNIDAZOLE (FLAGYL) 500 MG tablet Take 1 tablet (500 mg total) by mouth 2 (two) times daily. 10/24/15   Alvira MondayErin Wynne Rozak, MD  omeprazole (PRILOSEC) 20 MG capsule Take 1 capsule (20 mg total) by mouth daily. Patient not taking: Reported on 10/24/2015 04/18/14   Santiago GladHeather Laisure, PA-C  Prenatal Vit-Fe Fumarate-FA (PRENATAL MULTIVITAMIN) TABS tablet Take 1 tablet by mouth daily at 12 noon. Patient not taking: Reported on 10/24/2015 11/08/13   Aviva SignsMarie L Williams, CNM   BP 109/64 mmHg  Pulse 73  Temp(Src) 98.8 F (37.1 C) (Oral)  Resp 30  Ht 5\' 4"  (1.626 m)  Wt 110 lb (49.896 kg)  BMI 18.87 kg/m2  SpO2 96%  LMP 10/16/2015 Physical Exam  Constitutional: She is oriented to person, place, and time. She appears well-developed and well-nourished. No distress.  HENT:  Head: Normocephalic and atraumatic.  Eyes: Conjunctivae and EOM are normal.  Neck: Normal range of motion.  Cardiovascular: Normal rate, regular rhythm, normal heart sounds and intact distal pulses.  Exam reveals no gallop and no friction rub.   No murmur heard. Pulmonary/Chest: Effort normal and breath sounds normal. No respiratory distress. She has no wheezes. She has no rales.  Abdominal: Soft. She exhibits no distension. Tenderness: suprapubic. There is no guarding.   Genitourinary: There is no rash on the right labia. There is no rash on the left labia. Uterus is tender. Cervix exhibits motion tenderness. There is bleeding (trace) in the vagina. No vaginal discharge found.  Musculoskeletal: She exhibits no edema or tenderness.  Neurological: She is alert and oriented to person, place, and time.  Skin: Skin is warm and dry. No rash noted. She is not diaphoretic. No erythema.  Nursing note and vitals reviewed.   ED Course  Procedures (including critical care time) Labs Review Labs Reviewed  WET PREP, GENITAL - Abnormal; Notable for the following:    WBC, Wet Prep HPF POC FEW (*)    All other components within normal limits  COMPREHENSIVE METABOLIC PANEL - Abnormal; Notable for the following:    ALT 12 (*)    All other components within normal limits  LIPASE, BLOOD  CBC  URINALYSIS, ROUTINE W REFLEX MICROSCOPIC (NOT AT [facility])  TROPONIN I  HIV ANTIBODY (ROUTINE TESTING)  RPR  I-STAT BETA HCG BLOOD, ED ([facility], [facility], [facility] ONLY)  I-STAT TROPOININ, ED  GC/CHLAMYDIA PROBE AMP ([place]) NOT AT [facility]    Imaging Review Dg Chest 2 View  10/24/2015  CLINICAL DATA:  Chest pain for 3 days, intermittent. Weakness for 1 month. EXAM: CHEST  2 VIEW COMPARISON:  Chest x-ray dated 05/24/2012. FINDINGS: The heart size and mediastinal contours are within normal limits. Both lungs are clear. The visualized skeletal structures are unremarkable. IMPRESSION: Normal chest x-ray. Electronically Signed   By: Bary Richard M.D.   On: 10/24/2015 14:58   I have personally reviewed and evaluated these images and lab results as part of my medical decision-making.   EKG Interpretation   Date/Time:  Thursday Oct 24 2015 14:43:55 EDT Ventricular Rate:  72 PR Interval:  155 QRS Duration: 85 QT Interval:  376 QTC Calculation: 411 R Axis:   84 Text Interpretation:  Sinus rhythm agree. no acute ischemic changes. no  sig change from old Confirmed by Donnald Garre, MD, Lebron Conners [phone]) on  10/24/2015  2:48:05 PM      MDM   Final diagnoses:  PID (acute pelvic inflammatory disease)  Lower abdominal pain  Other chest pain    24yo female presents with concern for possible tampon left in, chest pain for 3 months, abdominal pain.   Patient reports positive strep test for which she was not treated ,however history is not consistent with rheumatic fever, no murmur, no pulmonary edema, no fever.  Reports CP, no sign of pericarditis, ACS, pneumonia. EKG/trop/CXR without acute findings. Pt PERC negative and doubt PE.  Labs WNL. Reports concern for retained tampon, however exam shows no foreign body. Patient does have pain on bimanual exam, and will treat for PID with doxy and flagyl. Given rocephin/azithro initially in ED. No sign of foreign body and no sign of toxic shock syndrome.  Given duration of symptoms, no RLQ pain, no fever, doubt appendicitis.  No sign  of UTI. Pregnancy negative.  Recommend close PCP follow up.  Patient discharged in stable condition with understanding of reasons to return.     Alvira Monday, MD 10/24/15 2115

## 2015-10-24 NOTE — Discharge Instructions (Signed)

## 2015-10-24 NOTE — ED Notes (Addendum)
Pt reports she got a tampon stuck in her vaginal area a month ago and is unsure if it came out. Had foul smelling discharge the first week that has decreased since then. Pieces of tampon came out at that time. Pt reports feeling weak since then. Has not taken any anti-pyretics today. Pt feels pressure in chest whenever she bends over or exerts herself. Also reports generalized torso pain from neck to hips.

## 2015-10-25 LAB — GC/CHLAMYDIA PROBE AMP (~~LOC~~) NOT AT ARMC
CHLAMYDIA, DNA PROBE: NEGATIVE
NEISSERIA GONORRHEA: NEGATIVE

## 2015-10-25 LAB — HIV ANTIBODY (ROUTINE TESTING W REFLEX): HIV Screen 4th Generation wRfx: NONREACTIVE

## 2015-10-25 LAB — RPR: RPR Ser Ql: NONREACTIVE

## 2016-05-03 ENCOUNTER — Emergency Department (HOSPITAL_COMMUNITY)

## 2016-05-03 ENCOUNTER — Encounter (HOSPITAL_COMMUNITY): Payer: Self-pay

## 2016-05-03 ENCOUNTER — Emergency Department (HOSPITAL_COMMUNITY)
Admission: EM | Admit: 2016-05-03 | Discharge: 2016-05-04 | Disposition: A | Discharge status: Home / Self Care | Attending: Emergency Medicine | Admitting: Emergency Medicine

## 2016-05-03 DIAGNOSIS — Y999 Unspecified external cause status: Secondary | ICD-10-CM | POA: Diagnosis not present

## 2016-05-03 DIAGNOSIS — Z79899 Other long term (current) drug therapy: Secondary | ICD-10-CM | POA: Insufficient documentation

## 2016-05-03 DIAGNOSIS — Y929 Unspecified place or not applicable: Secondary | ICD-10-CM | POA: Diagnosis not present

## 2016-05-03 DIAGNOSIS — G43909 Migraine, unspecified, not intractable, without status migrainosus: Secondary | ICD-10-CM | POA: Insufficient documentation

## 2016-05-03 DIAGNOSIS — W228XXA Striking against or struck by other objects, initial encounter: Secondary | ICD-10-CM | POA: Insufficient documentation

## 2016-05-03 DIAGNOSIS — Y939 Activity, unspecified: Secondary | ICD-10-CM | POA: Insufficient documentation

## 2016-05-03 DIAGNOSIS — Z87891 Personal history of nicotine dependence: Secondary | ICD-10-CM | POA: Insufficient documentation

## 2016-05-03 DIAGNOSIS — S0990XA Unspecified injury of head, initial encounter: Secondary | ICD-10-CM | POA: Diagnosis not present

## 2016-05-03 DIAGNOSIS — J45909 Unspecified asthma, uncomplicated: Secondary | ICD-10-CM | POA: Insufficient documentation
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
## 2016-05-04 MED ORDER — SODIUM CHLORIDE 0.9 % IV BOLUS (SEPSIS)
1000.0000 mL | Freq: Once | INTRAVENOUS | Status: AC
  Administered 2016-05-04: 1000 mL via INTRAVENOUS

## 2016-05-04 MED ORDER — KETOROLAC TROMETHAMINE 30 MG/ML IJ SOLN
30.0000 mg | Freq: Once | INTRAMUSCULAR | Status: AC
  Administered 2016-05-04: 30 mg via INTRAVENOUS
  Filled 2016-05-04: qty 1

## 2016-05-04 MED ORDER — DIPHENHYDRAMINE HCL 50 MG/ML IJ SOLN
25.0000 mg | Freq: Once | INTRAMUSCULAR | Status: AC
  Administered 2016-05-04: 25 mg via INTRAVENOUS
  Filled 2016-05-04: qty 1

## 2016-05-04 MED ORDER — IBUPROFEN 800 MG PO TABS: 800.0000 mg | ORAL_TABLET | Freq: Three times a day (TID) | ORAL | 0 refills | Status: DC | PRN

## 2016-05-04 MED ORDER — PROCHLORPERAZINE EDISYLATE 5 MG/ML IJ SOLN
10.0000 mg | Freq: Once | INTRAMUSCULAR | Status: AC
  Administered 2016-05-04: 10 mg via INTRAVENOUS
  Filled 2016-05-04: qty 2

## 2016-05-04 NOTE — Discharge Instructions (Signed)
Return here as needed.  Follow-up with her primary doctor, increase her fluid intake

## 2016-05-04 NOTE — ED Notes (Signed)
Pt signed at d/c, but signature pad did not capture. Secretary made aware

## 2016-06-13 ENCOUNTER — Encounter (HOSPITAL_COMMUNITY): Payer: Self-pay | Admitting: Emergency Medicine

## 2016-06-13 ENCOUNTER — Emergency Department (HOSPITAL_COMMUNITY)
Admission: EM | Admit: 2016-06-13 | Discharge: 2016-06-13 | Disposition: A | Discharge status: Home / Self Care | Attending: Emergency Medicine | Admitting: Emergency Medicine

## 2016-06-13 DIAGNOSIS — J45909 Unspecified asthma, uncomplicated: Secondary | ICD-10-CM | POA: Diagnosis not present

## 2016-06-13 DIAGNOSIS — R110 Nausea: Secondary | ICD-10-CM | POA: Insufficient documentation

## 2016-06-13 DIAGNOSIS — Z87891 Personal history of nicotine dependence: Secondary | ICD-10-CM | POA: Diagnosis not present

## 2016-06-13 DIAGNOSIS — R6889 Other general symptoms and signs: Secondary | ICD-10-CM

## 2016-06-13 DIAGNOSIS — J029 Acute pharyngitis, unspecified: Secondary | ICD-10-CM | POA: Insufficient documentation

## 2016-06-13 DIAGNOSIS — R51 Headache: Secondary | ICD-10-CM | POA: Diagnosis present

## 2016-06-13 LAB — INFLUENZA PANEL BY PCR (TYPE A & B)
INFLAPCR: NEGATIVE
Influenza B By PCR: NEGATIVE

## 2016-06-13 LAB — RAPID STREP SCREEN (MED CTR MEBANE ONLY): Streptococcus, Group A Screen (Direct): NEGATIVE

## 2016-06-13 MED ORDER — SODIUM CHLORIDE 0.9 % IV SOLN
10.0000 mg | Freq: Once | INTRAVENOUS | Status: DC
  Filled 2016-06-13: qty 1

## 2016-06-13 MED ORDER — ONDANSETRON 4 MG PO TBDP: 4.0000 mg | ORAL_TABLET | Freq: Three times a day (TID) | ORAL | 0 refills | Status: DC | PRN

## 2016-06-13 MED ORDER — DEXAMETHASONE 1 MG/ML PO CONC: 10.0000 mg | Freq: Once | ORAL | Status: DC

## 2016-06-13 MED ORDER — DEXAMETHASONE 10 MG/ML FOR PEDIATRIC ORAL USE
INTRAMUSCULAR | Status: AC
  Administered 2016-06-13: 10 mg
  Filled 2016-06-13: qty 1

## 2016-06-13 MED ORDER — DEXAMETHASONE 1 MG/ML PO CONC
10.0000 mg | Freq: Once | ORAL | Status: DC
  Filled 2016-06-13: qty 10

## 2016-06-13 NOTE — ED Triage Notes (Signed)
Pt complaint of nausea, sore throat, and headache onset a week ago. Pt denies abdominal pain, emesis, or GU symptoms.

## 2016-06-13 NOTE — Discharge Instructions (Signed)
Please drink plenty of fluids.  Take Tylenol or Motrin for pain/fever Take Zofran for nausea You can check results of flu test on MyChart Return to the ED for worsening symptoms

## 2016-06-13 NOTE — ED Provider Notes (Signed)
[facility]-EMERGENCY DEPT Provider Note   CSN: [id number] Arrival date & time: 06/13/16  1039  By signing my name below, I, Vista Minkobert Ross, attest that this documentation has been prepared under the direction and in the presence of YahooKelly Elan Brainerd PA-C.  Electronically Signed: Vista Minkobert Ross, ED Scribe. 06/13/16. 12:19 PM.   History   Chief Complaint Chief Complaint  Patient presents with  . Sore Throat  . Nausea  . Headache    HPI HPI Comments: Melanie Galloway is a 26 y.o. female, with Hx of chronic headaches, who presents to the Emergency Department complaining of constant generalized abdominal pain with associated nausea, sore throat, headache, ear pain, that started approximately one week ago. Pt also reports intermittent diarrhea for the past two weeks. Pt describes her abdominal pain as sharp and stabbing. She has felt nauseas intermittently but denies vomiting. Pt reports exacerbation of sore throat when swallowing. She has not taken any OTC medications at home. Pt does states that she has a prescription for ibuprofen from a previous ED visit in November that she did not fill.  She states that her mom has the flu and her sister has walking pneumonia. Pt is from [place] and is seeing her 292 y/o son soon. [place] next two months. No vomiting. No fever.  The history is provided by the patient. No language interpreter was used.    Past Medical History:  Diagnosis Date  . Asthma   . Migraines   . Stomach ulcer     Patient Active Problem List   Diagnosis Date Noted  . SVD (spontaneous vaginal delivery) 12/08/2013  . Pregnancy 12/07/2013  . Chronic migraine 09/05/2013  . Anxiety 09/05/2013  . Depression 09/05/2013  . Chronic headache 07/04/2013  . Abnormal maternal serum screening test 06/13/2013  . UTI (urinary tract infection) in pregnancy in first trimester 06/06/2013  . Supervision of normal first pregnancy 05/09/2013  . Asthma complicating pregnancy,  antepartum 05/09/2013    Past Surgical History:  Procedure Laterality Date  . EXTERNAL EAR SURGERY    . EYE MUSCLE SURGERY      OB History    Gravida Para Term Preterm AB Living   1 1 1     1    SAB TAB Ectopic Multiple Live Births           1       Home Medications    Prior to Admission medications   Medication Sig Start Date End Date Taking? Authorizing Provider  acetaminophen (TYLENOL) 500 MG tablet Take 1,000 mg by mouth every 6 (six) hours as needed for moderate pain.    Historical Provider, MD  albuterol (PROVENTIL HFA;VENTOLIN HFA) 108 (90 BASE) MCG/ACT inhaler Inhale 1-2 puffs into the lungs every 6 (six) hours as needed for wheezing or shortness of breath. Patient not taking: Reported on 05/04/2016 09/05/13   Delbert PhenixLinda M Barefoot, NP  cetirizine (ZYRTEC) 10 MG tablet Take 10 mg by mouth daily.    Historical Provider, MD  ibuprofen (ADVIL,MOTRIN) 800 MG tablet Take 1 tablet (800 mg total) by mouth every 8 (eight) hours as needed. 05/04/16   Christopher Lawyer, PA-C  metroNIDAZOLE (FLAGYL) 500 MG tablet Take 1 tablet (500 mg total) by mouth 2 (two) times daily. Patient not taking: Reported on 05/04/2016 10/24/15   Alvira MondayErin Schlossman, MD  omeprazole (PRILOSEC) 20 MG capsule Take 1 capsule (20 mg total) by mouth daily. Patient not taking: Reported on 10/24/2015 04/18/14   Santiago GladHeather Laisure,  PA-C  Prenatal Vit-Fe Fumarate-FA (PRENATAL MULTIVITAMIN) TABS tablet Take 1 tablet by mouth daily at 12 noon. Patient not taking: Reported on 10/24/2015 11/08/13   Aviva Signs, CNM    Family History Family History  Problem Relation Age of Onset  . Cancer Mother   . Asthma Sister     Social History Social History  Substance Use Topics  . Smoking status: Former Smoker    Types: Cigarettes    Quit date: 04/26/2013  . Smokeless tobacco: Never Used  . Alcohol use No   Allergies   Patient has no known allergies.  Review of Systems Review of Systems  Constitutional: Negative for fever.    HENT: Positive for ear pain and sore throat.   Gastrointestinal: Positive for abdominal pain, diarrhea and nausea. Negative for vomiting.  Neurological: Positive for headaches.   Physical Exam Updated Vital Signs BP 117/66 (BP Location: Left Arm)   Pulse 108   Temp 98.7 F (37.1 C) (Oral)   Resp 14   Ht 5\' 4"  (1.626 m)   Wt 123 lb (55.8 kg)   LMP 06/04/2016   SpO2 98%   BMI 21.11 kg/m   Physical Exam  Constitutional: She is oriented to person, place, and time. She appears well-developed and well-nourished. No distress.  HENT:  Head: Normocephalic and atraumatic.  Right Ear: Hearing, tympanic membrane, external ear and ear canal normal.  Left Ear: Hearing, tympanic membrane, external ear and ear canal normal.  Nose: Mucosal edema present. No rhinorrhea.  Mouth/Throat: Uvula is midline and mucous membranes are normal. Posterior oropharyngeal erythema (mild) present.  Neck: Normal range of motion.  Cardiovascular: Normal rate and regular rhythm.  Exam reveals no gallop and no friction rub.   No murmur heard. Pulmonary/Chest: Breath sounds normal. No respiratory distress. She has no wheezes. She has no rales. She exhibits no tenderness.  Neurological: She is alert and oriented to person, place, and time.  Skin: Skin is warm and dry. She is not diaphoretic.  Psychiatric: She has a normal mood and affect. Judgment normal.  Nursing note and vitals reviewed.   ED Treatments / Results  DIAGNOSTIC STUDIES: Oxygen Saturation is 98% on RA, normal by my interpretation.  COORDINATION OF CARE: 12:17 AM-Discussed treatment plan with pt at bedside and pt agreed to plan.   Labs (all labs ordered are listed, but only abnormal results are displayed) Labs Reviewed  RAPID STREP SCREEN (NOT AT [facility])  CULTURE, GROUP A STREP [facility])  INFLUENZA PANEL BY PCR (TYPE A & B, H1N1)    EKG  EKG Interpretation None       Radiology No results found.  Procedures Procedures (including  critical care time)  Medications Ordered in ED Medications  dexamethasone (DECADRON) 10 MG/ML injection for Pediatric ORAL use (10 mg  Given 06/13/16 1227)     Initial Impression / Assessment and Plan / ED Course  I have reviewed the triage vital signs and the nursing notes.  Pertinent labs & imaging results that were available during my care of the patient were reviewed by me and considered in my medical decision making (see chart for details).  Clinical Course    26 year old female presents with URI symptoms. Rapid strep is negative. She is initially tachycardic but otherwise vitals are normal. Tachycardia improved on recheck. She is tolerating PO. Flu test sent per pt request. Advised symptomatic care. Patient is NAD, non-toxic, with stable VS. Patient is informed of clinical course, understands medical decision making process,  and agrees with plan. Opportunity for questions provided and all questions answered. Return precautions given.   Final Clinical Impressions(s) / ED Diagnoses   Final diagnoses:  Flu-like symptoms    New Prescriptions Discharge Medication List as of 06/13/2016 12:09 PM    START taking these medications   Details  ondansetron (ZOFRAN ODT) 4 MG disintegrating tablet Take 1 tablet (4 mg total) by mouth every 8 (eight) hours as needed for nausea or vomiting., Starting Sat 06/13/2016, Print       I personally performed the services described in this documentation, which was scribed in my presence. The recorded information has been reviewed and is accurate.     Bethel Born, PA-C 06/14/16 1718    Lorre Nick, MD 06/16/16 [phone]

## 2016-06-16 LAB — CULTURE, GROUP A STREP (THRC)

## 2016-12-21 ENCOUNTER — Emergency Department (HOSPITAL_COMMUNITY)

## 2016-12-21 ENCOUNTER — Encounter (HOSPITAL_COMMUNITY): Payer: Self-pay | Admitting: Oncology

## 2016-12-21 ENCOUNTER — Emergency Department (HOSPITAL_COMMUNITY)
Admission: EM | Admit: 2016-12-21 | Discharge: 2016-12-21 | Disposition: A | Discharge status: Home / Self Care | Attending: Emergency Medicine | Admitting: Emergency Medicine

## 2016-12-21 DIAGNOSIS — F17210 Nicotine dependence, cigarettes, uncomplicated: Secondary | ICD-10-CM | POA: Diagnosis not present

## 2016-12-21 DIAGNOSIS — T148XXA Other injury of unspecified body region, initial encounter: Secondary | ICD-10-CM | POA: Insufficient documentation

## 2016-12-21 DIAGNOSIS — R0789 Other chest pain: Secondary | ICD-10-CM

## 2016-12-21 DIAGNOSIS — S0990XA Unspecified injury of head, initial encounter: Secondary | ICD-10-CM | POA: Diagnosis present

## 2016-12-21 DIAGNOSIS — Y939 Activity, unspecified: Secondary | ICD-10-CM | POA: Insufficient documentation

## 2016-12-21 DIAGNOSIS — Y929 Unspecified place or not applicable: Secondary | ICD-10-CM | POA: Insufficient documentation

## 2016-12-21 DIAGNOSIS — Y999 Unspecified external cause status: Secondary | ICD-10-CM | POA: Insufficient documentation

## 2016-12-21 DIAGNOSIS — S0083XA Contusion of other part of head, initial encounter: Secondary | ICD-10-CM

## 2016-12-21 DIAGNOSIS — J45909 Unspecified asthma, uncomplicated: Secondary | ICD-10-CM | POA: Insufficient documentation

## 2016-12-21 LAB — I-STAT BETA HCG BLOOD, ED (MC, WL, AP ONLY)

## 2016-12-21 MED ORDER — HYDROCODONE-ACETAMINOPHEN 5-325 MG PO TABS
1.0000 | ORAL_TABLET | Freq: Once | ORAL | Status: AC
  Administered 2016-12-21: 1 via ORAL
  Filled 2016-12-21: qty 1

## 2016-12-21 MED ORDER — HYDROCODONE-ACETAMINOPHEN 5-325 MG PO TABS: 1.0000 | ORAL_TABLET | ORAL | 0 refills | Status: DC | PRN

## 2016-12-21 MED ORDER — IBUPROFEN 600 MG PO TABS: 600.0000 mg | ORAL_TABLET | Freq: Four times a day (QID) | ORAL | 0 refills | Status: DC | PRN
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
## 2017-08-16 ENCOUNTER — Encounter (HOSPITAL_COMMUNITY): Payer: Self-pay

## 2017-08-16 DIAGNOSIS — F17210 Nicotine dependence, cigarettes, uncomplicated: Secondary | ICD-10-CM | POA: Insufficient documentation

## 2017-08-16 DIAGNOSIS — J45909 Unspecified asthma, uncomplicated: Secondary | ICD-10-CM | POA: Insufficient documentation

## 2017-08-16 DIAGNOSIS — R102 Pelvic and perineal pain: Secondary | ICD-10-CM | POA: Insufficient documentation

## 2017-08-16 DIAGNOSIS — N939 Abnormal uterine and vaginal bleeding, unspecified: Secondary | ICD-10-CM | POA: Insufficient documentation

## 2017-08-16 DIAGNOSIS — N898 Other specified noninflammatory disorders of vagina: Secondary | ICD-10-CM | POA: Insufficient documentation

## 2017-08-16 LAB — I-STAT BETA HCG BLOOD, ED (MC, WL, AP ONLY)

## 2017-08-16 NOTE — ED Triage Notes (Signed)
Pt presents with c/o vaginal spotting that started yesterday. Pt reports that she was supposed to start her period on 2/26 but it never came. Pt reports there is a possibility that she might be pregnant.

## 2017-08-17 ENCOUNTER — Emergency Department (HOSPITAL_COMMUNITY): Payer: Self-pay

## 2017-08-17 ENCOUNTER — Emergency Department (HOSPITAL_COMMUNITY)
Admission: EM | Admit: 2017-08-17 | Discharge: 2017-08-17 | Disposition: A | Discharge status: Home / Self Care | Payer: Self-pay | Attending: Emergency Medicine | Admitting: Emergency Medicine

## 2017-08-17 DIAGNOSIS — R102 Pelvic and perineal pain: Secondary | ICD-10-CM

## 2017-08-17 DIAGNOSIS — N939 Abnormal uterine and vaginal bleeding, unspecified: Secondary | ICD-10-CM

## 2017-08-17 DIAGNOSIS — N898 Other specified noninflammatory disorders of vagina: Secondary | ICD-10-CM

## 2017-08-17 LAB — COMPREHENSIVE METABOLIC PANEL
ALK PHOS: 40 U/L (ref 38–126)
ALT: 14 U/L (ref 14–54)
AST: 17 U/L (ref 15–41)
Albumin: 4.3 g/dL (ref 3.5–5.0)
Anion gap: 8 (ref 5–15)
BILIRUBIN TOTAL: 0.3 mg/dL (ref 0.3–1.2)
BUN: 10 mg/dL (ref 6–20)
CALCIUM: 9.3 mg/dL (ref 8.9–10.3)
CHLORIDE: 103 mmol/L (ref 101–111)
CO2: 27 mmol/L (ref 22–32)
CREATININE: 0.86 mg/dL (ref 0.44–1.00)
Glucose, Bld: 96 mg/dL (ref 65–99)
Potassium: 3.6 mmol/L (ref 3.5–5.1)
Sodium: 138 mmol/L (ref 135–145)
Total Protein: 7.1 g/dL (ref 6.5–8.1)

## 2017-08-17 LAB — CBC WITH DIFFERENTIAL/PLATELET
BASOS PCT: 0 %
Basophils Absolute: 0 10*3/uL (ref 0.0–0.1)
EOS ABS: 0.1 10*3/uL (ref 0.0–0.7)
EOS PCT: 2 %
HCT: 40.9 % (ref 36.0–46.0)
Hemoglobin: 13.9 g/dL (ref 12.0–15.0)
Lymphocytes Relative: 50 %
Lymphs Abs: 3.3 10*3/uL (ref 0.7–4.0)
MCH: 31.5 pg (ref 26.0–34.0)
MCHC: 34 g/dL (ref 30.0–36.0)
MCV: 92.7 fL (ref 78.0–100.0)
Monocytes Absolute: 0.4 10*3/uL (ref 0.1–1.0)
Monocytes Relative: 5 %
Neutro Abs: 2.9 10*3/uL (ref 1.7–7.7)
Neutrophils Relative %: 43 %
PLATELETS: 239 10*3/uL (ref 150–400)
RBC: 4.41 MIL/uL (ref 3.87–5.11)
RDW: 12.6 % (ref 11.5–15.5)
WBC: 6.7 10*3/uL (ref 4.0–10.5)

## 2017-08-17 LAB — WET PREP, GENITAL
Clue Cells Wet Prep HPF POC: NONE SEEN
Sperm: NONE SEEN
TRICH WET PREP: NONE SEEN
YEAST WET PREP: NONE SEEN

## 2017-08-17 LAB — URINALYSIS, ROUTINE W REFLEX MICROSCOPIC
BILIRUBIN URINE: NEGATIVE
Glucose, UA: NEGATIVE mg/dL
KETONES UR: NEGATIVE mg/dL
LEUKOCYTES UA: NEGATIVE
NITRITE: NEGATIVE
PROTEIN: NEGATIVE mg/dL
SQUAMOUS EPITHELIAL / LPF: NONE SEEN
Specific Gravity, Urine: 1.015 (ref 1.005–1.030)
pH: 6 (ref 5.0–8.0)

## 2017-08-17 MED ORDER — DOXYCYCLINE HYCLATE 100 MG PO CAPS: 100.0000 mg | ORAL_CAPSULE | Freq: Two times a day (BID) | ORAL | 0 refills | Status: DC

## 2017-08-17 MED ORDER — ONDANSETRON HCL 4 MG/2ML IJ SOLN
4.0000 mg | Freq: Once | INTRAMUSCULAR | Status: DC
  Filled 2017-08-17: qty 2

## 2017-08-17 MED ORDER — MORPHINE SULFATE (PF) 4 MG/ML IV SOLN: 4.0000 mg | Freq: Once | INTRAVENOUS | Status: DC

## 2017-08-17 MED ORDER — CEFTRIAXONE SODIUM 250 MG IJ SOLR
250.0000 mg | Freq: Once | INTRAMUSCULAR | Status: AC
  Administered 2017-08-17: 250 mg via INTRAMUSCULAR
  Filled 2017-08-17: qty 250

## 2017-08-17 MED ORDER — LIDOCAINE-EPINEPHRINE (PF) 2 %-1:200000 IJ SOLN
INTRAMUSCULAR | Status: AC
  Administered 2017-08-17: 20 mL
  Filled 2017-08-17: qty 20

## 2017-08-17 MED ORDER — SODIUM CHLORIDE 0.9 % IV BOLUS (SEPSIS)
500.0000 mL | Freq: Once | INTRAVENOUS | Status: AC
Start: 2017-08-17 — End: 2017-08-17
  Administered 2017-08-17: 500 mL via INTRAVENOUS

## 2017-08-17 NOTE — ED Provider Notes (Signed)
[facility]-EMERGENCY DEPT Provider Note   CSN: [id number] Arrival date & time: 08/16/17  2045     History   Chief Complaint Chief Complaint  Patient presents with  . Vaginal Bleeding  . Possible Pregnancy    HPI Melanie Galloway is a 27 y.o. female.  HPI 27 year old with no pertinent past medical history presents to the emergency department today for evaluation of pelvic pain and vaginal bleeding.  Patient states that yesterday she started to develop some spotting.  States that she now is having some dark brown to black discharge from her vagina.  She denies any foul-smelling odor.  Patient also reports some left-sided pelvic pain.  She also reports some intermittent nausea however states the nausea has been ongoing for many years.  The patient reports that her last period was in February.  She did miss her menstrual cycle on 2/26.  There is a possible that she may be pregnant.  However when I asked her if she is sexually active she states that in several months.  But then reports that there is a possibility she could be pregnant.  She reports the pain as a cramping pain.  Nothing makes better or worse.  She denies any urinary symptoms, change in bowel habits, vomiting, fevers.  She has not taken anything for symptoms prior to arrival.  Nothing makes better or worse.  Pt denies any fever, chill, ha, vision changes, lightheadedness, dizziness, congestion, neck pain, cp, sob, cough, urinary symptoms, change in bowel habits, melena, hematochezia, lower extremity paresthesias.  Past Medical History:  Diagnosis Date  . Asthma   . Migraines   . Stomach ulcer     Patient Active Problem List   Diagnosis Date Noted  . SVD (spontaneous vaginal delivery) 12/08/2013  . Anxiety 09/05/2013  . Depression 09/05/2013  . Chronic headache 07/04/2013  . Abnormal maternal serum screening test 06/13/2013  . UTI (urinary tract infection) in pregnancy in first trimester  06/06/2013  . Supervision of normal first pregnancy 05/09/2013  . Asthma complicating pregnancy, antepartum 05/09/2013    Past Surgical History:  Procedure Laterality Date  . EXTERNAL EAR SURGERY    . EYE MUSCLE SURGERY      OB History    Gravida Para Term Preterm AB Living   1 1 1     1    SAB TAB Ectopic Multiple Live Births           1       Home Medications    Prior to Admission medications   Medication Sig Start Date End Date Taking? Authorizing Provider  acetaminophen (TYLENOL) 500 MG tablet Take 1,000 mg by mouth every 6 (six) hours as needed for moderate pain.   Yes [provider]  HYDROcodone-acetaminophen (NORCO/VICODIN) 5-325 MG tablet Take 1 tablet by mouth every 4 (four) hours as needed. Patient not taking: Reported on 08/17/2017 12/21/16   Elpidio Anis, PA-C  ibuprofen (ADVIL,MOTRIN) 600 MG tablet Take 1 tablet (600 mg total) by mouth every 6 (six) hours as needed. Patient not taking: Reported on 08/17/2017 12/21/16   Elpidio Anis, PA-C    Family History Family History  Problem Relation Age of Onset  . Cancer Mother   . Asthma Sister     Social History Social History   Tobacco Use  . Smoking status: Current Every Day Smoker    Packs/day: 1.00    Types: Cigarettes    Last attempt to quit: 04/26/2013    Years since quitting:  4.3  . Smokeless tobacco: Never Used  Substance Use Topics  . Alcohol use: Yes  . Drug use: No    Comment: quit with pregnancy     Allergies   Patient has no known allergies.   Review of Systems Review of Systems  All other systems reviewed and are negative.    Physical Exam Updated Vital Signs BP 106/65   Pulse 72   Temp 98.3 F (36.8 C) (Oral)   Resp 14   Ht 5\' 4"  (1.626 m)   Wt 52.2 kg (115 lb)   LMP 07/01/2017 (Approximate)   SpO2 99%   BMI 19.74 kg/m   Physical Exam  Constitutional: She is oriented to person, place, and time. She appears well-developed and well-nourished.  Non-toxic  appearance. No distress.  HENT:  Head: Normocephalic and atraumatic.  Nose: Nose normal.  Mouth/Throat: Oropharynx is clear and moist.  Eyes: Conjunctivae are normal. Pupils are equal, round, and reactive to light. Right eye exhibits no discharge. Left eye exhibits no discharge.  Neck: Normal range of motion. Neck supple.  Cardiovascular: Normal rate, regular rhythm, normal heart sounds and intact distal pulses.  Pulmonary/Chest: Effort normal and breath sounds normal. No respiratory distress. She exhibits no tenderness.  Abdominal: Soft. Bowel sounds are normal. There is tenderness (more left lower pelvic region) in the left lower quadrant. There is no rigidity, no rebound, no guarding, no CVA tenderness, no tenderness at McBurney's point and negative Murphy's sign.  No CVA tenderness.  Genitourinary:  Genitourinary Comments: Chaperone present for exam. No external lesions, swelling, erythema, or rash of the labia. No erythema,  or lesions noted in the vaginal vault.  Patient does have dark brown blood noted in vaginal vault.  No active bleeding noted.  No CMT tenderness, bleeding or friability.  Patient does have some bilateral adnexal tenderness left greater than right.  No inguinal adenopathy or hernia.    Musculoskeletal: Normal range of motion. She exhibits no tenderness.  Lymphadenopathy:    She has no cervical adenopathy.  Neurological: She is alert and oriented to person, place, and time.  Skin: Skin is warm and dry. Capillary refill takes less than 2 seconds.  Psychiatric: Her behavior is normal. Judgment and thought content normal.  Nursing note and vitals reviewed.    ED Treatments / Results  Labs (all labs ordered are listed, but only abnormal results are displayed) Labs Reviewed  WET PREP, GENITAL - Abnormal; Notable for the following components:      Result Value   WBC, Wet Prep HPF POC MANY (*)    All other components within normal limits  COMPREHENSIVE METABOLIC PANEL   CBC WITH DIFFERENTIAL/PLATELET  URINALYSIS, ROUTINE W REFLEX MICROSCOPIC  I-STAT BETA HCG BLOOD, ED ([facility], [facility], [facility] ONLY)  GC/CHLAMYDIA PROBE AMP ([place]) NOT AT [facility]    EKG  EKG Interpretation None       Radiology US Transvaginal Non-ob  Result Date: 08/17/2017 CLINICAL DATA:  27 y/o  F; left pelvic pain and vaginal bleeding. EXAM: TRANSABDOMINAL AND TRANSVAGINAL ULTRASOUND OF PELVIS DOPPLER ULTRASOUND OF OVARIES TECHNIQUE: Both transabdominal and transvaginal ultrasound examinations of the pelvis were performed. Transabdominal technique was performed for global imaging of the pelvis including uterus, ovaries, adnexal regions, and pelvic cul-de-sac. It was necessary to proceed with endovaginal exam following the transabdominal exam to visualize the right ovary. Color and duplex Doppler ultrasound was utilized to evaluate blood flow to the ovaries. COMPARISON:  None. FINDINGS: Uterus Measurements: 9.3 x 4.6 x  5.6 cm. No fibroids or other mass visualized. Endometrium Thickness: 7.6 mm.  No focal abnormality visualized. Right ovary Measurements: 3.5 x 1.9 x 1.6 cm. Normal appearance/no adnexal mass. Left ovary Measurements: 3.6 x 2.6 x 2.4 cm. Simple cyst measuring up to 1.8 cm. Pulsed Doppler evaluation of both ovaries demonstrates normal low-resistance arterial and venous waveforms. Other findings No abnormal free fluid. IMPRESSION: No mass or acute process identified. Unremarkable pelvic ultrasound and Doppler. Electronically Signed   By: Mitzi Hansen M.D.   On: 08/17/2017 06:34   US Pelvis Complete  Result Date: 08/17/2017 CLINICAL DATA:  27 y/o  F; left pelvic pain and vaginal bleeding. EXAM: TRANSABDOMINAL AND TRANSVAGINAL ULTRASOUND OF PELVIS DOPPLER ULTRASOUND OF OVARIES TECHNIQUE: Both transabdominal and transvaginal ultrasound examinations of the pelvis were performed. Transabdominal technique was performed for global imaging of the pelvis including uterus, ovaries, adnexal  regions, and pelvic cul-de-sac. It was necessary to proceed with endovaginal exam following the transabdominal exam to visualize the right ovary. Color and duplex Doppler ultrasound was utilized to evaluate blood flow to the ovaries. COMPARISON:  None. FINDINGS: Uterus Measurements: 9.3 x 4.6 x 5.6 cm. No fibroids or other mass visualized. Endometrium Thickness: 7.6 mm.  No focal abnormality visualized. Right ovary Measurements: 3.5 x 1.9 x 1.6 cm. Normal appearance/no adnexal mass. Left ovary Measurements: 3.6 x 2.6 x 2.4 cm. Simple cyst measuring up to 1.8 cm. Pulsed Doppler evaluation of both ovaries demonstrates normal low-resistance arterial and venous waveforms. Other findings No abnormal free fluid. IMPRESSION: No mass or acute process identified. Unremarkable pelvic ultrasound and Doppler. Electronically Signed   By: Mitzi Hansen M.D.   On: 08/17/2017 06:34   [place] Art/ven Flow Abd Pelv Doppler  Result Date: 08/17/2017 CLINICAL DATA:  27 y/o  F; left pelvic pain and vaginal bleeding. EXAM: TRANSABDOMINAL AND TRANSVAGINAL ULTRASOUND OF PELVIS DOPPLER ULTRASOUND OF OVARIES TECHNIQUE: Both transabdominal and transvaginal ultrasound examinations of the pelvis were performed. Transabdominal technique was performed for global imaging of the pelvis including uterus, ovaries, adnexal regions, and pelvic cul-de-sac. It was necessary to proceed with endovaginal exam following the transabdominal exam to visualize the right ovary. Color and duplex Doppler ultrasound was utilized to evaluate blood flow to the ovaries. COMPARISON:  None. FINDINGS: Uterus Measurements: 9.3 x 4.6 x 5.6 cm. No fibroids or other mass visualized. Endometrium Thickness: 7.6 mm.  No focal abnormality visualized. Right ovary Measurements: 3.5 x 1.9 x 1.6 cm. Normal appearance/no adnexal mass. Left ovary Measurements: 3.6 x 2.6 x 2.4 cm. Simple cyst measuring up to 1.8 cm. Pulsed Doppler evaluation of both ovaries demonstrates normal  low-resistance arterial and venous waveforms. Other findings No abnormal free fluid. IMPRESSION: No mass or acute process identified. Unremarkable pelvic ultrasound and Doppler. Electronically Signed   By: Mitzi Hansen M.D.   On: 08/17/2017 06:34    Procedures Procedures (including critical care time)  Medications Ordered in ED Medications  ondansetron (ZOFRAN) injection 4 mg (not administered)  cefTRIAXone (ROCEPHIN) injection 250 mg (not administered)  sodium chloride 0.9 % bolus 500 mL (500 mLs Intravenous New Bag/Given 08/17/17 0708)     Initial Impression / Assessment and Plan / ED Course  I have reviewed the triage vital signs and the nursing notes.  Pertinent labs & imaging results that were available during my care of the patient were reviewed by me and considered in my medical decision making (see chart for details).     Patient presents to the ED with complaints of left-sided  lower pelvic cramping, nausea, vaginal bleeding.  Patient missed her period in February was concerned that she may be pregnant.  Patient does not have any concern for an STD.  However patient does have a history of PID.  On exam patient has some left lower pelvic pain to palpation.  No focal abdominal tenderness or signs of peritonitis.  No CVA tenderness.  Pelvic exam shows dried blood noted the vaginal vault with no active bleeding.  She also has bilateral adnexal tenderness.  Lab work reassuring.  No leukocytosis.  Normal kidney function.  Pregnancy test was negative.  Her wet prep showed many WBCs but no other acute abnormalities.  A GC chlamydia pending at this time.  UA is pending.  Patient given fluids and Zofran.  She refused any pain medications.  Ultrasound was obtained that showed no acute abnormalities.  I do not feel that further CT imaging is indicated at this time as patient has no urinary symptoms.  Low suspicion for UTI, pyelonephritis, ureteral stone.  Unknown etiology of  patient's bleeding.  No active bleeding at this time.  No signs of significant blood loss with normal hemoglobin, BUN, creatinine and patient is not tachycardic or hypotensive.  Pregnancy test was negative.  No signs of ectopic pregnancy.  Patient will need close follow-up with her OB/GYN doctor.  Given patient adnexal tenderness with WBC of the wet prep and history of PID will treat patient with Rocephin and 14 days of doxycycline for PID.  Patient's disposition is pending UA at this time.  Care handoff to PA OktahaSofia. Pt has pending at this time ua and disp.  Disposition likely home pending lab and test results. Care dicussed and plan agreed upon with oncoming PA. Pt updated on plan of care and is currently hemodynamically stable at this time with normal vs.     Final Clinical Impressions(s) / ED Diagnoses   Final diagnoses:  Abnormal vaginal bleeding  Pelvic pain  Vaginal discharge    ED Discharge Orders    None       Wallace KellerLeaphart, Martavia Tye T, PA-C 08/17/17 0743    Dione BoozeGlick, David, MD 08/17/17 [phone]

## 2017-08-17 NOTE — ED Notes (Signed)
PT have been made aware of urine sample will info staff when able to provide  

## 2017-08-18 LAB — GC/CHLAMYDIA PROBE AMP (~~LOC~~) NOT AT ARMC
Chlamydia: NEGATIVE
Neisseria Gonorrhea: NEGATIVE

## 2017-10-14 ENCOUNTER — Emergency Department (HOSPITAL_COMMUNITY): Admission: EM | Admit: 2017-10-14 | Discharge: 2017-10-14 | Discharge status: Absent without leave | Payer: Self-pay

## 2017-12-07 ENCOUNTER — Encounter (HOSPITAL_COMMUNITY): Payer: Self-pay | Admitting: *Deleted

## 2017-12-07 ENCOUNTER — Other Ambulatory Visit: Payer: Self-pay

## 2017-12-07 ENCOUNTER — Inpatient Hospital Stay (HOSPITAL_COMMUNITY)
Admission: AD | Admit: 2017-12-07 | Discharge: 2017-12-07 | Disposition: A | Discharge status: Home / Self Care | Source: Ambulatory Visit | Attending: Obstetrics and Gynecology | Admitting: Obstetrics and Gynecology

## 2017-12-07 DIAGNOSIS — N911 Secondary amenorrhea: Secondary | ICD-10-CM | POA: Insufficient documentation

## 2017-12-07 DIAGNOSIS — R1032 Left lower quadrant pain: Secondary | ICD-10-CM

## 2017-12-07 DIAGNOSIS — R1012 Left upper quadrant pain: Secondary | ICD-10-CM

## 2017-12-07 DIAGNOSIS — Z8742 Personal history of other diseases of the female genital tract: Secondary | ICD-10-CM

## 2017-12-07 DIAGNOSIS — Z87891 Personal history of nicotine dependence: Secondary | ICD-10-CM | POA: Insufficient documentation

## 2017-12-07 DIAGNOSIS — R112 Nausea with vomiting, unspecified: Secondary | ICD-10-CM | POA: Diagnosis not present

## 2017-12-07 LAB — URINALYSIS, ROUTINE W REFLEX MICROSCOPIC
Bilirubin Urine: NEGATIVE
GLUCOSE, UA: NEGATIVE mg/dL
Hgb urine dipstick: NEGATIVE
Ketones, ur: NEGATIVE mg/dL
Leukocytes, UA: NEGATIVE
NITRITE: NEGATIVE
PH: 7 (ref 5.0–8.0)
PROTEIN: NEGATIVE mg/dL
Specific Gravity, Urine: 1.009 (ref 1.005–1.030)

## 2017-12-07 LAB — COMPREHENSIVE METABOLIC PANEL
ALBUMIN: 4.3 g/dL (ref 3.5–5.0)
ALT: 14 U/L (ref 0–44)
AST: 18 U/L (ref 15–41)
Alkaline Phosphatase: 38 U/L (ref 38–126)
Anion gap: 7 (ref 5–15)
BUN: 11 mg/dL (ref 6–20)
CHLORIDE: 107 mmol/L (ref 98–111)
CO2: 25 mmol/L (ref 22–32)
Calcium: 9.1 mg/dL (ref 8.9–10.3)
Creatinine, Ser: 0.78 mg/dL (ref 0.44–1.00)
GFR calc Af Amer: >60 mL/min (ref >60)
GLUCOSE: 92 mg/dL (ref 70–99)
POTASSIUM: 3.8 mmol/L (ref 3.5–5.1)
Sodium: 139 mmol/L (ref 135–145)
Total Bilirubin: 0.5 mg/dL (ref 0.3–1.2)
Total Protein: 6.8 g/dL (ref 6.5–8.1)

## 2017-12-07 LAB — CBC WITH DIFFERENTIAL/PLATELET
BASOS PCT: 0 %
Basophils Absolute: 0 10*3/uL (ref 0.0–0.1)
EOS ABS: 0.1 10*3/uL (ref 0.0–0.7)
EOS PCT: 2 %
HCT: 39.4 % (ref 36.0–46.0)
Hemoglobin: 13.6 g/dL (ref 12.0–15.0)
Lymphocytes Relative: 31 %
Lymphs Abs: 2.1 10*3/uL (ref 0.7–4.0)
MCH: 31.4 pg (ref 26.0–34.0)
MCHC: 34.5 g/dL (ref 30.0–36.0)
MCV: 91 fL (ref 78.0–100.0)
MONO ABS: 0.2 10*3/uL (ref 0.1–1.0)
Monocytes Relative: 3 %
Neutro Abs: 4.3 10*3/uL (ref 1.7–7.7)
Neutrophils Relative %: 64 %
PLATELETS: 194 10*3/uL (ref 150–400)
RBC: 4.33 MIL/uL (ref 3.87–5.11)
RDW: 12.8 % (ref 11.5–15.5)
WBC: 6.7 10*3/uL (ref 4.0–10.5)

## 2017-12-07 LAB — WET PREP, GENITAL
CLUE CELLS WET PREP: NONE SEEN
SPERM: NONE SEEN
Trich, Wet Prep: NONE SEEN
Yeast Wet Prep HPF POC: NONE SEEN

## 2017-12-07 LAB — POCT PREGNANCY, URINE: PREG TEST UR: NEGATIVE

## 2017-12-07 LAB — LIPASE, BLOOD: Lipase: 46 U/L (ref 11–51)

## 2017-12-07 LAB — AMYLASE: AMYLASE: 67 U/L (ref 28–100)

## 2017-12-07 MED ORDER — KETOROLAC TROMETHAMINE 60 MG/2ML IM SOLN
60.0000 mg | Freq: Once | INTRAMUSCULAR | Status: AC
  Administered 2017-12-07: 60 mg via INTRAMUSCULAR
  Filled 2017-12-07: qty 2

## 2017-12-07 MED ORDER — ONDANSETRON 8 MG PO TBDP: 8.0000 mg | ORAL_TABLET | Freq: Three times a day (TID) | ORAL | 0 refills | Status: DC | PRN

## 2017-12-07 MED ORDER — ONDANSETRON 8 MG PO TBDP
8.0000 mg | ORAL_TABLET | Freq: Once | ORAL | Status: AC
  Administered 2017-12-07: 8 mg via ORAL
  Filled 2017-12-07: qty 1

## 2017-12-07 MED ORDER — KETOROLAC TROMETHAMINE 10 MG PO TABS: 10.0000 mg | ORAL_TABLET | Freq: Four times a day (QID) | ORAL | 0 refills | Status: DC | PRN

## 2017-12-07 NOTE — Discharge Instructions (Signed)
Nausea and Vomiting, Adult Nausea is the feeling that you have an upset stomach or have to vomit. As nausea gets worse, it can lead to vomiting. Vomiting occurs when stomach contents are thrown up and out of the mouth. Vomiting can make you feel weak and cause you to become dehydrated. Dehydration can make you tired and thirsty, cause you to have a dry mouth, and decrease how often you urinate. Older adults and people with other diseases or a weak immune system are at higher risk for dehydration. It is important to treat your nausea and vomiting as told by your health care provider. Follow these instructions at home: Follow instructions from your health care provider about how to care for yourself at home. Eating and drinking Follow these recommendations as told by your health care provider:  Take an oral rehydration solution (ORS). This is a drink that is sold at pharmacies and retail stores.  Drink clear fluids in small amounts as you are able. Clear fluids include water, ice chips, diluted fruit juice, and low-calorie sports drinks.  Eat bland, easy-to-digest foods in small amounts as you are able. These foods include bananas, applesauce, rice, lean meats, toast, and crackers.  Avoid fluids that contain a lot of sugar or caffeine, such as energy drinks, sports drinks, and soda.  Avoid alcohol.  Avoid spicy or fatty foods.  General instructions  Drink enough fluid to keep your urine clear or pale yellow.  Wash your hands often. If soap and water are not available, use hand sanitizer.  Make sure that all people in your household wash their hands well and often.  Take over-the-counter and prescription medicines only as told by your health care provider.  Rest at home while you recover.  Watch your condition for any changes.  Breathe slowly and deeply when you feel nauseated.  Keep all follow-up visits as told by your health care provider. This is important. Contact a health care  provider if:  You have a fever.  You cannot keep fluids down.  Your symptoms get worse.  You have new symptoms.  Your nausea does not go away after two days.  You feel light-headed or dizzy.  You have a headache.  You have muscle cramps. Get help right away if:  You have pain in your chest, neck, arm, or jaw.  You feel extremely weak or you faint.  You have persistent vomiting.  You see blood in your vomit.  Your vomit looks like black coffee grounds.  You have bloody or black stools or stools that look like tar.  You have a severe headache, a stiff neck, or both.  You have a rash.  You have severe pain, cramping, or bloating in your abdomen.  You have trouble breathing or you are breathing very quickly.  Your heart is beating very quickly.  Your skin feels cold and clammy.  You feel confused.  You have pain when you urinate.  You have signs of dehydration, such as: ? Dark urine, very little urine, or no urine. ? Cracked lips. ? Dry mouth. ? Sunken eyes. ? Sleepiness. ? Weakness. These symptoms may represent a serious problem that is an emergency. Do not wait to see if the symptoms will go away. Get medical help right away. Call your local emergency services (911 in the [place] Do not drive yourself to the hospital. This information is not intended to replace advice given to you by your health care provider. Make sure you discuss any questions you  have with your health care provider. Document Released: 05/25/2005 Document Revised: 10/28/2015 Document Reviewed: 01/29/2015 Elsevier Interactive Patient Education  2018 [employer] Inc[employer]   [place] [facility] Providers    [facility]       Phone: [phone]  [facility] [place] Phone: [phone]  [facility] [place]  Phone: [phone]  [facility]  Phone:  [phone]  [facility] [place]  Phone: [phone]  [facility]       Phone: [phone]  [facility]    Phone: [phone]   [facility]    Phone: [phone]  [facility]    Phone: [phone]  [facility]    Phone: [phone]  [facility]    Phone: [phone]  [facility]       Phone: [phone]   [facility]  Phone: [phone]  [facility]    Phone: [phone]  [facility]   Phone: [phone]  [facility]      Phone: [phone]  [facility]    Phone: [phone]

## 2017-12-07 NOTE — MAU Note (Addendum)
For a couple months, has thought she was preg. preg tests have been negative (last was ~3 wks ago)  Woke up this morning with pain- constant in lower abd, went back to sleep, when got up at 10, pain  In abd was worse; had been in lower abd now upper left ,  Started throwing up, noted blood in emesis.  lmp 5/5

## 2017-12-07 NOTE — MAU Provider Note (Signed)
Chief Complaint: Emesis and Abdominal Pain   First Provider Initiated Contact with Patient 12/07/17 1427     SUBJECTIVE HPI: Arlyss RepressMarissa R Rizor is a 27 y.o. [id number] female who presents to Maternity Admissions reporting nausea, vomiting, LLQ pain x 2 months and severe LLQ pain radiating to LUQ since this morning. LMP 10/10/17. Usually has regular cycles. Neg UPT x 3 including at PCP 3 weeks ago. States she went to  PCP for abd pain, only did UPT and "gave her a shot in the butt" for pain. Pain went away temporarily, but came back.   Was seen in ED 08/2017 for LLQ pain and vaginal bleeding. US showed 1.8 cm simple cyst.   Location: LLW radiating to LUQ Quality: sharp, cramping Severity: 7/10 on pain scale Duration: 2 months of LLQ pain, LUQ pain since today Context: Late menses, going through divorce Timing: intermittent Modifying factors: Temporary relief w/ injection from PCP Associated signs and symptoms: Pos for N/V. Neg for fever, chills, urinary complaints, sick contacts, epigastric pain.   FH Crohn's Dz. Has had multiple colonoscopies in the past for this. No findings C/W Crohn's per pt. Very worried that she has it.  Past Medical History:  Diagnosis Date  . Asthma   . Migraines   . Stomach ulcer    OB History  Gravida Para Term Preterm AB Living  1 1 1     1   SAB TAB Ectopic Multiple Live Births          1    # Outcome Date GA Lbr Len/2nd Weight Sex Delivery Anes PTL Lv  1 Term 12/08/13 [id number] 20:52 / 01:48 8 lb 2.2 oz (3.691 kg) M Vag-Spont EPI  LIV     Birth Comments: moulding   Past Surgical History:  Procedure Laterality Date  . EXTERNAL EAR SURGERY    . EYE MUSCLE SURGERY     Social History   Socioeconomic History  . Marital status: Married    Spouse name: Not on file  . Number of children: Not on file  . Years of education: Not on file  . Highest education level: Not on file  Occupational History  . Not on file  Social Needs  . Financial resource strain: Not  on file  . Food insecurity:    Worry: Not on file    Inability: Not on file  . Transportation needs:    Medical: Not on file    Non-medical: Not on file  Tobacco Use  . Smoking status: Former Smoker    Packs/day: 1.00    Types: Cigarettes    Last attempt to quit: 06/2017    Years since quitting: 0.4  . Smokeless tobacco: Never Used  Substance and Sexual Activity  . Alcohol use: Yes    Comment: Socially  . Drug use: No    Types: Marijuana    Comment: 2014  . Sexual activity: Yes    Birth control/protection: None  Lifestyle  . Physical activity:    Days per week: Not on file    Minutes per session: Not on file  . Stress: Not on file  Relationships  . Social connections:    Talks on phone: Not on file    Gets together: Not on file    Attends religious service: Not on file    Active member of club or organization: Not on file    Attends meetings of clubs or organizations: Not on file    Relationship status: Not on file  .  Intimate partner violence:    Fear of current or ex partner: Not on file    Emotionally abused: Not on file    Physically abused: Not on file    Forced sexual activity: Not on file  Other Topics Concern  . Not on file  Social History Narrative  . Not on file   Family History  Problem Relation Age of Onset  . Cancer Mother   . Crohn's disease Mother   . Asthma Sister    No current facility-administered medications on file prior to encounter.    Current Outpatient Medications on File Prior to Encounter  Medication Sig Dispense Refill  . albuterol (PROVENTIL HFA;VENTOLIN HFA) 108 (90 Base) MCG/ACT inhaler Inhale 1-2 puffs into the lungs every 6 (six) hours as needed for wheezing or shortness of breath.    Marland Kitchen OVER THE COUNTER MEDICATION Take 4 tablets by mouth daily. Balance Vitamins OTC    . doxycycline (VIBRAMYCIN) 100 MG capsule Take 1 capsule (100 mg total) by mouth 2 (two) times daily. (Patient not taking: Reported on 12/07/2017) 28 capsule 0    No Known Allergies  I have reviewed patient's Past Medical Hx, Surgical Hx, Family Hx, Social Hx, medications and allergies.   Review of Systems  Constitutional: Negative for appetite change, chills, fatigue, fever and unexpected weight change.  Gastrointestinal: Positive for abdominal pain, nausea and vomiting. Negative for abdominal distention, anal bleeding, blood in stool, constipation, diarrhea and rectal pain.  Genitourinary: Positive for menstrual problem. Negative for difficulty urinating, dysuria, flank pain, frequency, hematuria, pelvic pain, urgency, vaginal bleeding, vaginal discharge and vaginal pain.  Musculoskeletal: Negative for myalgias.  Neurological: Negative for dizziness.  Psychiatric/Behavioral: The patient is nervous/anxious.     OBJECTIVE Patient Vitals for the past 24 hrs:  BP Temp Temp src Pulse Resp SpO2 Weight  12/07/17 1625 (!) 103/58 98 F (36.7 C) Oral (!) 58 18 100 % -  12/07/17 1624 - 98 F (36.7 C) - - - - -  12/07/17 1308 102/62 98.5 F (36.9 C) Oral 84 16 99 % 105 lb (47.6 kg)   Constitutional: Well-developed, well-nourished, underweight female in no acute distress. Anxious.  Cardiovascular: normal rate Respiratory: normal rate and effort.  GI: Abd soft, Mild LLQ and LUQ tenderness, No masses. Pos BS x 4 MS: Extremities nontender, no edema, normal ROM Neurologic: Alert and oriented x 4.  GU: Neg CVAT.  SPECULUM EXAM: NEFG, physiologic discharge, no blood noted, cervix clean  BIMANUAL: cervix closed; uterus normal size, Mild left adnexal tenderness. No mass. Nml right adnexa. No CMT.  LAB RESULTS Results for orders placed or performed during the hospital encounter of 12/07/17 (from the past 24 hour(s))  Urinalysis, Routine w reflex microscopic     Status: Abnormal   Collection Time: 12/07/17  1:46 PM  Result Value Ref Range   Color, Urine STRAW (A) YELLOW   APPearance CLEAR CLEAR   Specific Gravity, Urine 1.009 1.005 - 1.030   pH 7.0 5.0  - 8.0   Glucose, UA NEGATIVE NEGATIVE mg/dL   Hgb urine dipstick NEGATIVE NEGATIVE   Bilirubin Urine NEGATIVE NEGATIVE   Ketones, ur NEGATIVE NEGATIVE mg/dL   Protein, ur NEGATIVE NEGATIVE mg/dL   Nitrite NEGATIVE NEGATIVE   Leukocytes, UA NEGATIVE NEGATIVE  Pregnancy, urine POC     Status: None   Collection Time: 12/07/17  1:49 PM  Result Value Ref Range   Preg Test, Ur NEGATIVE NEGATIVE  CBC with Differential/Platelet     Status: None  Collection Time: 12/07/17  2:40 PM  Result Value Ref Range   WBC 6.7 4.0 - 10.5 K/uL   RBC 4.33 3.87 - 5.11 MIL/uL   Hemoglobin 13.6 12.0 - 15.0 g/dL   HCT 16.1 09.6 - 04.5 %   MCV 91.0 78.0 - 100.0 fL   MCH 31.4 26.0 - 34.0 pg   MCHC 34.5 30.0 - 36.0 g/dL   RDW 40.9 81.1 - 91.4 %   Platelets 194 150 - 400 K/uL   Neutrophils Relative % 64 %   Neutro Abs 4.3 1.7 - 7.7 K/uL   Lymphocytes Relative 31 %   Lymphs Abs 2.1 0.7 - 4.0 K/uL   Monocytes Relative 3 %   Monocytes Absolute 0.2 0.1 - 1.0 K/uL   Eosinophils Relative 2 %   Eosinophils Absolute 0.1 0.0 - 0.7 K/uL   Basophils Relative 0 %   Basophils Absolute 0.0 0.0 - 0.1 K/uL  Comprehensive metabolic panel     Status: None   Collection Time: 12/07/17  2:40 PM  Result Value Ref Range   Sodium 139 135 - 145 mmol/L   Potassium 3.8 3.5 - 5.1 mmol/L   Chloride 107 98 - 111 mmol/L   CO2 25 22 - 32 mmol/L   Glucose, Bld 92 70 - 99 mg/dL   BUN 11 6 - 20 mg/dL   Creatinine, Ser 7.82 0.44 - 1.00 mg/dL   Calcium 9.1 8.9 - 95.6 mg/dL   Total Protein 6.8 6.5 - 8.1 g/dL   Albumin 4.3 3.5 - 5.0 g/dL   AST 18 15 - 41 U/L   ALT 14 0 - 44 U/L   Alkaline Phosphatase 38 38 - 126 U/L   Total Bilirubin 0.5 0.3 - 1.2 mg/dL   GFR calc non Af Amer >60 >60 mL/min   GFR calc Af Amer >60 >60 mL/min   Anion gap 7 5 - 15  Amylase     Status: None   Collection Time: 12/07/17  2:40 PM  Result Value Ref Range   Amylase 67 28 - 100 U/L  Lipase, blood     Status: None   Collection Time: 12/07/17  2:40 PM   Result Value Ref Range   Lipase 46 11 - 51 U/L  Wet prep, genital     Status: Abnormal   Collection Time: 12/07/17  2:47 PM  Result Value Ref Range   Yeast Wet Prep HPF POC NONE SEEN NONE SEEN   Trich, Wet Prep NONE SEEN NONE SEEN   Clue Cells Wet Prep HPF POC NONE SEEN NONE SEEN   WBC, Wet Prep HPF POC FEW (A) NONE SEEN   Sperm NONE SEEN     IMAGING No results found.  MAU COURSE Orders Placed This Encounter  Procedures  . Wet prep, genital  . US PELVIS (TRANSABDOMINAL ONLY)  . US PELVIS TRANSVANGINAL NON-OB (TV ONLY)  . Urinalysis, Routine w reflex microscopic  . CBC with Differential/Platelet  . Comprehensive metabolic panel  . Amylase  . Lipase, blood  . HIV antibody (routine testing) (NOT for [facility])  . Pregnancy, urine POC  . Discharge patient   Meds ordered this encounter  Medications  . ondansetron (ZOFRAN-ODT) disintegrating tablet 8 mg  . ketorolac (TORADOL) injection 60 mg  . ketorolac (TORADOL) 10 MG tablet    Sig: Take 1 tablet (10 mg total) by mouth every 6 (six) hours as needed for moderate pain.    Dispense:  20 tablet    Refill:  0  Order Specific Question:   Supervising Provider    Answer:   Levie Heritage [[id number]]  . ondansetron (ZOFRAN ODT) 8 MG disintegrating tablet    Sig: Take 1 tablet (8 mg total) by mouth every 8 (eight) hours as needed for nausea or vomiting.    Dispense:  20 tablet    Refill:  0    Order Specific Question:   Supervising Provider    Answer:   Levie Heritage [[id number]]   Pain resolved. No vomiting in MAU.   MDM - Chronic LLQ pain w/ exacerbation today possibly rupture of simple cyst. Low suspicion for torsion, PID or other emergent condition today due to exam, location of pain, absence of leukocytosis and resolution of pain w/Toradol. Rx given for symptom control. Will order pelvic US outpatient. F/U w/ Gyn PRN. List given.  - Chronic N/V of unknown etiology. Will refer back to PCP or GI. Rx Zofran for symptom control.  -  Secondary amenorrhea possible 2./2 stress of divorce, low BMI. Referred to OP Gyn if no menses in next month. Multiple neg UPTs including today in MAU.   ASSESSMENT 1. Left lower quadrant pain   2. History of ovarian cyst   3. Non-intractable vomiting with nausea, unspecified vomiting type   4. Acute abdominal pain in left upper quadrant     PLAN Discharge home in stable condition. Torsion/abd pain precautions Follow-up Information    [facility]. Schedule an appointment as soon as possible for a visit.   Specialty:  Gastroenterology Why:  for nausea  and vomiting Contact information: [address] [place] [address] [phone]       Gynecologist of your choice Follow up.          Allergies as of 12/07/2017   No Known Allergies     Medication List    STOP taking these medications   doxycycline 100 MG capsule Commonly known as:  VIBRAMYCIN     TAKE these medications   albuterol 108 (90 Base) MCG/ACT inhaler Commonly known as:  PROVENTIL HFA;VENTOLIN HFA Inhale 1-2 puffs into the lungs every 6 (six) hours as needed for wheezing or shortness of breath.   ketorolac 10 MG tablet Commonly known as:  TORADOL Take 1 tablet (10 mg total) by mouth every 6 (six) hours as needed for moderate pain.   ondansetron 8 MG disintegrating tablet Commonly known as:  ZOFRAN ODT Take 1 tablet (8 mg total) by mouth every 8 (eight) hours as needed for nausea or vomiting.   OVER THE COUNTER MEDICATION Take 4 tablets by mouth daily. Balance Vitamins OTC        Katrinka Blazing [place], [place] 12/07/2017  4:42 PM

## 2017-12-08 LAB — GC/CHLAMYDIA PROBE AMP (~~LOC~~) NOT AT ARMC
CHLAMYDIA, DNA PROBE: NEGATIVE
Neisseria Gonorrhea: NEGATIVE

## 2017-12-13 ENCOUNTER — Encounter (HOSPITAL_COMMUNITY): Payer: Self-pay

## 2017-12-13 ENCOUNTER — Emergency Department (HOSPITAL_COMMUNITY)
Admission: EM | Admit: 2017-12-13 | Discharge: 2017-12-14 | Disposition: A | Discharge status: Home / Self Care | Payer: Self-pay | Attending: Emergency Medicine | Admitting: Emergency Medicine

## 2017-12-13 ENCOUNTER — Emergency Department (HOSPITAL_COMMUNITY): Payer: Self-pay

## 2017-12-13 ENCOUNTER — Other Ambulatory Visit: Payer: Self-pay

## 2017-12-13 DIAGNOSIS — J45909 Unspecified asthma, uncomplicated: Secondary | ICD-10-CM | POA: Insufficient documentation

## 2017-12-13 DIAGNOSIS — Z79899 Other long term (current) drug therapy: Secondary | ICD-10-CM | POA: Insufficient documentation

## 2017-12-13 DIAGNOSIS — Y929 Unspecified place or not applicable: Secondary | ICD-10-CM | POA: Insufficient documentation

## 2017-12-13 DIAGNOSIS — Y939 Activity, unspecified: Secondary | ICD-10-CM | POA: Insufficient documentation

## 2017-12-13 DIAGNOSIS — Z87891 Personal history of nicotine dependence: Secondary | ICD-10-CM | POA: Insufficient documentation

## 2017-12-13 DIAGNOSIS — S9030XA Contusion of unspecified foot, initial encounter: Secondary | ICD-10-CM

## 2017-12-13 DIAGNOSIS — S9032XA Contusion of left foot, initial encounter: Secondary | ICD-10-CM | POA: Insufficient documentation

## 2017-12-13 DIAGNOSIS — Y999 Unspecified external cause status: Secondary | ICD-10-CM | POA: Insufficient documentation
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
## 2017-12-14 ENCOUNTER — Ambulatory Visit (HOSPITAL_COMMUNITY): Admission: RE | Admit: 2017-12-14 | Source: Ambulatory Visit

## 2017-12-14 ENCOUNTER — Encounter (HOSPITAL_COMMUNITY): Payer: Self-pay | Admitting: Emergency Medicine

## 2017-12-14 ENCOUNTER — Telehealth: Payer: Self-pay | Admitting: Internal Medicine

## 2017-12-14 LAB — HIV 1/2 AB DIFFERENTIATION
HIV 1 AB: NEGATIVE
HIV 2 AB: NEGATIVE
NOTE (HIV CONF MULTISPOT): NEGATIVE

## 2017-12-14 LAB — RNA QUALITATIVE

## 2017-12-14 LAB — HIV ANTIBODY (ROUTINE TESTING W REFLEX): HIV SCREEN 4TH GENERATION: REACTIVE — AB

## 2017-12-14 MED ORDER — IBUPROFEN 800 MG PO TABS: 800.0000 mg | ORAL_TABLET | Freq: Three times a day (TID) | ORAL | 0 refills | Status: DC

## 2017-12-14 MED ORDER — IBUPROFEN 800 MG PO TABS
800.0000 mg | ORAL_TABLET | Freq: Once | ORAL | Status: AC
  Administered 2017-12-14: 800 mg via ORAL
  Filled 2017-12-14: qty 1

## 2017-12-14 MED ORDER — ACETAMINOPHEN 500 MG PO TABS
1000.0000 mg | ORAL_TABLET | Freq: Once | ORAL | Status: AC
  Administered 2017-12-14: 1000 mg via ORAL
  Filled 2017-12-14: qty 2
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
## 2017-12-14 NOTE — Telephone Encounter (Signed)
She has a false positive HIV screening test.  Confirmatory HIV 1 and 2 antibody and viral load are negative.

## 2017-12-15 ENCOUNTER — Ambulatory Visit (HOSPITAL_COMMUNITY)
Admission: RE | Admit: 2017-12-15 | Discharge: 2017-12-15 | Disposition: A | Discharge status: Home / Self Care | Payer: Self-pay | Source: Ambulatory Visit | Attending: Advanced Practice Midwife | Admitting: Advanced Practice Midwife

## 2017-12-15 DIAGNOSIS — Z8742 Personal history of other diseases of the female genital tract: Secondary | ICD-10-CM | POA: Insufficient documentation

## 2017-12-15 DIAGNOSIS — R112 Nausea with vomiting, unspecified: Secondary | ICD-10-CM | POA: Insufficient documentation

## 2017-12-15 DIAGNOSIS — R1032 Left lower quadrant pain: Secondary | ICD-10-CM | POA: Insufficient documentation

## 2017-12-15 DIAGNOSIS — R1012 Left upper quadrant pain: Secondary | ICD-10-CM | POA: Insufficient documentation

## 2017-12-16 ENCOUNTER — Encounter: Payer: Self-pay | Admitting: General Practice

## 2017-12-20 ENCOUNTER — Encounter: Payer: Self-pay | Admitting: Advanced Practice Midwife

## 2017-12-20 DIAGNOSIS — Z789 Other specified health status: Secondary | ICD-10-CM | POA: Insufficient documentation

## 2018-03-24 ENCOUNTER — Inpatient Hospital Stay (HOSPITAL_COMMUNITY): Payer: Self-pay

## 2018-03-24 ENCOUNTER — Encounter (HOSPITAL_COMMUNITY): Payer: Self-pay | Admitting: *Deleted

## 2018-03-24 ENCOUNTER — Inpatient Hospital Stay (HOSPITAL_COMMUNITY)
Admission: AD | Admit: 2018-03-24 | Discharge: 2018-03-24 | Disposition: A | Discharge status: Home / Self Care | Payer: Self-pay | Source: Ambulatory Visit | Attending: Family Medicine | Admitting: Family Medicine

## 2018-03-24 ENCOUNTER — Other Ambulatory Visit: Payer: Self-pay

## 2018-03-24 DIAGNOSIS — O035 Genital tract and pelvic infection following complete or unspecified spontaneous abortion: Secondary | ICD-10-CM | POA: Insufficient documentation

## 2018-03-24 DIAGNOSIS — Z87891 Personal history of nicotine dependence: Secondary | ICD-10-CM | POA: Insufficient documentation

## 2018-03-24 DIAGNOSIS — N939 Abnormal uterine and vaginal bleeding, unspecified: Secondary | ICD-10-CM

## 2018-03-24 LAB — URINALYSIS, ROUTINE W REFLEX MICROSCOPIC
Bilirubin Urine: NEGATIVE
GLUCOSE, UA: NEGATIVE mg/dL
KETONES UR: 5 mg/dL — AB
NITRITE: NEGATIVE
PROTEIN: 30 mg/dL — AB
Specific Gravity, Urine: 1.031 — ABNORMAL HIGH (ref 1.005–1.030)
pH: 5 (ref 5.0–8.0)

## 2018-03-24 LAB — CBC
HCT: 44.8 % (ref 36.0–46.0)
HEMOGLOBIN: 15.4 g/dL — AB (ref 12.0–15.0)
MCH: 31.4 pg (ref 26.0–34.0)
MCHC: 34.4 g/dL (ref 30.0–36.0)
MCV: 91.4 fL (ref 80.0–100.0)
PLATELETS: 270 10*3/uL (ref 150–400)
RBC: 4.9 MIL/uL (ref 3.87–5.11)
RDW: 12.2 % (ref 11.5–15.5)
WBC: 8.6 10*3/uL (ref 4.0–10.5)
nRBC: 0 % (ref 0.0–0.2)

## 2018-03-24 LAB — POCT PREGNANCY, URINE: Preg Test, Ur: NEGATIVE

## 2018-03-24 LAB — WET PREP, GENITAL
Clue Cells Wet Prep HPF POC: NONE SEEN
Sperm: NONE SEEN
Trich, Wet Prep: NONE SEEN
Yeast Wet Prep HPF POC: NONE SEEN

## 2018-03-24 MED ORDER — DOXYCYCLINE HYCLATE 100 MG PO CAPS: 100.0000 mg | ORAL_CAPSULE | Freq: Two times a day (BID) | ORAL | 0 refills | Status: AC

## 2018-03-24 MED ORDER — PROMETHAZINE HCL 25 MG PO TABS: 25.0000 mg | ORAL_TABLET | Freq: Four times a day (QID) | ORAL | 2 refills | Status: DC | PRN

## 2018-03-24 NOTE — MAU Provider Note (Signed)
History     CSN: [id number]  Arrival date and time: 03/24/18 1532   None     Chief Complaint  Patient presents with  . Vaginal Bleeding   HPI 27 year old G2 P1-0-1-1 who is approximately 4 weeks from a miscarriage.  Patient is originally from [place] and had her miscarriage there.  She has had continued bleeding for the past 4 weeks that was pretty heavy until 2-3 days ago.  Proximally 4 days ago, the patient passed fairly large blood clots with some white tissue.  Since that time, the patient has had some light bleeding.  She also complains of nonradiating, intense pelvic pain has been worsening over the past week that she rates 7-8 out of 10.  She attempted to have intercourse and had a lot of pelvic pain.  Complains of cold sweats, chills, hot flashes/fevers at home.   OB History    Gravida  2   Para  1   Term  1   Preterm      AB  1   Living  1     SAB  1   TAB      Ectopic      Multiple      Live Births  1           Past Medical History:  Diagnosis Date  . Asthma   . Migraines   . Stomach ulcer     Past Surgical History:  Procedure Laterality Date  . EXTERNAL EAR SURGERY    . EYE MUSCLE SURGERY      Family History  Problem Relation Age of Onset  . Cancer Mother   . Crohn's disease Mother   . Asthma Sister     Social History   Tobacco Use  . Smoking status: Former Smoker    Packs/day: 1.00    Types: Cigarettes    Last attempt to quit: 06/2017    Years since quitting: 0.7  . Smokeless tobacco: Never Used  Substance Use Topics  . Alcohol use: Yes    Comment: Socially  . Drug use: Not Currently    Types: Marijuana    Comment: 2014    Allergies:  Allergies  Allergen Reactions  . Toradol [Ketorolac Tromethamine] Anaphylaxis and Hives  . Fish Allergy Hives    Grouper caused hives    Medications Prior to Admission  Medication Sig Dispense Refill Last Dose  . albuterol (PROVENTIL HFA;VENTOLIN HFA) 108 (90 Base) MCG/ACT inhaler  Inhale 1-2 puffs into the lungs every 6 (six) hours as needed for wheezing or shortness of breath.   2 weeks ago at Unknown time  . ibuprofen (ADVIL,MOTRIN) 200 MG tablet Take 600 mg by mouth every 8 (eight) hours as needed for mild pain.   Past Month at Unknown time  . OVER THE COUNTER MEDICATION Take 4 tablets by mouth daily. Balance Vitamins OTC   Past Month at Unknown time  . ibuprofen (ADVIL,MOTRIN) 800 MG tablet Take 1 tablet (800 mg total) by mouth 3 (three) times daily. (Patient not taking: Reported on 03/24/2018) 21 tablet 0 Not Taking at Unknown time  . ketorolac (TORADOL) 10 MG tablet Take 1 tablet (10 mg total) by mouth every 6 (six) hours as needed for moderate pain. (Patient not taking: Reported on 03/24/2018) 20 tablet 0 Not Taking at Unknown time  . ondansetron (ZOFRAN ODT) 8 MG disintegrating tablet Take 1 tablet (8 mg total) by mouth every 8 (eight) hours as needed for nausea or vomiting. (Patient  not taking: Reported on 03/24/2018) 20 tablet 0 Not Taking at Unknown time    Review of Systems  All other systems reviewed and are negative.  Physical Exam   Blood pressure 125/77, pulse (!) 103, temperature 98 F (36.7 C), resp. rate 16, height 5\' 4"  (1.626 m), weight 47.2 kg, last menstrual period 02/17/2018, currently breastfeeding.  Physical Exam  Constitutional: She is oriented to person, place, and time. She appears well-developed and well-nourished.  HENT:  Head: Normocephalic and atraumatic.  Right Ear: External ear normal.  Left Ear: External ear normal.  Cardiovascular: Normal rate, regular rhythm and normal heart sounds.  Respiratory: Effort normal and breath sounds normal. No respiratory distress. She has no wheezes. She has no rales.  GI: Soft. Bowel sounds are normal. She exhibits no distension. There is no tenderness. There is no rebound and no guarding. Hernia confirmed negative in the right inguinal area and confirmed negative in the left inguinal area.   Genitourinary: There is no rash, tenderness, lesion or injury on the right labia. There is no rash, tenderness, lesion or injury on the left labia. Uterus is tender. Uterus is not deviated, not enlarged and not fixed. Cervix exhibits motion tenderness (mild). Cervix exhibits no discharge and no friability. Right adnexum displays tenderness. Right adnexum displays no mass and no fullness. Left adnexum displays tenderness. Left adnexum displays no mass and no fullness. There is bleeding in the vagina. No erythema or tenderness in the vagina. No foreign body in the vagina. No signs of injury around the vagina. No vaginal discharge found.  Lymphadenopathy:       Right: No inguinal adenopathy present.       Left: No inguinal adenopathy present.  Neurological: She is alert and oriented to person, place, and time.  Skin: Skin is warm and dry.  Psychiatric: She has a normal mood and affect. Her behavior is normal. Judgment and thought content normal.    Results for orders placed or performed during the hospital encounter of 03/24/18 (from the past 24 hour(s))  Urinalysis, Routine w reflex microscopic     Status: Abnormal   Collection Time: 03/24/18  3:44 PM  Result Value Ref Range   Color, Urine AMBER (A) YELLOW   APPearance TURBID (A) CLEAR   Specific Gravity, Urine 1.031 (H) 1.005 - 1.030   pH 5.0 5.0 - 8.0   Glucose, UA NEGATIVE NEGATIVE mg/dL   Hgb urine dipstick LARGE (A) NEGATIVE   Bilirubin Urine NEGATIVE NEGATIVE   Ketones, ur 5 (A) NEGATIVE mg/dL   Protein, ur 30 (A) NEGATIVE mg/dL   Nitrite NEGATIVE NEGATIVE   Leukocytes, UA TRACE (A) NEGATIVE   RBC / HPF 11-20 0 - 5 RBC/hpf   WBC, UA 6-10 0 - 5 WBC/hpf   Bacteria, UA FEW (A) NONE SEEN   Squamous Epithelial / LPF 6-10 0 - 5   Mucus PRESENT   CBC     Status: Abnormal   Collection Time: 03/24/18  3:57 PM  Result Value Ref Range   WBC 8.6 4.0 - 10.5 K/uL   RBC 4.90 3.87 - 5.11 MIL/uL   Hemoglobin 15.4 (H) 12.0 - 15.0 g/dL   HCT  40.9 81.1 - 91.4 %   MCV 91.4 80.0 - 100.0 fL   MCH 31.4 26.0 - 34.0 pg   MCHC 34.4 30.0 - 36.0 g/dL   RDW 78.2 95.6 - 21.3 %   Platelets 270 150 - 400 K/uL   nRBC 0.0 0.0 - 0.2 %  Pregnancy, urine POC     Status: None   Collection Time: 03/24/18  4:00 PM  Result Value Ref Range   Preg Test, Ur NEGATIVE NEGATIVE  Wet prep, genital     Status: Abnormal   Collection Time: 03/24/18  4:17 PM  Result Value Ref Range   Yeast Wet Prep HPF POC NONE SEEN NONE SEEN   Trich, Wet Prep NONE SEEN NONE SEEN   Clue Cells Wet Prep HPF POC NONE SEEN NONE SEEN   WBC, Wet Prep HPF POC MODERATE (A) NONE SEEN   Sperm NONE SEEN    US Pelvic Complete With Transvaginal  Result Date: 03/24/2018 CLINICAL DATA:  Abnormal uterine bleeding. Spontaneous abortion approximately 1 month ago. EXAM: TRANSABDOMINAL AND TRANSVAGINAL ULTRASOUND OF PELVIS TECHNIQUE: Both transabdominal and transvaginal ultrasound examinations of the pelvis were performed. Transabdominal technique was performed for global imaging of the pelvis including uterus, ovaries, adnexal regions, and pelvic cul-de-sac. It was necessary to proceed with endovaginal exam following the transabdominal exam to visualize the endometrium and ovaries. COMPARISON:  None FINDINGS: Uterus Measurements: 9.2 x 4.8 x 6.7 cm. No fibroids or other mass visualized. Endometrium Thickness: 7 mm.  No focal abnormality visualized. Right ovary Measurements: 3.2 x 1.8 x 2.3 cm. Normal appearance/no adnexal mass. Left ovary Measurements: 2.2 x 1.8 x 2.1 cm. Normal appearance/no adnexal mass. Other findings No abnormal free fluid. IMPRESSION: Negative. No retained products conception or other significant abnormality identified. Electronically Signed   By: Myles Rosenthal M.D.   On: 03/24/2018 17:18     MAU Course  Procedures  MDM No white count. US shows no evidence of retained products, although it sounds like she had partial miscarriage with completion a few days ago. White count  normal, so I do not think that this patient needs parenteral antibiotics.  Assessment and Plan     ICD-10-CM   1. Endometritis following abortive pregnancy O03.5   2. Abnormal uterine bleeding (AUB) N93.9 US PELVIC COMPLETE WITH TRANSVAGINAL    US PELVIC COMPLETE WITH TRANSVAGINAL   Bleeding improving after completion of miscarriage. Start doxycycline 100mg  BID x14 days. F/u in office in 2 weeks.  Levie Heritage 03/24/2018, 5:14 PM

## 2018-03-24 NOTE — Discharge Instructions (Signed)
Endometritis °Endometritis is irritation, soreness, or inflammation that affects the lining of the uterus (endometrium). °Infection is usually the cause of endometritis. It is important to get treatment to prevent complications. Common complications may include more severe infections and not being able to have children(infertility). °What are the causes? °This condition may be caused by: °· Bacterial infections. °· STIs (sexually transmitted infections). °· A miscarriage or childbirth, especially after a long labor or cesarean delivery. °· Certain gynecological procedures. These may include dilation and curettage (D&C), hysteroscopy, or birth control (contraceptive) insertion. °· Tuberculosis (TB). ° °What are the signs or symptoms? °Symptoms of this condition include: °· Fever. °· Lower abdomen (abdominal) pain. °· Pelvis (pelvic) pain. °· Abnormal vaginal discharge or bleeding. °· Abdominal bloating (distention) or swelling. °· General discomfort or generally feeling ill. °· Discomfort with bowel movements. °· Constipation. ° °How is this diagnosed? °This condition may be diagnosed based on: °· A physical exam, including a pelvic exam. °· Tests, such as: °? Blood tests. °? Removal of a sample of endometrial tissue for testing (endometrial biopsy). °? Examining a sample of vaginal discharge under a microscope (wet prep). °? Removal of a sample of fluid from the cervix for testing (cervical culture). °? Surgical examination of the pelvis and abdomen. ° °How is this treated? °This condition is treated with: °· Antibiotic medicines. °· For more severe cases, hospitalization may be needed to give fluids and antibiotics directly into a vein through an IV tube. ° °Follow these instructions at home: °· Take over-the-counter and prescription medicines only as told by your health care provider. °· Drink enough fluid to keep your urine clear or pale yellow. °· Take your antibiotic medicine as told by your health care  provider. Do not stop taking the antibiotic even if you start to feel better. °· Do not douche or have sex (including vaginal, oral, and anal sex) until your health care provider approves. °· If your endometritis was caused by an STI, do not have sex (including vaginal, oral, and anal sex) until your partner has also been treated for the STI. °· Return to your normal activities as told by your health care provider. Ask your health care provider what activities are safe for you. °· Keep all follow-up visits as told by your health care provider. This is important. °Contact a health care provider if: °· You have pain that does not get better with medicine. °· You have a fever. °· You have pain with bowel movements. °Get help right away if: °· You have abdominal swelling. °· You have abdominal pain that gets worse. °· You have bad-smelling vaginal discharge, or an increased amount of vaginal discharge. °· You have abnormal vaginal bleeding. °· You have nausea and vomiting. °Summary °· Endometritis affects the lining of the uterus (endometrium) and is usually caused by an infection. °· It is important to get treatment to prevent complications. °· You have several treatment options for endometritis. Treatment may include antibiotics and IV fluids. °· Take your antibiotic medicine as told by your health care provider. Do not stop taking the antibiotic even if you start to feel better. °· Do not douche or have sex (including vaginal, oral, and anal sex) until your health care provider approves. °This information is not intended to replace advice given to you by your health care provider. Make sure you discuss any questions you have with your health care provider. °Document Released: 05/19/2001 Document Revised: 06/09/2016 Document Reviewed: 06/09/2016 °Elsevier Interactive Patient Education © 2017   [employer] ° °

## 2018-03-24 NOTE — MAU Note (Signed)
Pt presents to MAU with complaints of vaginal bleeding that has not stopped since a miscarriage Sept the 19th. Is experiencing painful intercourse and  nausea.

## 2018-03-25 LAB — GC/CHLAMYDIA PROBE AMP (~~LOC~~) NOT AT ARMC
CHLAMYDIA, DNA PROBE: NEGATIVE
Neisseria Gonorrhea: NEGATIVE

## 2018-04-11 ENCOUNTER — Ambulatory Visit: Payer: Self-pay | Admitting: Family Medicine

## 2018-04-18 ENCOUNTER — Ambulatory Visit: Payer: Self-pay | Admitting: Family Medicine

## 2018-05-27 ENCOUNTER — Encounter: Payer: Self-pay | Admitting: Family Medicine

## 2018-05-27 ENCOUNTER — Ambulatory Visit: Payer: Self-pay | Admitting: Family Medicine

## 2018-08-17 ENCOUNTER — Ambulatory Visit
Admission: EM | Admit: 2018-08-17 | Discharge: 2018-08-17 | Disposition: A | Discharge status: Home / Self Care | Payer: BLUE CROSS/BLUE SHIELD | Attending: Emergency Medicine | Admitting: Emergency Medicine

## 2018-08-17 DIAGNOSIS — Z3202 Encounter for pregnancy test, result negative: Secondary | ICD-10-CM | POA: Diagnosis not present

## 2018-08-17 DIAGNOSIS — R0602 Shortness of breath: Secondary | ICD-10-CM

## 2018-08-17 DIAGNOSIS — K209 Esophagitis, unspecified without bleeding: Secondary | ICD-10-CM

## 2018-08-17 DIAGNOSIS — N760 Acute vaginitis: Secondary | ICD-10-CM

## 2018-08-17 DIAGNOSIS — R0789 Other chest pain: Secondary | ICD-10-CM

## 2018-08-17 LAB — POCT URINALYSIS DIP (MANUAL ENTRY)
GLUCOSE UA: NEGATIVE mg/dL
LEUKOCYTES UA: NEGATIVE
Nitrite, UA: NEGATIVE
UROBILINOGEN UA: 0.2 U/dL
pH, UA: 6 (ref 5.0–8.0)

## 2018-08-17 LAB — POCT URINE PREGNANCY: Preg Test, Ur: NEGATIVE

## 2018-08-17 MED ORDER — CLINDAMYCIN HCL 150 MG PO CAPS: 300.0000 mg | ORAL_CAPSULE | Freq: Two times a day (BID) | ORAL | 0 refills | Status: AC

## 2018-08-17 MED ORDER — PROMETHAZINE HCL 25 MG PO TABS: 25.0000 mg | ORAL_TABLET | Freq: Four times a day (QID) | ORAL | 0 refills | Status: DC | PRN

## 2018-08-17 MED ORDER — OMEPRAZOLE 20 MG PO CPDR: 20.0000 mg | DELAYED_RELEASE_CAPSULE | Freq: Every day | ORAL | 0 refills | Status: DC

## 2018-08-17 MED ORDER — SUCRALFATE 1 GM/10ML PO SUSP: 1.0000 g | Freq: Three times a day (TID) | ORAL | 0 refills | Status: DC

## 2018-08-17 NOTE — Discharge Instructions (Signed)
We will treat for bacterial vaginosis, pending vaginal sample for confirmation.  Urine is negative for urinary tract infection.  Labs are pending. Will notify you of any positive findings and if any changes to treatment are needed.   Please start daily omeprazole as well as carafate 4 times a day.  Please make appointment with your primary care provider for repeat evaluation as this may take repeated visits to completely sort through.  If worsening of pain fevers, dizziness, weakness, palpitations, or otherwise worsening please go to the ER.

## 2018-08-17 NOTE — ED Triage Notes (Signed)
Pt c/o BV every month since October, been on antibiotics each time. States 2wks ago found a tampon that was in her vagina since 10/19. States the smell is worse then before the tampon was out.

## 2018-08-17 NOTE — ED Provider Notes (Signed)
[facility]    CSN: [id number] Arrival date & time: 08/17/18  1458     History   Chief Complaint Chief Complaint  Patient presents with  . Vaginal Discharge    HPI Melanie Galloway is a 28 y.o. female.   Melanie Galloway presents with multiple complaints today. States has a vaginal odor and discharge. Has been getting treated for BV multiple times since October, last was with Cipro x 10days. States she finished this course approximately 2 weeks ago. States that two days after seeing her PCP she discovered she still had a tampon in the vagina which she feels may have been present since October 2019. She states she also feels swelling to axillary and cervical chain lymph nodes as well as intermittently to inguinal nodes. She has chest pain  And shortness of breath  At times. No cough. States had previous URI symptoms which have resolved. Nausea and vomiting for months, vomits multiple times a day and has very little intake due to this. Drinking fluids but limited. No known fevers. RLQ pelvic pain intermittently. Has approximately 1 loose stool a day. Had labs drawn with her PCP 2/17 without acute findings. She is sexually active with her husband, who is currently deployed, no activity since December of 2019. LMP 08/08/18. States her father just recently passed away last week, she was travelling to [place] frequently to visit him. Hx of asthma, stomach ulcer's, migraines. Doesn't take any medications for ulcers.     ROS per HPI, negative if not otherwise mentioned.      Past Medical History:  Diagnosis Date  . Asthma   . Migraines   . Stomach ulcer     Patient Active Problem List   Diagnosis Date Noted  . 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 SVD (spontaneous vaginal delivery) 12/08/2013  . Anxiety 09/05/2013  . Depression 09/05/2013  . Chronic headache 07/04/2013  . Abnormal maternal serum screening test 06/13/2013  . UTI (urinary tract infection) in pregnancy in  first trimester 06/06/2013  . Supervision of normal first pregnancy 05/09/2013  . Asthma complicating pregnancy, antepartum 05/09/2013    Past Surgical History:  Procedure Laterality Date  . EXTERNAL EAR SURGERY    . EYE MUSCLE SURGERY      OB History    Gravida  2   Para  1   Term  1   Preterm      AB  1   Living  1     SAB  1   TAB      Ectopic      Multiple      Live Births  1            Home Medications    Prior to Admission medications   Medication Sig Start Date End Date Taking? Authorizing Provider  albuterol (PROVENTIL HFA;VENTOLIN HFA) 108 (90 Base) MCG/ACT inhaler Inhale 1-2 puffs into the lungs every 6 (six) hours as needed for wheezing or shortness of breath.    [provider]  clindamycin (CLEOCIN) 150 MG capsule Take 2 capsules (300 mg total) by mouth 2 (two) times daily for 7 days. 08/17/18 08/24/18  Georgetta Haber, NP  ibuprofen (ADVIL,MOTRIN) 200 MG tablet Take 600 mg by mouth every 8 (eight) hours as needed for mild pain.    [provider]  omeprazole (PRILOSEC) 20 MG capsule Take 1 capsule (20 mg total) by mouth daily. 08/17/18   Georgetta Haber, NP  OVER THE COUNTER MEDICATION  Take 4 tablets by mouth daily. Balance Vitamins OTC    [provider]  promethazine (PHENERGAN) 25 MG tablet Take 1 tablet (25 mg total) by mouth every 6 (six) hours as needed for nausea or vomiting. 08/17/18   Georgetta Haber, NP  sucralfate (CARAFATE) 1 GM/10ML suspension Take 10 mLs (1 g total) by mouth 4 (four) times daily -  with meals and at bedtime. 08/17/18   Georgetta Haber, NP    Family History Family History  Problem Relation Age of Onset  . Cancer Mother   . Crohn's disease Mother   . Asthma Sister     Social History Social History   Tobacco Use  . Smoking status: Former Smoker    Packs/day: 1.00    Types: Cigarettes    Last attempt to quit: 06/2017    Years since quitting: 1.1  . Smokeless tobacco: Never Used   Substance Use Topics  . Alcohol use: Yes    Comment: Socially  . Drug use: Not Currently    Types: Marijuana    Comment: 2014     Allergies   Toradol [ketorolac tromethamine] and Fish allergy   Review of Systems Review of Systems   Physical Exam Triage Vital Signs ED Triage Vitals  Enc Vitals Group     BP 08/17/18 1517 110/69     Pulse Rate 08/17/18 1517 90     Resp 08/17/18 1517 20     Temp 08/17/18 1517 98.3 F (36.8 C)     Temp Source 08/17/18 1517 Oral     SpO2 08/17/18 1517 99 %     Weight --      Height --      Head Circumference --      Peak Flow --      Pain Score 08/17/18 1518 7     Pain Loc --      Pain Edu? --      Excl. in GC? --    No data found.  Updated Vital Signs BP 110/69 (BP Location: Left Arm)   Pulse 90   Temp 98.3 F (36.8 C) (Oral)   Resp 20   LMP 08/08/2018   SpO2 99%    Physical Exam Constitutional:      General: She is not in acute distress.    Appearance: She is well-developed and underweight.  HENT:     Head: Normocephalic and atraumatic.  Cardiovascular:     Rate and Rhythm: Normal rate and regular rhythm.     Heart sounds: Normal heart sounds.  Pulmonary:     Effort: Pulmonary effort is normal.     Breath sounds: Normal breath sounds.  Chest:     Chest wall: No tenderness.  Abdominal:     General: Abdomen is flat.  Genitourinary:    General: Normal vulva.     Pubic Area: No rash.      Vagina: Vaginal discharge present.     Cervix: Discharge present. No lesion or cervical bleeding.     Uterus: Tender.      Comments: Mild tenderness with bimanual exam to uterus without adnexal tenderness; thick yellow discharge noted  Lymphadenopathy:     Upper Body:     Right upper body: Axillary adenopathy present.     Left upper body: Axillary adenopathy present.     Lower Body: No right inguinal adenopathy. No left inguinal adenopathy.  Skin:    General: Skin is warm and dry.  Neurological:  Mental Status: She is  alert and oriented to person, place, and time.  Psychiatric:     Comments: Patient became tearful during history at one point related persistent symptoms     EKG: NSR rate 70 . Previous EKG was available for review. No acute changes. No stwave changes as interpreted by me.    UC Treatments / Results  Labs (all labs ordered are listed, but only abnormal results are displayed) Labs Reviewed  POCT URINALYSIS DIP (MANUAL ENTRY) - Abnormal; Notable for the following components:      Result Value   Bilirubin, UA small (*)    Ketones, POC UA trace (5) (*)    Spec Grav, UA >=1.030 (*)    Blood, UA trace-lysed (*)    Protein Ur, POC =30 (*)    All other components within normal limits  RPR  HIV ANTIBODY (ROUTINE TESTING W REFLEX)  COMPREHENSIVE METABOLIC PANEL  CBC WITH DIFFERENTIAL/PLATELET  POCT URINE PREGNANCY  CERVICOVAGINAL ANCILLARY ONLY    EKG None  Radiology No results found.  Procedures Procedures (including critical care time)  Medications Ordered in UC Medications - No data to display  Initial Impression / Assessment and Plan / UC Course  I have reviewed the triage vital signs and the nursing notes.  Pertinent labs & imaging results that were available during my care of the patient were reviewed by me and considered in my medical decision making (see chart for details).     Patient with vomiting for months now. Appears underweight. Multiple social stressors obvious. Basic labs redrawn here today. Vaginal odor and discharge visible. Cytology pending, clindamycin provided empirically and denies source of potential STI. Adenopathy present, resolve URI. HIV RPR collected as well. Chest pain , constant and is not new or isolated today. Concern for esoaphagitis and/or gastritis, with carafate and omeprazole provided. Encouraged close follow up with PCP for recheck as likely will need recheck and reevaluation. Return precautions provided. Patient verbalized understanding and  agreeable to plan.  Ambulatory out of clinic without difficulty.    Final Clinical Impressions(s) / UC Diagnoses   Final diagnoses:  Acute vaginitis  Acute esophagitis     Discharge Instructions     We will treat for bacterial vaginosis, pending vaginal sample for confirmation.  Urine is negative for urinary tract infection.  Labs are pending. Will notify you of any positive findings and if any changes to treatment are needed.   Please start daily omeprazole as well as carafate 4 times a day.  Please make appointment with your primary care provider for repeat evaluation as this may take repeated visits to completely sort through.  If worsening of pain fevers, dizziness, weakness, palpitations, or otherwise worsening please go to the ER.    ED Prescriptions    Medication Sig Dispense Auth. Provider   clindamycin (CLEOCIN) 150 MG capsule Take 2 capsules (300 mg total) by mouth 2 (two) times daily for 7 days. 28 capsule Linus Mako B, NP   promethazine (PHENERGAN) 25 MG tablet Take 1 tablet (25 mg total) by mouth every 6 (six) hours as needed for nausea or vomiting. 30 tablet Linus Mako B, NP   omeprazole (PRILOSEC) 20 MG capsule Take 1 capsule (20 mg total) by mouth daily. 30 capsule Linus Mako B, NP   sucralfate (CARAFATE) 1 GM/10ML suspension Take 10 mLs (1 g total) by mouth 4 (four) times daily -  with meals and at bedtime. 420 mL Georgetta Haber, NP  Controlled Substance Prescriptions [place] Controlled Substance Registry consulted? Not Applicable   Georgetta Haber, NP 08/17/18 1711

## 2018-08-18 LAB — CBC WITH DIFFERENTIAL/PLATELET
BASOS ABS: 0 10*3/uL (ref 0.0–0.2)
BASOS: 1 %
EOS (ABSOLUTE): 0.1 10*3/uL (ref 0.0–0.4)
Eos: 2 %
HEMOGLOBIN: 15.2 g/dL (ref 11.1–15.9)
Hematocrit: 43.1 % (ref 34.0–46.6)
IMMATURE GRANS (ABS): 0 10*3/uL (ref 0.0–0.1)
IMMATURE GRANULOCYTES: 0 %
LYMPHS: 35 %
Lymphocytes Absolute: 2 10*3/uL (ref 0.7–3.1)
MCH: 31 pg (ref 26.6–33.0)
MCHC: 35.3 g/dL (ref 31.5–35.7)
MCV: 88 fL (ref 79–97)
MONOCYTES: 8 %
Monocytes Absolute: 0.5 10*3/uL (ref 0.1–0.9)
NEUTROS ABS: 3.1 10*3/uL (ref 1.4–7.0)
NEUTROS PCT: 54 %
Platelets: 294 10*3/uL (ref 150–450)
RBC: 4.9 x10E6/uL (ref 3.77–5.28)
RDW: 12.3 % (ref 11.7–15.4)
WBC: 5.7 10*3/uL (ref 3.4–10.8)

## 2018-08-18 LAB — COMPREHENSIVE METABOLIC PANEL
A/G RATIO: 1.7 (ref 1.2–2.2)
ALT: 10 IU/L (ref 0–32)
AST: 13 IU/L (ref 0–40)
Albumin: 4.5 g/dL (ref 3.9–5.0)
Alkaline Phosphatase: 49 IU/L (ref 39–117)
BUN/Creatinine Ratio: 13 (ref 9–23)
BUN: 11 mg/dL (ref 6–20)
Bilirubin Total: 0.6 mg/dL (ref 0.0–1.2)
CHLORIDE: 99 mmol/L (ref 96–106)
CO2: 25 mmol/L (ref 20–29)
Calcium: 9.9 mg/dL (ref 8.7–10.2)
Creatinine, Ser: 0.84 mg/dL (ref 0.57–1.00)
GFR, EST AFRICAN AMERICAN: 112 mL/min/{1.73_m2} (ref >59)
GFR, EST NON AFRICAN AMERICAN: 97 mL/min/{1.73_m2} (ref >59)
GLOBULIN, TOTAL: 2.6 g/dL (ref 1.5–4.5)
GLUCOSE: 100 mg/dL — AB (ref 65–99)
POTASSIUM: 4.1 mmol/L (ref 3.5–5.2)
Sodium: 139 mmol/L (ref 134–144)
Total Protein: 7.1 g/dL (ref 6.0–8.5)

## 2018-08-18 LAB — RPR: RPR: NONREACTIVE

## 2018-08-18 LAB — HIV ANTIBODY (ROUTINE TESTING W REFLEX): HIV Screen 4th Generation wRfx: NONREACTIVE

## 2018-08-22 ENCOUNTER — Telehealth (HOSPITAL_COMMUNITY): Payer: Self-pay | Admitting: Emergency Medicine

## 2018-08-22 LAB — CERVICOVAGINAL ANCILLARY ONLY
Bacterial vaginitis: POSITIVE — AB
Candida vaginitis: NEGATIVE
Chlamydia: NEGATIVE
Neisseria Gonorrhea: NEGATIVE
Trichomonas: NEGATIVE

## 2018-08-22 NOTE — Telephone Encounter (Signed)
Patient contacted and made aware of all results, all questions answered.  BV treated with clindamycin.

## 2018-10-03 ENCOUNTER — Other Ambulatory Visit: Payer: Self-pay

## 2018-10-03 ENCOUNTER — Encounter: Payer: Self-pay | Admitting: Emergency Medicine

## 2018-10-03 ENCOUNTER — Ambulatory Visit
Admission: EM | Admit: 2018-10-03 | Discharge: 2018-10-03 | Disposition: A | Discharge status: Home / Self Care | Payer: BLUE CROSS/BLUE SHIELD

## 2018-10-03 DIAGNOSIS — Z7689 Persons encountering health services in other specified circumstances: Secondary | ICD-10-CM | POA: Diagnosis not present

## 2018-10-03 NOTE — ED Triage Notes (Signed)
Pt presents after having a fever Wednesday and Thursday of last week.  Pt states fever of 101.4.  Denies symptoms at this time.

## 2018-10-03 NOTE — ED Notes (Signed)
Patient able to ambulate independently  

## 2018-10-03 NOTE — Discharge Instructions (Addendum)
We will have you return to work on Friday If anything changes between now and then please follow-up

## 2018-10-03 NOTE — ED Provider Notes (Signed)
[facility]    CSN: [id number] Arrival date & time: 10/03/18  1443     History   Chief Complaint Chief Complaint  Patient presents with  . Follow-up    HPI Melanie Galloway is a 28 y.o. female.   Patient is a 28 year old female who presents today for note to return to work.  Reporting that she had a fever of approximately 101.4 last Wednesday and Thursday.  She has been fever free since Friday morning.  Reporting mild cough and sneezing.  Also reporting history of allergies.  She is not currently taking any medications.  She was taking fever reducer during the period of fever. Currently asymptomatic and feeling fine. No recent travels. No known COVID exposures.   ROS per HPI      Past Medical History:  Diagnosis Date  . Asthma   . Migraines   . Stomach ulcer     Patient Active Problem List   Diagnosis Date Noted  . 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 SVD (spontaneous vaginal delivery) 12/08/2013  . Anxiety 09/05/2013  . Depression 09/05/2013  . Chronic headache 07/04/2013  . Abnormal maternal serum screening test 06/13/2013  . UTI (urinary tract infection) in pregnancy in first trimester 06/06/2013  . Supervision of normal first pregnancy 05/09/2013  . Asthma complicating pregnancy, antepartum 05/09/2013    Past Surgical History:  Procedure Laterality Date  . EXTERNAL EAR SURGERY    . EYE MUSCLE SURGERY      OB History    Gravida  2   Para  1   Term  1   Preterm      AB  1   Living  1     SAB  1   TAB      Ectopic      Multiple      Live Births  1            Home Medications    Prior to Admission medications   Medication Sig Start Date End Date Taking? Authorizing Provider  albuterol (PROVENTIL HFA;VENTOLIN HFA) 108 (90 Base) MCG/ACT inhaler Inhale 1-2 puffs into the lungs every 6 (six) hours as needed for wheezing or shortness of breath.    [provider]  ibuprofen (ADVIL,MOTRIN) 200 MG tablet Take  600 mg by mouth every 8 (eight) hours as needed for mild pain.    [provider]  omeprazole (PRILOSEC) 20 MG capsule Take 1 capsule (20 mg total) by mouth daily. 08/17/18   Georgetta HaberBurky, Natalie B, NP  OVER THE COUNTER MEDICATION Take 4 tablets by mouth daily. Balance Vitamins OTC    [provider]  promethazine (PHENERGAN) 25 MG tablet Take 1 tablet (25 mg total) by mouth every 6 (six) hours as needed for nausea or vomiting. 08/17/18   Georgetta HaberBurky, Natalie B, NP  sucralfate (CARAFATE) 1 GM/10ML suspension Take 10 mLs (1 g total) by mouth 4 (four) times daily -  with meals and at bedtime. 08/17/18   Georgetta HaberBurky, Natalie B, NP    Family History Family History  Problem Relation Age of Onset  . Cancer Mother   . Crohn's disease Mother   . Asthma Sister     Social History Social History   Tobacco Use  . Smoking status: Former Smoker    Packs/day: 1.00    Types: Cigarettes    Last attempt to quit: 06/2017    Years since quitting: 1.3  . Smokeless tobacco: Never Used  Substance Use Topics  .  Alcohol use: Yes    Comment: Socially  . Drug use: Not Currently    Types: Marijuana    Comment: 2014     Allergies   Toradol [ketorolac tromethamine] and Fish allergy   Review of Systems Review of Systems   Physical Exam Triage Vital Signs ED Triage Vitals  Enc Vitals Group     BP 10/03/18 1453 106/70     Pulse Rate 10/03/18 1453 100     Resp 10/03/18 1453 16     Temp 10/03/18 1453 98.6 F (37 C)     Temp Source 10/03/18 1453 Oral     SpO2 10/03/18 1453 97 %     Weight --      Height --      Head Circumference --      Peak Flow --      Pain Score 10/03/18 1454 0     Pain Loc --      Pain Edu? --      Excl. in GC? --    No data found.  Updated Vital Signs BP 106/70 (BP Location: Left Arm)   Pulse 100   Temp 98.6 F (37 C) (Oral)   Resp 16   LMP 09/30/2018   SpO2 97%   Visual Acuity Right Eye Distance:   Left Eye Distance:   Bilateral Distance:    Right Eye  Near:   Left Eye Near:    Bilateral Near:     Physical Exam Vitals signs and nursing note reviewed.  Constitutional:      General: She is not in acute distress.    Appearance: Normal appearance. She is well-developed. She is not ill-appearing.  HENT:     Head: Normocephalic and atraumatic.     Nose: Nose normal.     Mouth/Throat:     Pharynx: Oropharynx is clear.  Eyes:     Conjunctiva/sclera: Conjunctivae normal.  Neck:     Musculoskeletal: Neck supple.  Cardiovascular:     Rate and Rhythm: Normal rate and regular rhythm.     Heart sounds: No murmur.  Pulmonary:     Effort: Pulmonary effort is normal. No respiratory distress.     Breath sounds: Normal breath sounds.  Musculoskeletal: Normal range of motion.  Skin:    General: Skin is warm and dry.  Neurological:     Mental Status: She is alert.  Psychiatric:        Mood and Affect: Mood normal.      UC Treatments / Results  Labs (all labs ordered are listed, but only abnormal results are displayed) Labs Reviewed - No data to display  EKG None  Radiology No results found.  Procedures Procedures (including critical care time)  Medications Ordered in UC Medications - No data to display  Initial Impression / Assessment and Plan / UC Course  I have reviewed the triage vital signs and the nursing notes.  Pertinent labs & imaging results that were available during my care of the patient were reviewed by me and considered in my medical decision making (see chart for details).    Return to work note.   Pt appears well VSS stable and exam normal I feel it would be safe for her return to work on Friday In case this is COVID related Friday will be 1 week fever free and symptom free. We will have her return then to decrease risk of possible exposure if this is COVID. I am not convinced this is COVID but  to be safe.  Pt understanding and agreed.  Follow up as needed for continued or worsening symptoms  Final  Clinical Impressions(s) / UC Diagnoses   Final diagnoses:  Return to work exam     Discharge Instructions     We will have you return to work on Friday If anything changes between now and then please follow-up    ED Prescriptions    None     Controlled Substance Prescriptions [place] Controlled Substance Registry consulted? Not Applicable   Janace Aris, NP 10/03/18 1554

## 2018-10-10 ENCOUNTER — Emergency Department (HOSPITAL_COMMUNITY)
Admission: EM | Admit: 2018-10-10 | Discharge: 2018-10-10 | Disposition: A | Discharge status: Home / Self Care | Payer: BLUE CROSS/BLUE SHIELD | Attending: Emergency Medicine | Admitting: Emergency Medicine

## 2018-10-10 ENCOUNTER — Emergency Department (HOSPITAL_COMMUNITY): Payer: BLUE CROSS/BLUE SHIELD

## 2018-10-10 ENCOUNTER — Encounter (HOSPITAL_COMMUNITY): Payer: Self-pay

## 2018-10-10 ENCOUNTER — Other Ambulatory Visit: Payer: Self-pay

## 2018-10-10 DIAGNOSIS — N898 Other specified noninflammatory disorders of vagina: Secondary | ICD-10-CM | POA: Diagnosis not present

## 2018-10-10 DIAGNOSIS — Z87891 Personal history of nicotine dependence: Secondary | ICD-10-CM | POA: Diagnosis not present

## 2018-10-10 DIAGNOSIS — J45909 Unspecified asthma, uncomplicated: Secondary | ICD-10-CM | POA: Insufficient documentation

## 2018-10-10 DIAGNOSIS — R102 Pelvic and perineal pain: Secondary | ICD-10-CM | POA: Diagnosis not present

## 2018-10-10 DIAGNOSIS — R630 Anorexia: Secondary | ICD-10-CM | POA: Diagnosis not present

## 2018-10-10 DIAGNOSIS — R112 Nausea with vomiting, unspecified: Secondary | ICD-10-CM | POA: Diagnosis not present

## 2018-10-10 DIAGNOSIS — R1084 Generalized abdominal pain: Secondary | ICD-10-CM

## 2018-10-10 DIAGNOSIS — R197 Diarrhea, unspecified: Secondary | ICD-10-CM | POA: Diagnosis not present

## 2018-10-10 DIAGNOSIS — R2232 Localized swelling, mass and lump, left upper limb: Secondary | ICD-10-CM | POA: Diagnosis not present

## 2018-10-10 DIAGNOSIS — K5900 Constipation, unspecified: Secondary | ICD-10-CM | POA: Diagnosis not present

## 2018-10-10 DIAGNOSIS — R55 Syncope and collapse: Secondary | ICD-10-CM | POA: Insufficient documentation

## 2018-10-10 LAB — COMPREHENSIVE METABOLIC PANEL
ALT: 16 U/L (ref 0–44)
AST: 18 U/L (ref 15–41)
Albumin: 5.3 g/dL — ABNORMAL HIGH (ref 3.5–5.0)
Alkaline Phosphatase: 49 U/L (ref 38–126)
Anion gap: 11 (ref 5–15)
BUN: 12 mg/dL (ref 6–20)
CO2: 21 mmol/L — ABNORMAL LOW (ref 22–32)
Calcium: 9.1 mg/dL (ref 8.9–10.3)
Chloride: 104 mmol/L (ref 98–111)
Creatinine, Ser: 0.81 mg/dL (ref 0.44–1.00)
GFR calc Af Amer: >60 mL/min (ref >60)
GFR calc non Af Amer: >60 mL/min (ref >60)
Glucose, Bld: 87 mg/dL (ref 70–99)
Potassium: 3.7 mmol/L (ref 3.5–5.1)
Sodium: 136 mmol/L (ref 135–145)
Total Bilirubin: 1.2 mg/dL (ref 0.3–1.2)
Total Protein: 7.9 g/dL (ref 6.5–8.1)

## 2018-10-10 LAB — WET PREP, GENITAL
Sperm: NONE SEEN
Trich, Wet Prep: NONE SEEN
Yeast Wet Prep HPF POC: NONE SEEN

## 2018-10-10 LAB — CBC WITH DIFFERENTIAL/PLATELET
Abs Immature Granulocytes: 0.01 10*3/uL (ref 0.00–0.07)
Basophils Absolute: 0.1 10*3/uL (ref 0.0–0.1)
Basophils Relative: 1 %
Eosinophils Absolute: 0 10*3/uL (ref 0.0–0.5)
Eosinophils Relative: 1 %
HCT: 42.6 % (ref 36.0–46.0)
Hemoglobin: 14.5 g/dL (ref 12.0–15.0)
Immature Granulocytes: 0 %
Lymphocytes Relative: 33 %
Lymphs Abs: 1.9 10*3/uL (ref 0.7–4.0)
MCH: 32 pg (ref 26.0–34.0)
MCHC: 34 g/dL (ref 30.0–36.0)
MCV: 94 fL (ref 80.0–100.0)
Monocytes Absolute: 0.5 10*3/uL (ref 0.1–1.0)
Monocytes Relative: 9 %
Neutro Abs: 3.3 10*3/uL (ref 1.7–7.7)
Neutrophils Relative %: 56 %
Platelets: 232 10*3/uL (ref 150–400)
RBC: 4.53 MIL/uL (ref 3.87–5.11)
RDW: 12.6 % (ref 11.5–15.5)
WBC: 5.8 10*3/uL (ref 4.0–10.5)
nRBC: 0 % (ref 0.0–0.2)

## 2018-10-10 LAB — URINALYSIS, ROUTINE W REFLEX MICROSCOPIC
Bacteria, UA: NONE SEEN
Bilirubin Urine: NEGATIVE
Glucose, UA: NEGATIVE mg/dL
Hgb urine dipstick: NEGATIVE
Ketones, ur: 80 mg/dL — AB
Leukocytes,Ua: NEGATIVE
Nitrite: NEGATIVE
Protein, ur: 30 mg/dL — AB
Specific Gravity, Urine: 1.029 (ref 1.005–1.030)
pH: 5 (ref 5.0–8.0)

## 2018-10-10 LAB — LIPASE, BLOOD: Lipase: 29 U/L (ref 11–51)

## 2018-10-10 LAB — POC URINE PREG, ED: Preg Test, Ur: NEGATIVE

## 2018-10-10 LAB — RAPID HIV SCREEN (HIV 1/2 AB+AG)
HIV 1/2 Antibodies: NONREACTIVE
HIV-1 P24 Antigen - HIV24: NONREACTIVE

## 2018-10-10 LAB — MONONUCLEOSIS SCREEN: Mono Screen: NEGATIVE

## 2018-10-10 MED ORDER — CEFTRIAXONE SODIUM 250 MG IJ SOLR
250.0000 mg | Freq: Once | INTRAMUSCULAR | Status: AC
  Administered 2018-10-10: 250 mg via INTRAMUSCULAR
  Filled 2018-10-10: qty 250

## 2018-10-10 MED ORDER — PANTOPRAZOLE SODIUM 20 MG PO TBEC: 20.0000 mg | DELAYED_RELEASE_TABLET | Freq: Two times a day (BID) | ORAL | 0 refills | Status: DC

## 2018-10-10 MED ORDER — ALUM & MAG HYDROXIDE-SIMETH 200-200-20 MG/5ML PO SUSP
30.0000 mL | Freq: Once | ORAL | Status: AC
  Administered 2018-10-10: 30 mL via ORAL
  Filled 2018-10-10: qty 30

## 2018-10-10 MED ORDER — LIDOCAINE VISCOUS HCL 2 % MT SOLN
15.0000 mL | Freq: Once | OROMUCOSAL | Status: AC
  Administered 2018-10-10: 22:00:00 15 mL via ORAL
  Filled 2018-10-10: qty 15

## 2018-10-10 MED ORDER — METRONIDAZOLE 500 MG PO TABS
500.0000 mg | ORAL_TABLET | Freq: Once | ORAL | Status: AC
  Administered 2018-10-10: 500 mg via ORAL
  Filled 2018-10-10: qty 1

## 2018-10-10 MED ORDER — LIDOCAINE HCL 1 % IJ SOLN
INTRAMUSCULAR | Status: AC
  Administered 2018-10-10: 20 mL
  Filled 2018-10-10: qty 20

## 2018-10-10 MED ORDER — METRONIDAZOLE 500 MG PO TABS: 500.0000 mg | ORAL_TABLET | Freq: Two times a day (BID) | ORAL | 0 refills | Status: AC

## 2018-10-10 MED ORDER — IOHEXOL 300 MG/ML  SOLN
100.0000 mL | Freq: Once | INTRAMUSCULAR | Status: AC | PRN
  Administered 2018-10-10: 100 mL via INTRAVENOUS

## 2018-10-10 MED ORDER — SUCRALFATE 1 G PO TABS: 1.0000 g | ORAL_TABLET | Freq: Three times a day (TID) | ORAL | 0 refills | Status: DC

## 2018-10-10 MED ORDER — DOXYCYCLINE HYCLATE 100 MG PO TABS
100.0000 mg | ORAL_TABLET | Freq: Once | ORAL | Status: AC
  Administered 2018-10-10: 22:00:00 100 mg via ORAL
  Filled 2018-10-10: qty 1

## 2018-10-10 MED ORDER — SODIUM CHLORIDE 0.9% FLUSH: 3.0000 mL | Freq: Once | INTRAVENOUS | Status: DC

## 2018-10-10 MED ORDER — SODIUM CHLORIDE (PF) 0.9 % IJ SOLN
INTRAMUSCULAR | Status: AC
  Filled 2018-10-10: qty 50

## 2018-10-10 MED ORDER — DICYCLOMINE HCL 20 MG PO TABS: 20.0000 mg | ORAL_TABLET | Freq: Two times a day (BID) | ORAL | 0 refills | Status: DC

## 2018-10-10 MED ORDER — DOXYCYCLINE HYCLATE 100 MG PO CAPS: 100.0000 mg | ORAL_CAPSULE | Freq: Two times a day (BID) | ORAL | 0 refills | Status: AC

## 2018-10-10 MED ORDER — DICYCLOMINE HCL 10 MG PO CAPS
20.0000 mg | ORAL_CAPSULE | Freq: Once | ORAL | Status: AC
  Administered 2018-10-10: 20 mg via ORAL
  Filled 2018-10-10: qty 2

## 2018-10-10 MED ORDER — PANTOPRAZOLE SODIUM 40 MG PO TBEC
40.0000 mg | DELAYED_RELEASE_TABLET | Freq: Once | ORAL | Status: AC
  Administered 2018-10-10: 40 mg via ORAL
  Filled 2018-10-10: qty 1

## 2018-10-10 MED ORDER — SODIUM CHLORIDE 0.9 % IV BOLUS
1000.0000 mL | Freq: Once | INTRAVENOUS | Status: AC
  Administered 2018-10-10: 1000 mL via INTRAVENOUS

## 2018-10-10 MED ORDER — ONDANSETRON 4 MG PO TBDP
4.0000 mg | ORAL_TABLET | Freq: Once | ORAL | Status: AC
  Administered 2018-10-10: 22:00:00 4 mg via ORAL
  Filled 2018-10-10: qty 1

## 2018-10-10 NOTE — Discharge Instructions (Addendum)
Please see the information and instructions below regarding your visit.  Your diagnoses today include:  1. Generalized abdominal pain   2. Pelvic pain   3. Vaginal discharge    Your work-up is overall very reassuring.  We had negative tests for mononucleosis, HIV.  Your comprehensive metabolic panel looked normal, and you had normal blood counts.  Your wet prep shows some clue cells indicative of bacterial vaginosis, a overgrowth of normal bacteria in the vagina.  Your tests for gonorrhea and chlamydia will be back in a couple days.  Receive a call for any positive results.  Tests performed today include: See side panel of your discharge paperwork for testing performed today. Vital signs are listed at the bottom of these instructions.   Medications prescribed:    Take any prescribed medications only as prescribed, and any over the counter medications only as directed on the packaging.  Please start taking Protonix 20 mg with breakfast and in the evening.  Please start taking a medicine called Carafate or sucralfate.  This can be taken up to 4 times a day, with meals and before bedtime.  Please do not take your proton pump inhibitor (Protonix, Nexium, Prilosec) within 30 minutes of taking Carafate.  You may take Bentyl twice a day for abdominal cramping.  Doxycycline is an antibiotic that fights infection in the pelvis. This medication can make your skin sensitive to the sun, so please ensure that you wear sunscreen, hats, or other coverage over your skin while taking this. This medicine CANNOT be taken by women while pregnant, breastfeeding, or trying to become pregnant.  Please speak with a healthcare provider if any of these situations apply to you.  Flagyl is an antibiotic used to treat bacterial vaginosis and PID. This medication CANNOT be taken with alcohol, because it can cause nausea and vomiting combined with alcohol. Please also refrain from drinking alcohol for 48 hours after you  finish the medicine.  Home care instructions:  Please follow any educational materials contained in this packet.   Follow-up instructions: Please follow-up with your primary care provider within the next week for further evaluation of your symptoms if they are not completely improved.   Please follow up with [facility] gastroenterology.  Please call the clinic to set up an appointment that works best for your schedule.  Return instructions:  Please return to the Emergency Department if you experience worsening symptoms.  Please return to the emergency department he develop any worsening pain, inability to tolerate food or drink by mouth, vomiting blood or having stools that are dark and black or have blood in them. Please return if you have any other emergent concerns.  Additional Information:   Your vital signs today were: BP 113/64 (BP Location: Left Arm)    Pulse 64    Temp 98.1 F (36.7 C) (Oral)    Resp 18    Ht 5\' 4"  (1.626 m)    Wt 49.9 kg    LMP 09/30/2018    SpO2 99%    BMI 18.88 kg/m  If your blood pressure (BP) was elevated on multiple readings during this visit above 130 for the top number or above 80 for the bottom number, please have this repeated by your primary care provider within one month. --------------  Thank you for allowing [place] to participate in your care today.

## 2018-10-10 NOTE — ED Notes (Signed)
Pelvic cart set up 

## 2018-10-10 NOTE — ED Provider Notes (Signed)
[facility]-EMERGENCY DEPT Provider Note   CSN: [id number] Arrival date & time: 10/10/18  1735    History   Chief Complaint Chief Complaint  Patient presents with   Abdominal Pain    HPI Melanie Galloway is a 28 y.o. female.     HPI  Patient is a 28 year old female past medical history of peptic ulcer disease, asthma, migraine headaches, positive amatory disease presenting for generalized abdominal pain, loss of appetite, and vaginal discharge.  Patient reports that her abdominal pain has been ongoing for approximately 4 months now.  She reports that is been getting worse and over the past 5 days she has had inability to tolerate food by mouth due to pain and lack of appetite.  Denies nausea or vomiting.  Patient reports that she has had intermittent episodes of constipation for the past several months but now is having mostly loose stools.  She reports stools are darker than normal, but does not believe she has had tarry stools.  Denies blood in stools.  She is passing gas.  Patient denies any dysuria, urgency, or frequency.  She also reports that she has increased vaginal discharge and she is felt that she had a "bump" in her vagina.  Patient reports that she had a recent new sexual partner within the last couple months, but is not concerned about STI.  Patient reports that today she called her primary care provider regarding her symptoms and then in the process of leaving to be evaluated by ER versus urgent care, she experienced a syncopal episode.  Patient ports that she felt a prodromal sensation of feeling lightheaded and having blurred vision.  She did not hit her head in this process.  Mother found her on the floor.  Patient also notes that over the past couple weeks she noted that she had noticed a "lump" above her left clavicle that seems to be coming and going.  She also feels that she has lymph nodes in the neck, axilla, and inguinal region.   Patient reports  that she has a remote history of abdominal pain and when she was evaluated in [place] she was told by gastroenterologist that she might have IBD. She never received confirmatory diagnosis.  Patient reports that she has had several interruptions in her medical care with the move from [place].  She has had several stressors in her life recently with the death of the father couple months ago suddenly from respiratory illness, and the break-up of her marriage.  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   SVD (spontaneous vaginal delivery) 12/08/2013   Anxiety 09/05/2013   Depression 09/05/2013   Chronic headache 07/04/2013   Abnormal maternal serum screening test 06/13/2013   UTI (urinary tract infection) in pregnancy in first trimester 06/06/2013   Supervision of normal first pregnancy 05/09/2013   Asthma complicating pregnancy, antepartum 05/09/2013    Past Surgical History:  Procedure Laterality Date   EXTERNAL EAR SURGERY     EYE MUSCLE SURGERY     EYE SURGERY     left      OB History    Gravida  2   Para  1   Term  1   Preterm      AB  1   Living  1  SAB  1   TAB      Ectopic      Multiple      Live Births  1            Home Medications    Prior to Admission medications   Medication Sig Start Date End Date Taking? Authorizing Provider  butalbital-acetaminophen-caffeine (FIORICET) 50-325-40 MG tablet Take 1 tablet by mouth daily as needed for migraine. 09/26/18  Yes [provider]  omeprazole (PRILOSEC) 20 MG capsule Take 1 capsule (20 mg total) by mouth daily. Patient not taking: Reported on 10/10/2018 08/17/18   Georgetta Haber, NP  promethazine (PHENERGAN) 25 MG tablet Take 1 tablet (25 mg total) by mouth every 6 (six) hours as needed for nausea or vomiting. Patient not taking: Reported on  10/10/2018 08/17/18   Linus Mako B, NP  sucralfate (CARAFATE) 1 GM/10ML suspension Take 10 mLs (1 g total) by mouth 4 (four) times daily -  with meals and at bedtime. Patient not taking: Reported on 10/10/2018 08/17/18   Georgetta Haber, NP    Family History Family History  Problem Relation Age of Onset   Cancer Mother    Crohn's disease Mother    Asthma Sister     Social History Social History   Tobacco Use   Smoking status: Former Smoker    Packs/day: 1.00    Types: Cigarettes    Last attempt to quit: 06/2017    Years since quitting: 1.3   Smokeless tobacco: Never Used  Substance Use Topics   Alcohol use: Yes    Comment: Socially   Drug use: Not Currently    Types: Marijuana    Comment: 2014     Allergies   Toradol [ketorolac tromethamine] and Fish allergy   Review of Systems Review of Systems  Constitutional: Positive for appetite change, fatigue and unexpected weight change. Negative for chills and fever.  HENT: Negative for congestion and sore throat.   Eyes: Negative for visual disturbance.  Respiratory: Negative for cough, chest tightness and shortness of breath.   Cardiovascular: Negative for chest pain, palpitations and leg swelling.  Gastrointestinal: Positive for abdominal pain, constipation and diarrhea. Negative for nausea and vomiting.  Genitourinary: Positive for pelvic pain and vaginal discharge. Negative for dysuria, flank pain, urgency and vaginal bleeding.  Musculoskeletal: Negative for back pain and myalgias.  Skin: Negative for rash.  Neurological: Positive for syncope. Negative for dizziness, weakness, light-headedness and numbness.     Physical Exam Updated Vital Signs BP 113/64 (BP Location: Left Arm)    Pulse 64    Temp 98.1 F (36.7 C) (Oral)    Resp 18    Ht  (1.626 m)    Wt 49.9 kg    LMP 09/30/2018    SpO2 99%    BMI 18.88 kg/m   Physical Exam Vitals signs and nursing note reviewed.  Constitutional:      General: She  is not in acute distress.    Appearance: She is well-developed. She is not ill-appearing or diaphoretic.     Comments: Tearful at times on exam.   HENT:     Head: Normocephalic and atraumatic.  Eyes:     Conjunctiva/sclera: Conjunctivae normal.     Pupils: Pupils are equal, round, and reactive to light.  Neck:     Musculoskeletal: Normal range of motion and neck supple.  Cardiovascular:     Rate and Rhythm: Normal rate and regular rhythm.     Heart  sounds: S1 normal and S2 normal. No murmur.  Pulmonary:     Effort: Pulmonary effort is normal.     Breath sounds: Normal breath sounds. No wheezing or rales.  Abdominal:     General: Bowel sounds are normal. There is no distension.     Palpations: Abdomen is soft.     Tenderness: There is generalized abdominal tenderness. There is no guarding or rebound.  Genitourinary:    Comments: Pelvic examination performed with RN chaperone present.  Patient has some horizontal chain lymphadenopathy.  Vaginal tissue pink and rugated.  Cervix nonerythematous and nonfriable.  There is a moderate amount of milky white discharge present in vaginal vault. On bimanual exam, patient has no cervical motion discomfort and has discomfort to palpation of uterine fundus in the bilateral adnexa.  Rectal examination performed with RN chaperone present.  Patient has hard brown stool present in rectal vault.  No melena or hematochezia. Musculoskeletal: Normal range of motion.        General: No deformity.  Lymphadenopathy:     Cervical: No cervical adenopathy.  Skin:    General: Skin is warm and dry.     Findings: No erythema or rash.  Neurological:     Mental Status: She is alert.     Comments: Cranial nerves grossly intact. Patient moves extremities symmetrically and with good coordination.  Psychiatric:        Behavior: Behavior normal.        Thought Content: Thought content normal.        Judgment: Judgment normal.      ED Treatments / Results   Labs (all labs ordered are listed, but only abnormal results are displayed) Labs Reviewed  WET PREP, GENITAL - Abnormal; Notable for the following components:      Result Value   Clue Cells Wet Prep HPF POC PRESENT (*)    WBC, Wet Prep HPF POC FEW (*)    All other components within normal limits  COMPREHENSIVE METABOLIC PANEL - Abnormal; Notable for the following components:   CO2 21 (*)    Albumin 5.3 (*)    All other components within normal limits  URINALYSIS, ROUTINE W REFLEX MICROSCOPIC - Abnormal; Notable for the following components:   APPearance HAZY (*)    Ketones, ur 80 (*)    Protein, ur 30 (*)    All other components within normal limits  LIPASE, BLOOD  CBC WITH DIFFERENTIAL/PLATELET  RAPID HIV SCREEN (HIV 1/2 AB+AG)  MONONUCLEOSIS SCREEN  RPR  POC URINE PREG, ED  GC/CHLAMYDIA PROBE AMP (Center[facility]    EKG EKG Interpretation  Date/Time:  Monday Oct 10 2018 19:34:13 EDT Ventricular Rate:  52 PR Interval:    QRS Duration: 85 QT Interval:  450 QTC Calculation: 419 R Axis:   90 Text Interpretation:  Sinus rhythm Atrial premature complex Borderline right axis deviation No STEMI.  Confirmed by Alona BeneLong, Joshua [id number]) on 10/10/2018 7:51:52 PM   Radiology Ct Abdomen Pelvis W Contrast  Result Date: 10/10/2018 CLINICAL DATA:  Nausea, vomiting, abdominal pain EXAM: CT ABDOMEN AND PELVIS WITH CONTRAST TECHNIQUE: Multidetector CT imaging of the abdomen and pelvis was performed using the standard protocol following bolus administration of intravenous contrast. CONTRAST:  100mL OMNIPAQUE IOHEXOL 300 MG/ML  SOLN COMPARISON:  Pelvic ultrasound 12/15/2017.  CT 02/15/2013. FINDINGS: Lower chest: Lung bases are clear. No effusions. Heart is normal size. Hepatobiliary: No focal hepatic abnormality. Gallbladder unremarkable. Pancreas: No focal abnormality or ductal dilatation. Spleen: No  focal abnormality.  Normal size. Adrenals/Urinary Tract: No adrenal abnormality. No focal  renal abnormality. No stones or hydronephrosis. Urinary bladder is unremarkable. Stomach/Bowel: Appendix is visualized and is normal. Stomach, large and small bowel grossly unremarkable. Vascular/Lymphatic: No evidence of aneurysm or adenopathy. Reproductive: Uterus and adnexa unremarkable.  No mass. Other: No free fluid or free air. Musculoskeletal: No acute bony abnormality. IMPRESSION: No acute findings in the abdomen or pelvis. Normal appendix. Electronically Signed   By: Charlett Nose M.D.   On: 10/10/2018 20:43   Dg Chest Portable 1 View  Result Date: 10/10/2018 CLINICAL DATA:  Shortness of breath EXAM: PORTABLE CHEST 1 VIEW COMPARISON:  Chest x-ray dated 12/21/2016 FINDINGS: The heart size and mediastinal contours are within normal limits. Both lungs are clear. The visualized skeletal structures are unremarkable. IMPRESSION: No active disease. Electronically Signed   By: Katherine Mantle M.D.   On: 10/10/2018 19:15    Procedures Procedures (including critical care time)  Medications Ordered in ED Medications  sodium chloride flush (NS) 0.9 % injection 3 mL (has no administration in time range)  sodium chloride (PF) 0.9 % injection (has no administration in time range)  lidocaine (XYLOCAINE) 1 % (with pres) injection (has no administration in time range)  sodium chloride 0.9 % bolus 1,000 mL (0 mLs Intravenous Stopped 10/10/18 2145)  iohexol (OMNIPAQUE) 300 MG/ML solution 100 mL (100 mLs Intravenous Contrast Given 10/10/18 2018)  alum & mag hydroxide-simeth (MAALOX/MYLANTA) 200-200-20 MG/5ML suspension 30 mL (30 mLs Oral Given 10/10/18 2140)    And  lidocaine (XYLOCAINE) 2 % viscous mouth solution 15 mL (15 mLs Oral Given 10/10/18 2140)  dicyclomine (BENTYL) capsule 20 mg (20 mg Oral Given 10/10/18 2140)  pantoprazole (PROTONIX) EC tablet 40 mg (40 mg Oral Given 10/10/18 2141)  metroNIDAZOLE (FLAGYL) tablet 500 mg (500 mg Oral Given 10/10/18 2142)  doxycycline (VIBRA-TABS) tablet 100 mg (100 mg Oral  Given 10/10/18 2141)  cefTRIAXone (ROCEPHIN) injection 250 mg (250 mg Intramuscular Given 10/10/18 2142)  ondansetron (ZOFRAN-ODT) disintegrating tablet 4 mg (4 mg Oral Given 10/10/18 2141)     Initial Impression / Assessment and Plan / ED Course  I have reviewed the triage vital signs and the nursing notes.  Pertinent labs & imaging results that were available during my care of the patient were reviewed by me and considered in my medical decision making (see chart for details).  Clinical Course as of Oct 10 2230  Mon Oct 10, 2018  2034 C/w pt's history of not eating.   Ketones, ur(!): 80 [AM]  2231 Tolerating PO. In NAD. Stable for discharge.    [AM]    Clinical Course User Index [AM] Elisha Ponder, PA-C       This is a well-appearing 28 year old female with no chronic medical problems presenting.  Denies abdominal pain, pelvic pain, syncopal episode.  Differential diagnosis includes leukemia, lymphoma with bulky adenopathy in the abdomen and pelvis, other intra-abdominal neoplasm, appendicitis, gastroenteritis, pancreatitis, pyelonephritis, PID, IBS, IBD.  Chart reviewed, and appears the patient has had episodes of syncope previously.  Patient has a reassuring abdominal exam with no focal abdominal tenderness or surgical abdomen.  This appears to be an ongoing chronic pain issue that is worsening and may have acute on chronic exacerbation of chronic process.   Work-up is overall very reassuring.  Patient is not pregnant.  Patient has a completely normal CBC with differential, and CMP significant only for slight elevation in albumin.  Urinalysis without evidence of infection, but  she does have ketones in the urine consistent with several days of low appetite.  Mononucleosis screen is negative.  Rapid HIV is negative.  Wet prep demonstrating clue cells and few WBCs but no other abnormalities.  Given the amount of discharge and her pain on exam, will treat for PID.  CT of abdomen and pelvis  demonstrates no evidence of intra-abdominal abnormality.  EKG normal sinus rhythm without evidence of conduction abnormality.  Given overall reassuring work-up, high suspicion for gastritis, IBS or IBD.  Also concerned about PID given acute worsening symptoms and her history of PID.  Will treat with doxycycline, Flagyl, and single intramuscular Rocephin.  Patient prescribed Protonix, Carafate, and Bentyl.  She is referred to gastroenterology and OB/GYN.  She is given return precautions for any worsening pain, intractable nausea or vomiting, hematemesis, melena or hematochezia.  Patient is in understanding and agrees with plan of care.  Patient may neurologically intact, with no events on the monitor.  Suspect that patient's syncopal episode was secondary to pain, and lack of eating.  Reviewing chart, patient has had prior episodes of similar.  She is tolerating p.o. here in the emergency department and feeling much better.  This is a supervised visit with Dr. Alona Bene. Evaluation, management, and discharge planning discussed with this attending physician.  Final Clinical Impressions(s) / ED Diagnoses   Final diagnoses:  Generalized abdominal pain  Pelvic pain  Vaginal discharge    ED Discharge Orders         Ordered    doxycycline (VIBRAMYCIN) 100 MG capsule  2 times daily     10/10/18 2243    metroNIDAZOLE (FLAGYL) 500 MG tablet  2 times daily     10/10/18 2243    pantoprazole (PROTONIX) 20 MG tablet  2 times daily     10/10/18 2243    sucralfate (CARAFATE) 1 g tablet  3 times daily with meals & bedtime     10/10/18 2243    dicyclomine (BENTYL) 20 MG tablet  2 times daily     10/10/18 2243           Delia Chimes 10/10/18 2255    Maia Plan, MD 10/12/18 1313

## 2018-10-10 NOTE — ED Triage Notes (Signed)
States abdominal pain for about 3 weeks in midline abdomen with nausea and vomiting and vaginal discharge white in color and bulge on her left base of neck for a few months and a bulge in her vaginal area for about 1.5 weeks no dysuria voiced.  And states passed out at home and mom had to help her up states lasted for about 10 seconds.  No fever at present.

## 2018-10-11 LAB — RPR: RPR Ser Ql: NONREACTIVE

## 2018-10-11 LAB — GC/CHLAMYDIA PROBE AMP (~~LOC~~) NOT AT ARMC
Chlamydia: NEGATIVE
Neisseria Gonorrhea: NEGATIVE

## 2018-10-19 ENCOUNTER — Telehealth: Payer: BLUE CROSS/BLUE SHIELD | Admitting: Nurse Practitioner

## 2018-10-19 DIAGNOSIS — Z20822 Contact with and (suspected) exposure to covid-19: Secondary | ICD-10-CM

## 2018-10-19 NOTE — Progress Notes (Signed)
  E-Visit for Corona Virus Screening  Based on what you have shared with me, you need to seek an evaluation for a severe illness that is causing your symptoms which may be coronavirus or some other illness. I recommend that you be seen and evaluated "face to face". Our Emergency Departments are best equipped to handle patients with severe symptoms.  You will be evaluated by the ER provider (or higher level of care provider) who will determine whether you need formal testing.   I recommend the following:  . If you are having a true medical emergency please call 911. . If you are considered high risk for Corona virus because of a known exposure, fever, shortness of breath and cough, OR if you have severe symptoms of any kind, seek medical care at an emergency room.   . [facility] Emergency Department [address], [place], [place] [address] [phone]  . [facility] [place] Emergency Department [address], [place], [place] [address] [phone]  . [facility] Emergency Department [address], [place], [place] [address] [phone]  . [facility] Emergency Department [address], [place], [place] [address] [phone]  . [facility] Emergency Department [address], [place], [place] [address] [phone]  NOTE: If you entered your credit card information for this eVisit, you will not be charged. You may see a "hold" on your card for the $35 but that hold will drop off and you will not have a charge processed.   Your e-visit answers were reviewed by a board certified advanced clinical practitioner to complete your personal care plan.  Thank you for using e-Visits.  

## 2018-11-06 ENCOUNTER — Emergency Department (HOSPITAL_COMMUNITY)
Admission: EM | Admit: 2018-11-06 | Discharge: 2018-11-06 | Disposition: A | Discharge status: Home / Self Care | Payer: BLUE CROSS/BLUE SHIELD | Attending: Emergency Medicine | Admitting: Emergency Medicine

## 2018-11-06 ENCOUNTER — Encounter (HOSPITAL_COMMUNITY): Payer: Self-pay | Admitting: Emergency Medicine

## 2018-11-06 ENCOUNTER — Other Ambulatory Visit: Payer: Self-pay

## 2018-11-06 DIAGNOSIS — Z87891 Personal history of nicotine dependence: Secondary | ICD-10-CM | POA: Insufficient documentation

## 2018-11-06 DIAGNOSIS — Z79899 Other long term (current) drug therapy: Secondary | ICD-10-CM | POA: Insufficient documentation

## 2018-11-06 DIAGNOSIS — J45909 Unspecified asthma, uncomplicated: Secondary | ICD-10-CM | POA: Diagnosis not present

## 2018-11-06 DIAGNOSIS — N939 Abnormal uterine and vaginal bleeding, unspecified: Secondary | ICD-10-CM | POA: Diagnosis present

## 2018-11-06 LAB — CBC
HCT: 42.3 % (ref 36.0–46.0)
Hemoglobin: 14.4 g/dL (ref 12.0–15.0)
MCH: 31.9 pg (ref 26.0–34.0)
MCHC: 34 g/dL (ref 30.0–36.0)
MCV: 93.8 fL (ref 80.0–100.0)
Platelets: 232 10*3/uL (ref 150–400)
RBC: 4.51 MIL/uL (ref 3.87–5.11)
RDW: 12.3 % (ref 11.5–15.5)
WBC: 6 10*3/uL (ref 4.0–10.5)
nRBC: 0 % (ref 0.0–0.2)

## 2018-11-06 LAB — I-STAT BETA HCG BLOOD, ED (MC, WL, AP ONLY): I-stat hCG, quantitative: <5 m[IU]/mL (ref <5)

## 2018-11-06 NOTE — ED Provider Notes (Signed)
[facility]-EMERGENCY DEPT Provider Note   CSN: [id number] Arrival date & time: 11/06/18  0155    History   Chief Complaint Chief Complaint  Patient presents with  . Vaginal Bleeding    HPI Melanie Galloway is a 28 y.o. female.     The history is provided by the patient.  Vaginal Bleeding  Quality:  Bright red Severity:  Severe Onset quality:  Gradual Duration:  12 hours Timing:  Intermittent Progression:  Worsening Chronicity:  New Relieved by:  Nothing Worsened by:  Nothing Associated symptoms: no fever   Presents with vaginal bleeding.  She reports she is soaking a super tampon every 30 minutes the past 12 hours She reports abdominal cramping is improving.  No fevers or vomiting Reports recent lymphadenopathy, plans to see PCP About this soon  Past Medical History:  Diagnosis Date  . Asthma   . Migraines   . Stomach ulcer     Patient Active Problem List   Diagnosis Date Noted  . 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 SVD (spontaneous vaginal delivery) 12/08/2013  . Anxiety 09/05/2013  . Depression 09/05/2013  . Chronic headache 07/04/2013  . Abnormal maternal serum screening test 06/13/2013  . UTI (urinary tract infection) in pregnancy in first trimester 06/06/2013  . Supervision of normal first pregnancy 05/09/2013  . Asthma complicating pregnancy, antepartum 05/09/2013    Past Surgical History:  Procedure Laterality Date  . EXTERNAL EAR SURGERY    . EYE MUSCLE SURGERY    . EYE SURGERY     left      OB History    Gravida  2   Para  1   Term  1   Preterm      AB  1   Living  1     SAB  1   TAB      Ectopic      Multiple      Live Births  1            Home Medications    Prior to Admission medications   Medication Sig Start Date End Date Taking? Authorizing Provider  butalbital-acetaminophen-caffeine (FIORICET) 50-325-40 MG tablet Take 1 tablet by mouth daily as needed for migraine. 09/26/18    [provider]  dicyclomine (BENTYL) 20 MG tablet Take 1 tablet (20 mg total) by mouth 2 (two) times daily. 10/10/18   Aviva Kluver B, PA-C  pantoprazole (PROTONIX) 20 MG tablet Take 1 tablet (20 mg total) by mouth 2 (two) times daily for 21 days. 10/10/18 10/31/18  Aviva Kluver B, PA-C  sucralfate (CARAFATE) 1 g tablet Take 1 tablet (1 g total) by mouth 4 (four) times daily -  with meals and at bedtime for 14 days. 10/10/18 10/24/18  Elisha Ponder, PA-C    Family History Family History  Problem Relation Age of Onset  . Cancer Mother   . Crohn's disease Mother   . Asthma Sister     Social History Social History   Tobacco Use  . Smoking status: Former Smoker    Packs/day: 1.00    Types: Cigarettes    Last attempt to quit: 06/2017    Years since quitting: 1.4  . Smokeless tobacco: Never Used  Substance Use Topics  . Alcohol use: Yes    Comment: Socially  . Drug use: Not Currently    Types: Marijuana    Comment: 2014     Allergies   Toradol [ketorolac tromethamine] and Fish  allergy   Review of Systems Review of Systems  Constitutional: Negative for fever.  Gastrointestinal: Negative for vomiting.  Genitourinary: Positive for vaginal bleeding.  Psychiatric/Behavioral: The patient is nervous/anxious.   All other systems reviewed and are negative.    Physical Exam Updated Vital Signs BP 105/62 (BP Location: Left Arm)   Pulse 80   Temp 98.5 F (36.9 C) (Oral)   Resp 16   Ht 1.626 m (5\' 4" )   Wt 47.3 kg   LMP 11/06/2018   SpO2 99%   BMI 17.89 kg/m   Physical Exam CONSTITUTIONAL: Well developed/well nourished, anxious HEAD: Normocephalic/atraumatic EYES: EOMI ENMT: Mucous membranes moist NECK: supple no meningeal signs CV: S1/S2 noted  LUNGS:   no apparent distress ABDOMEN: soft, nontender  GU: Female Chaperone present-small amount of vaginal bleeding.  No lacerations.  No discharge.  No CMT. NEURO: Pt is awake/alert/appropriate, moves all  extremitiesx4.  No facial droop.   EXTREMITIES:  full ROM SKIN: warm, color normal PSYCH: Anxious  ED Treatments / Results  Labs (all labs ordered are listed, but only abnormal results are displayed) Labs Reviewed  CBC  I-STAT BETA HCG BLOOD, ED ([facility], [facility], [facility] ONLY)    EKG None  Radiology No results found.  Procedures Procedures    Medications Ordered in ED Medications - No data to display   Initial Impression / Assessment and Plan / ED Course  I have reviewed the triage vital signs and the nursing notes.  Pertinent labs  results that were available during my care of the patient were reviewed by me and considered in my medical decision making (see chart for details).        Hemoglobin stable.  She is not pregnant.  No significant bleeding on my exam.  Will discharge home  Final Clinical Impressions(s) / ED Diagnoses   Final diagnoses:  Vaginal bleeding    ED Discharge Orders    None       Zadie RhineWickline, Merinda Victorino, MD 11/06/18 [phone]

## 2018-11-06 NOTE — ED Notes (Signed)
Pt informed that we were still awaiting an available room at this time and would notify her when it was available.

## 2018-11-06 NOTE — ED Notes (Signed)
Bed: WA23 Expected date:  Expected time:  Means of arrival:  Comments: 

## 2018-11-06 NOTE — ED Triage Notes (Addendum)
Pt reports that she was going through a super tampon every 30 minutes since 6pm tonight. Pt not sure if she may have been pregnant. And previous period was May 7th.

## 2018-11-07 ENCOUNTER — Other Ambulatory Visit: Payer: Self-pay

## 2018-11-07 ENCOUNTER — Encounter (HOSPITAL_BASED_OUTPATIENT_CLINIC_OR_DEPARTMENT_OTHER): Payer: Self-pay | Admitting: *Deleted

## 2018-11-07 ENCOUNTER — Emergency Department (HOSPITAL_BASED_OUTPATIENT_CLINIC_OR_DEPARTMENT_OTHER)
Admission: EM | Admit: 2018-11-07 | Discharge: 2018-11-07 | Disposition: A | Discharge status: Home / Self Care | Payer: BLUE CROSS/BLUE SHIELD | Attending: Emergency Medicine | Admitting: Emergency Medicine

## 2018-11-07 ENCOUNTER — Emergency Department (HOSPITAL_BASED_OUTPATIENT_CLINIC_OR_DEPARTMENT_OTHER): Payer: BLUE CROSS/BLUE SHIELD

## 2018-11-07 DIAGNOSIS — Y999 Unspecified external cause status: Secondary | ICD-10-CM | POA: Insufficient documentation

## 2018-11-07 DIAGNOSIS — S0990XA Unspecified injury of head, initial encounter: Secondary | ICD-10-CM | POA: Diagnosis present

## 2018-11-07 DIAGNOSIS — Z79899 Other long term (current) drug therapy: Secondary | ICD-10-CM | POA: Insufficient documentation

## 2018-11-07 DIAGNOSIS — J45909 Unspecified asthma, uncomplicated: Secondary | ICD-10-CM | POA: Diagnosis not present

## 2018-11-07 DIAGNOSIS — Z87891 Personal history of nicotine dependence: Secondary | ICD-10-CM | POA: Insufficient documentation

## 2018-11-07 DIAGNOSIS — W2209XA Striking against other stationary object, initial encounter: Secondary | ICD-10-CM | POA: Diagnosis not present

## 2018-11-07 DIAGNOSIS — Y939 Activity, unspecified: Secondary | ICD-10-CM | POA: Insufficient documentation

## 2018-11-07 DIAGNOSIS — S060X0A Concussion without loss of consciousness, initial encounter: Secondary | ICD-10-CM | POA: Insufficient documentation

## 2018-11-07 DIAGNOSIS — Y9259 Other trade areas as the place of occurrence of the external cause: Secondary | ICD-10-CM | POA: Diagnosis not present

## 2018-11-07 NOTE — ED Notes (Signed)
Patient transported to CT 

## 2018-11-07 NOTE — ED Triage Notes (Addendum)
Pt states that she hit her head on the door lock of the hotel where she is staying. States it was a hard impact. C/o of feeling dizzy, blurred vision, and the right side of her face feels numb. C/o nausea. Denies any vomiting. States she was texting after and after re-reading her text it made no sense so we wanted to be evaluated. Pupils 2 brisk bilateral. No obvious neuro deficits noted. Small red tender area noted the right side of her head. Pt states she was seen yesterday at [facility] ED for a "fall" Pt is alert and oriented times 3 at present.

## 2018-11-07 NOTE — ED Provider Notes (Signed)
[facility] EMERGENCY DEPARTMENT Provider Note   CSN: [id number] Arrival date & time: 11/07/18  0225    History   Chief Complaint Chief Complaint  Patient presents with  . head injury    HPI Melanie Galloway is a 28 y.o. female.     HPI  This is a 28 year old female with a history of asthma and migraines who presents with headache and confusion.  Patient reports that she hit her head approximately 2 hours prior to arrival.  She states that she hit it hard on the deadbolt of a door.  She did not lose consciousness.  Since that time she reports dizziness and blurry vision of the right eye as well as some numbness of the right side of the face.  Additionally, she states that she was texting and "my texts did not make sense."  She denies any nausea or vomiting.  She denies any speech difficulties or focal neurologic deficits.  Denies any significant headache.  Of note, she states she was evaluated yesterday morning at [facility] after a fall at work.  However, on chart review, it appears she was evaluated for vaginal bleeding.  She is adamant that she told them she fell and that is why she was evaluated.  I do not see any confirmation of this in her chart.  She is alert and oriented x3 and able to provide history.  She denies any fevers, cough, chest pain, abdominal pain, nausea, vomiting, urinary symptoms.  He denies alcohol or drug use.  Past Medical History:  Diagnosis Date  . Asthma   . Migraines   . Stomach ulcer     Patient Active Problem List   Diagnosis Date Noted  . 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 SVD (spontaneous vaginal delivery) 12/08/2013  . Anxiety 09/05/2013  . Depression 09/05/2013  . Chronic headache 07/04/2013  . Abnormal maternal serum screening test 06/13/2013  . UTI (urinary tract infection) in pregnancy in first trimester 06/06/2013  . Supervision of normal first pregnancy 05/09/2013  . Asthma complicating pregnancy, antepartum  05/09/2013    Past Surgical History:  Procedure Laterality Date  . EXTERNAL EAR SURGERY    . EYE MUSCLE SURGERY    . EYE SURGERY     left      OB History    Gravida  2   Para  1   Term  1   Preterm      AB  1   Living  1     SAB  1   TAB      Ectopic      Multiple      Live Births  1            Home Medications    Prior to Admission medications   Medication Sig Start Date End Date Taking? Authorizing Provider  butalbital-acetaminophen-caffeine (FIORICET) 50-325-40 MG tablet Take 1 tablet by mouth daily as needed for migraine. 09/26/18   [provider]  dicyclomine (BENTYL) 20 MG tablet Take 1 tablet (20 mg total) by mouth 2 (two) times daily. 10/10/18   Aviva Kluver B, PA-C  pantoprazole (PROTONIX) 20 MG tablet Take 1 tablet (20 mg total) by mouth 2 (two) times daily for 21 days. 10/10/18 10/31/18  Aviva Kluver B, PA-C  sucralfate (CARAFATE) 1 g tablet Take 1 tablet (1 g total) by mouth 4 (four) times daily -  with meals and at bedtime for 14 days. 10/10/18 10/24/18  Elisha Ponder,  PA-C    Family History Family History  Problem Relation Age of Onset  . Cancer Mother   . Crohn's disease Mother   . Asthma Sister     Social History Social History   Tobacco Use  . Smoking status: Former Smoker    Packs/day: 1.00    Types: Cigarettes    Last attempt to quit: 06/2017    Years since quitting: 1.4  . Smokeless tobacco: Never Used  Substance Use Topics  . Alcohol use: Yes    Comment: Socially  . Drug use: Not Currently    Types: Marijuana    Comment: 2014     Allergies   Toradol [ketorolac tromethamine] and Fish allergy   Review of Systems Review of Systems  Constitutional: Negative for fever.  Respiratory: Negative for shortness of breath.   Cardiovascular: Negative for chest pain.  Gastrointestinal: Negative for abdominal pain, nausea and vomiting.  Neurological: Positive for dizziness and headaches.  Psychiatric/Behavioral:  Positive for confusion.  All other systems reviewed and are negative.    Physical Exam Updated Vital Signs BP 113/86   Pulse 95   Temp 98.2 F (36.8 C)   Resp 18   Ht 1.626 m (5\' 4" )   Wt 45.8 kg   LMP 11/06/2018 (Exact Date)   SpO2 96%   BMI 17.34 kg/m   Physical Exam Vitals signs and nursing note reviewed.  Constitutional:      Appearance: She is well-developed.  HENT:     Head: Normocephalic and atraumatic.     Comments: Tenderness temporal region without hematoma    Ears:     Comments: No evidence of hemotympanum    Mouth/Throat:     Mouth: Mucous membranes are moist.  Eyes:     Pupils: Pupils are equal, round, and reactive to light.     Comments: Pupils7 mm and reactive bilaterally  Neck:     Musculoskeletal: Neck supple. No muscular tenderness.  Cardiovascular:     Rate and Rhythm: Normal rate and regular rhythm.     Heart sounds: Normal heart sounds.  Pulmonary:     Effort: Pulmonary effort is normal. No respiratory distress.     Breath sounds: No wheezing.  Abdominal:     General: Bowel sounds are normal.     Palpations: Abdomen is soft.  Skin:    General: Skin is warm and dry.  Neurological:     Mental Status: She is alert and oriented to person, place, and time.     Comments: Cranial nerves II through XII intact, 5 out of 5 strength in all 4 extremities, no dysmetria to finger-nose-finger, normal gait      ED Treatments / Results  Labs (all labs ordered are listed, but only abnormal results are displayed) Labs Reviewed - No data to display  EKG None  Radiology Ct Head Wo Contrast  Result Date: 11/07/2018 CLINICAL DATA:  Head trauma EXAM: CT HEAD WITHOUT CONTRAST TECHNIQUE: Contiguous axial images were obtained from the base of the skull through the vertex without intravenous contrast. COMPARISON:  12/21/2016 FINDINGS: Brain: No acute territorial infarction, hemorrhage, or intracranial mass. Nonenlarged ventricles. Vascular: No hyperdense vessel  or unexpected calcification. Skull: Normal. Negative for fracture or focal lesion. Sinuses/Orbits: No acute finding. Other: None IMPRESSION: Negative non contrasted CT appearance of the brain Electronically Signed   By: Jasmine Pang M.D.   On: 11/07/2018 03:36    Procedures Procedures (including critical care time)  Medications Ordered in ED Medications - No  data to display   Initial Impression / Assessment and Plan / ED Course  I have reviewed the triage vital signs and the nursing notes.  Pertinent labs & imaging results that were available during my care of the patient were reviewed by me and considered in my medical decision making (see chart for details).        Patient presents with confusion and dizziness as well as blurry vision after hitting her head.  She is overall nontoxic and she is neurologically intact.  No focal neuro deficits on exam.  She is ambulatory independently.  Vital signs are reassuring.  She is relatively low risk for acute brain injury.  However, we discussed at length that [facility] with what she tells me today.  Difficult to assess whether this is an amnestic event.  Because of this, CT head was obtained.  CT shows no evidence of mass or bleed.  Patient reassured.  Given her symptoms, would treat her for a mild concussion.  Recommend pain control and rest.  After history, exam, and medical workup I feel the patient has been appropriately medically screened and is safe for discharge home. Pertinent diagnoses were discussed with the patient. Patient was given return precautions.   Final Clinical Impressions(s) / ED Diagnoses   Final diagnoses:  Concussion without loss of consciousness, initial encounter    ED Discharge Orders    None       Horton, Mayer Maskerourtney F, MD 11/07/18 [phone]
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
## 2018-11-07 NOTE — ED Notes (Signed)
ED Provider at bedside. 

## 2018-11-07 NOTE — ED Notes (Signed)
Pt understood dc material. NAD noted. All questions answered to satisfaction. Pt escorted to check out counter. 
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
## 2020-02-25 ENCOUNTER — Other Ambulatory Visit: Payer: Self-pay

## 2020-02-25 ENCOUNTER — Encounter (HOSPITAL_BASED_OUTPATIENT_CLINIC_OR_DEPARTMENT_OTHER): Payer: Self-pay | Admitting: Emergency Medicine

## 2020-02-25 ENCOUNTER — Emergency Department (HOSPITAL_BASED_OUTPATIENT_CLINIC_OR_DEPARTMENT_OTHER)
Admission: EM | Admit: 2020-02-25 | Discharge: 2020-02-25 | Disposition: A | Discharge status: Home / Self Care | Payer: BLUE CROSS/BLUE SHIELD | Attending: Emergency Medicine | Admitting: Emergency Medicine

## 2020-02-25 DIAGNOSIS — R1032 Left lower quadrant pain: Secondary | ICD-10-CM | POA: Diagnosis not present

## 2020-02-25 DIAGNOSIS — O4690 Antepartum hemorrhage, unspecified, unspecified trimester: Secondary | ICD-10-CM | POA: Diagnosis present

## 2020-02-25 DIAGNOSIS — Z3A00 Weeks of gestation of pregnancy not specified: Secondary | ICD-10-CM | POA: Diagnosis not present

## 2020-02-25 DIAGNOSIS — O98519 Other viral diseases complicating pregnancy, unspecified trimester: Secondary | ICD-10-CM | POA: Insufficient documentation

## 2020-02-25 DIAGNOSIS — N939 Abnormal uterine and vaginal bleeding, unspecified: Secondary | ICD-10-CM | POA: Diagnosis not present

## 2020-02-25 DIAGNOSIS — U071 COVID-19: Secondary | ICD-10-CM | POA: Diagnosis not present

## 2020-02-25 DIAGNOSIS — Z5321 Procedure and treatment not carried out due to patient leaving prior to being seen by health care provider: Secondary | ICD-10-CM | POA: Diagnosis not present

## 2020-02-25 LAB — CBC WITH DIFFERENTIAL/PLATELET
Abs Immature Granulocytes: 0 10*3/uL (ref 0.00–0.07)
Basophils Absolute: 0 10*3/uL (ref 0.0–0.1)
Basophils Relative: 0 %
Eosinophils Absolute: 0 10*3/uL (ref 0.0–0.5)
Eosinophils Relative: 1 %
HCT: 43.8 % (ref 36.0–46.0)
Hemoglobin: 14.5 g/dL (ref 12.0–15.0)
Immature Granulocytes: 0 %
Lymphocytes Relative: 41 %
Lymphs Abs: 1.5 10*3/uL (ref 0.7–4.0)
MCH: 30.4 pg (ref 26.0–34.0)
MCHC: 33.1 g/dL (ref 30.0–36.0)
MCV: 91.8 fL (ref 80.0–100.0)
Monocytes Absolute: 0.4 10*3/uL (ref 0.1–1.0)
Monocytes Relative: 11 %
Neutro Abs: 1.8 10*3/uL (ref 1.7–7.7)
Neutrophils Relative %: 47 %
Platelets: 213 10*3/uL (ref 150–400)
RBC: 4.77 MIL/uL (ref 3.87–5.11)
RDW: 12.8 % (ref 11.5–15.5)
WBC: 3.7 10*3/uL — ABNORMAL LOW (ref 4.0–10.5)
nRBC: 0 % (ref 0.0–0.2)

## 2020-02-25 LAB — BASIC METABOLIC PANEL
Anion gap: 11 (ref 5–15)
BUN: 7 mg/dL (ref 6–20)
CO2: 27 mmol/L (ref 22–32)
Calcium: 8.9 mg/dL (ref 8.9–10.3)
Chloride: 98 mmol/L (ref 98–111)
Creatinine, Ser: 0.74 mg/dL (ref 0.44–1.00)
GFR calc Af Amer: >60 mL/min (ref >60)
GFR calc non Af Amer: >60 mL/min (ref >60)
Glucose, Bld: 87 mg/dL (ref 70–99)
Potassium: 3.5 mmol/L (ref 3.5–5.1)
Sodium: 136 mmol/L (ref 135–145)

## 2020-02-25 LAB — HCG, QUANTITATIVE, PREGNANCY: hCG, Beta Chain, Quant, S: <1 m[IU]/mL (ref <5)

## 2020-02-25 NOTE — ED Triage Notes (Signed)
Pt endorses LLQ pain with vaginal bleeding, feels like something "happened" and bleeding increased. Pt reports changing 3 pads/hr. Pt also reports COVID +, reports scheduled appt with gynecologist Thursday but not able to be seen d/t Covid status. AOx4, ambulatory to triage, no s/s of distress

## 2020-02-25 NOTE — ED Notes (Signed)
Covid + since 09/14

## 2020-02-25 NOTE — ED Notes (Signed)
Per Dr. Lockie Mola, waiting to perform U/S till after patient is put in ED room due to COVID status.  U/S will be performed portably.

## 2020-05-29 DIAGNOSIS — Z1231 Encounter for screening mammogram for malignant neoplasm of breast: Secondary | ICD-10-CM

## 2020-09-06 ENCOUNTER — Emergency Department (HOSPITAL_COMMUNITY)
Admission: EM | Admit: 2020-09-06 | Discharge: 2020-09-07 | Disposition: A | Discharge status: Home / Self Care | Payer: Medicaid Other | Attending: Emergency Medicine | Admitting: Emergency Medicine

## 2020-09-06 ENCOUNTER — Encounter (HOSPITAL_COMMUNITY): Payer: Self-pay

## 2020-09-06 ENCOUNTER — Emergency Department (HOSPITAL_COMMUNITY): Payer: Medicaid Other

## 2020-09-06 ENCOUNTER — Other Ambulatory Visit: Payer: Self-pay

## 2020-09-06 DIAGNOSIS — Z87891 Personal history of nicotine dependence: Secondary | ICD-10-CM | POA: Insufficient documentation

## 2020-09-06 DIAGNOSIS — S01112A Laceration without foreign body of left eyelid and periocular area, initial encounter: Secondary | ICD-10-CM | POA: Diagnosis not present

## 2020-09-06 DIAGNOSIS — J45909 Unspecified asthma, uncomplicated: Secondary | ICD-10-CM | POA: Insufficient documentation

## 2020-09-06 DIAGNOSIS — Z23 Encounter for immunization: Secondary | ICD-10-CM | POA: Insufficient documentation

## 2020-09-06 DIAGNOSIS — F1910 Other psychoactive substance abuse, uncomplicated: Secondary | ICD-10-CM | POA: Insufficient documentation

## 2020-09-06 DIAGNOSIS — R079 Chest pain, unspecified: Secondary | ICD-10-CM | POA: Diagnosis not present

## 2020-09-06 DIAGNOSIS — Y908 Blood alcohol level of 240 mg/100 ml or more: Secondary | ICD-10-CM | POA: Diagnosis not present

## 2020-09-06 DIAGNOSIS — T1490XA Injury, unspecified, initial encounter: Secondary | ICD-10-CM

## 2020-09-06 DIAGNOSIS — R4020 Unspecified coma: Secondary | ICD-10-CM | POA: Diagnosis not present

## 2020-09-06 DIAGNOSIS — W228XXA Striking against or struck by other objects, initial encounter: Secondary | ICD-10-CM | POA: Diagnosis not present

## 2020-09-06 DIAGNOSIS — T07XXXA Unspecified multiple injuries, initial encounter: Secondary | ICD-10-CM

## 2020-09-06 DIAGNOSIS — S0592XA Unspecified injury of left eye and orbit, initial encounter: Secondary | ICD-10-CM | POA: Diagnosis present

## 2020-09-06 LAB — COMPREHENSIVE METABOLIC PANEL
ALT: 18 U/L (ref 0–44)
AST: 24 U/L (ref 15–41)
Albumin: 4.4 g/dL (ref 3.5–5.0)
Alkaline Phosphatase: 48 U/L (ref 38–126)
Anion gap: 11 (ref 5–15)
BUN: 5 mg/dL — ABNORMAL LOW (ref 6–20)
CO2: 23 mmol/L (ref 22–32)
Calcium: 9.1 mg/dL (ref 8.9–10.3)
Chloride: 110 mmol/L (ref 98–111)
Creatinine, Ser: 0.79 mg/dL (ref 0.44–1.00)
GFR, Estimated: >60 mL/min (ref >60)
Glucose, Bld: 108 mg/dL — ABNORMAL HIGH (ref 70–99)
Potassium: 3.3 mmol/L — ABNORMAL LOW (ref 3.5–5.1)
Sodium: 144 mmol/L (ref 135–145)
Total Bilirubin: 0.6 mg/dL (ref 0.3–1.2)
Total Protein: 7.2 g/dL (ref 6.5–8.1)

## 2020-09-06 LAB — ETHANOL: Alcohol, Ethyl (B): 240 mg/dL — ABNORMAL HIGH (ref <10)

## 2020-09-06 LAB — RAPID URINE DRUG SCREEN, HOSP PERFORMED
Amphetamines: NOT DETECTED
Barbiturates: NOT DETECTED
Benzodiazepines: NOT DETECTED
Cocaine: POSITIVE — AB
Opiates: NOT DETECTED
Tetrahydrocannabinol: POSITIVE — AB

## 2020-09-06 LAB — CBC WITH DIFFERENTIAL/PLATELET
Abs Immature Granulocytes: 0.02 10*3/uL (ref 0.00–0.07)
Basophils Absolute: 0 10*3/uL (ref 0.0–0.1)
Basophils Relative: 1 %
Eosinophils Absolute: 0 10*3/uL (ref 0.0–0.5)
Eosinophils Relative: 1 %
HCT: 43 % (ref 36.0–46.0)
Hemoglobin: 14.2 g/dL (ref 12.0–15.0)
Immature Granulocytes: 0 %
Lymphocytes Relative: 20 %
Lymphs Abs: 1.3 10*3/uL (ref 0.7–4.0)
MCH: 30.8 pg (ref 26.0–34.0)
MCHC: 33 g/dL (ref 30.0–36.0)
MCV: 93.3 fL (ref 80.0–100.0)
Monocytes Absolute: 0.3 10*3/uL (ref 0.1–1.0)
Monocytes Relative: 5 %
Neutro Abs: 4.8 10*3/uL (ref 1.7–7.7)
Neutrophils Relative %: 73 %
Platelets: 287 10*3/uL (ref 150–400)
RBC: 4.61 MIL/uL (ref 3.87–5.11)
RDW: 12.7 % (ref 11.5–15.5)
WBC: 6.6 10*3/uL (ref 4.0–10.5)
nRBC: 0 % (ref 0.0–0.2)

## 2020-09-06 LAB — I-STAT BETA HCG BLOOD, ED (MC, WL, AP ONLY): I-stat hCG, quantitative: <5 m[IU]/mL (ref <5)

## 2020-09-06 MED ORDER — STERILE WATER FOR INJECTION IJ SOLN
INTRAMUSCULAR | Status: AC
  Filled 2020-09-06: qty 10

## 2020-09-06 MED ORDER — TETANUS-DIPHTH-ACELL PERTUSSIS 5-2.5-18.5 LF-MCG/0.5 IM SUSY
0.5000 mL | PREFILLED_SYRINGE | Freq: Once | INTRAMUSCULAR | Status: AC
  Administered 2020-09-06: 0.5 mL via INTRAMUSCULAR
  Filled 2020-09-06: qty 0.5

## 2020-09-06 MED ORDER — LIDOCAINE-EPINEPHRINE 1 %-1:100000 IJ SOLN: 20.0000 mL | Freq: Once | INTRAMUSCULAR | Status: DC

## 2020-09-06 MED ORDER — ZIPRASIDONE MESYLATE 20 MG IM SOLR
20.0000 mg | Freq: Once | INTRAMUSCULAR | Status: AC
  Administered 2020-09-06: 20 mg via INTRAMUSCULAR
  Filled 2020-09-06: qty 20

## 2020-09-06 MED ORDER — SODIUM CHLORIDE 0.9 % IV BOLUS
1000.0000 mL | Freq: Once | INTRAVENOUS | Status: AC
  Administered 2020-09-06: 1000 mL via INTRAVENOUS

## 2020-09-06 MED ORDER — IOHEXOL 300 MG/ML  SOLN
100.0000 mL | Freq: Once | INTRAMUSCULAR | Status: AC | PRN
  Administered 2020-09-06: 100 mL via INTRAVENOUS
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
## 2020-09-06 NOTE — ED Notes (Signed)
Pt is asleep at this time.
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
## 2020-09-07 NOTE — ED Notes (Signed)
Pt ambulated to bathroom without difficulty and denied any symptoms.

## 2020-09-27 ENCOUNTER — Other Ambulatory Visit: Payer: Medicaid Other | Admitting: Orthopedic Surgery

## 2020-09-27 ENCOUNTER — Other Ambulatory Visit: Payer: Self-pay | Admitting: Orthopedic Surgery

## 2020-09-27 DIAGNOSIS — M79642 Pain in left hand: Secondary | ICD-10-CM

## 2020-09-30 ENCOUNTER — Encounter: Payer: Self-pay | Admitting: Neurology

## 2020-10-22 ENCOUNTER — Inpatient Hospital Stay: Admission: RE | Admit: 2020-10-22 | Payer: Self-pay | Source: Ambulatory Visit

## 2020-10-22 ENCOUNTER — Other Ambulatory Visit: Payer: Self-pay
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
## 2020-12-05 ENCOUNTER — Other Ambulatory Visit: Payer: Self-pay

## 2020-12-05 ENCOUNTER — Encounter: Payer: Self-pay | Admitting: Neurology

## 2020-12-05 ENCOUNTER — Ambulatory Visit: Payer: Medicaid Other | Admitting: Neurology

## 2020-12-05 VITALS — BP 109/72 | HR 74 | Resp 18 | Ht 64.0 in | Wt 112.0 lb

## 2020-12-05 DIAGNOSIS — G43709 Chronic migraine without aura, not intractable, without status migrainosus: Secondary | ICD-10-CM

## 2020-12-05 DIAGNOSIS — F419 Anxiety disorder, unspecified: Secondary | ICD-10-CM | POA: Diagnosis not present

## 2020-12-05 DIAGNOSIS — R42 Dizziness and giddiness: Secondary | ICD-10-CM | POA: Diagnosis not present

## 2020-12-05 DIAGNOSIS — F32A Depression, unspecified: Secondary | ICD-10-CM

## 2020-12-05 DIAGNOSIS — S069X9S Unspecified intracranial injury with loss of consciousness of unspecified duration, sequela: Secondary | ICD-10-CM | POA: Diagnosis not present

## 2020-12-05 MED ORDER — ONDANSETRON HCL 4 MG PO TABS: 4.0000 mg | ORAL_TABLET | Freq: Three times a day (TID) | ORAL | 5 refills | Status: AC | PRN

## 2020-12-05 MED ORDER — RIZATRIPTAN BENZOATE 10 MG PO TABS: ORAL_TABLET | ORAL | 5 refills | Status: DC

## 2020-12-05 MED ORDER — AIMOVIG 140 MG/ML ~~LOC~~ SOAJ: 140.0000 mg | SUBCUTANEOUS | 5 refills | Status: AC

## 2020-12-05 NOTE — Patient Instructions (Signed)
  Start Aimovig 140mg  injection every 28 days - keep a calendar to mark every 28 days so you know when to give yourself the next injection Take rizatriptan at earliest onset of headache.  May repeat dose once in 2 hours if needed.  Maximum 2 tablets in 24 hours. Take ondansetron (Zofran) for nausea. Limit use of pain relievers to no more than 2 days out of the week.  These medications include acetaminophen, NSAIDs (ibuprofen/Advil/Motrin, naproxen/Aleve, triptans (Imitrex/sumatriptan), Excedrin, and narcotics.  This will help reduce risk of rebound headaches. Be aware of common food triggers:  - Caffeine:  coffee, black tea, cola, Mt. Dew  - Chocolate  - Dairy:  aged cheeses (brie, blue, cheddar, gouda, [place], provolone, [place], Swiss, etc), chocolate milk, buttermilk, sour cream, limit eggs and yogurt  - Nuts, peanut butter  - Alcohol  - Cereals/grains:  FRESH breads (fresh bagels, sourdough, doughnuts), yeast productions  - Processed/canned/aged/cured meats (pre-packaged deli meats, hotdogs)  - MSG/glutamate:  soy sauce, flavor enhancer, pickled/preserved/marinated foods  - Sweeteners:  aspartame (Equal, Nutrasweet).  Sugar and Splenda are okay  - Vegetables:  legumes (lima beans, lentils, snow peas, fava beans, pinto peans, peas, garbanzo beans), sauerkraut, onions, olives, pickles  - Fruit:  avocados, bananas, citrus fruit (orange, lemon, grapefruit), mango  - Other:  Frozen meals, macaroni and cheese Routine exercise Stay adequately hydrated (aim for 64 oz water daily) Keep headache diary Maintain proper stress management Maintain proper sleep hygiene Do not skip meals Consider supplements:  magnesium citrate 400mg  daily, riboflavin 400mg  daily, coenzyme Q10 100mg  three times daily.

## 2020-12-11 NOTE — Progress Notes (Signed)
Candis Shine (Key: [id number]) Aimovig 140MG /ML auto-injectors   Form [employer] Electronic Prior Authorization Form (2017 NCPDP) Created 3 minutes ago Sent to Plan 3 minutes ago Plan Response 2 minutes ago Submit Clinical Questions less than a minute ago Determination Wait for Determination Please wait for [employer] 2017 NCPDP to return a determination.

## 2020-12-12 NOTE — Progress Notes (Addendum)
Prior authorization denied for aimovig 140mg  did not meet prior authorization requirements.  Contact patient advised to contact her insurance and see what they will cover, not noted in denial. Patient has [employer] community plan.

## 2020-12-16 NOTE — Progress Notes (Signed)
Candis Shine (Key: [id number])  This request has received an Unfavorable outcome.  Please note any additional information provided by the [employer] at the bottom of your screen.  You will also receive a faxed copy of the determination.

## 2020-12-26 NOTE — Progress Notes (Signed)
Patient to come by tomorrow and sign authorization to appeal and ask for Melanie Galloway. Form in preauthorizations folder.

## 2021-01-20 ENCOUNTER — Encounter: Payer: Self-pay | Admitting: Neurology

## 2021-02-20 ENCOUNTER — Encounter: Payer: Self-pay | Admitting: Neurology

## 2021-05-22 ENCOUNTER — Telehealth: Payer: Self-pay

## 2021-05-22 NOTE — Telephone Encounter (Signed)
New message   Your information has been sent to [employer].  Melanie Galloway (Key: [id number]) Rx #: [id number] Aimovig 140MG /ML auto-injectors   Form [employer] Electronic Prior Authorization Form (2017 NCPDP) Created 2 days ago Sent to Plan 9 minutes ago Plan Response 8 minutes ago Submit Clinical Questions less than a minute ago Determination Wait for Determination Please wait for [employer] 2017 NCPDP to return a determination.

## 2021-05-27 NOTE — Telephone Encounter (Signed)
Melanie Galloway (Key: [id number]) Rx #: [phone] Aimovig 140MG /ML auto-injectors   Form [employer] Electronic Prior Authorization Form (2017 NCPDP) Created 7 days ago Sent to Plan 5 days ago Plan Response 5 days ago Submit Clinical Questions 5 days ago Determination Favorable 5 days ago Message from Plan Request Reference Number: [id number]. AIMOVIG INJ 140MG /ML is approved through 05/22/2022. For further questions, call at [phone].

## 2021-05-29 ENCOUNTER — Ambulatory Visit: Payer: Medicaid Other | Admitting: Neurology

## 2021-06-03 ENCOUNTER — Emergency Department (INDEPENDENT_AMBULATORY_CARE_PROVIDER_SITE_OTHER)
Admission: EM | Admit: 2021-06-03 | Discharge: 2021-06-03 | Disposition: A | Discharge status: Home / Self Care | Payer: Medicaid Other | Source: Home / Self Care | Attending: Family Medicine | Admitting: Family Medicine

## 2021-06-03 ENCOUNTER — Other Ambulatory Visit: Payer: Self-pay

## 2021-06-03 DIAGNOSIS — R0789 Other chest pain: Secondary | ICD-10-CM | POA: Diagnosis not present

## 2021-06-03 NOTE — ED Provider Notes (Signed)
[facility]    CSN: [id number] Arrival date & time: 06/03/21  1739      History   Chief Complaint Chief Complaint  Patient presents with   Back Pain   Cough   Nasal Congestion    HPI Melanie Galloway is a 30 y.o. female.   HPI 30 year old female presents with chest pain, cough, congestion, and back pain x2 weeks.  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   SVD (spontaneous vaginal delivery) 12/08/2013   Anxiety 09/05/2013   Depression 09/05/2013   Chronic headache 07/04/2013   Abnormal maternal serum screening test 06/13/2013   UTI (urinary tract infection) in pregnancy in first trimester 06/06/2013   Supervision of normal first pregnancy 05/09/2013   Asthma complicating pregnancy, antepartum 05/09/2013    Past Surgical History:  Procedure Laterality Date   EXTERNAL EAR SURGERY     EYE MUSCLE SURGERY     EYE SURGERY     left     OB History     Gravida  2   Para  1   Term  1   Preterm      AB  1   Living  1      SAB  1   IAB      Ectopic      Multiple      Live Births  1            Home Medications    Prior to Admission medications   Medication Sig Start Date End Date Taking? Authorizing Provider  albuterol (VENTOLIN HFA) 108 (90 Base) MCG/ACT inhaler Inhale 1 puff into the lungs every 6 (six) hours as needed for wheezing or shortness of breath. 04/25/20   [provider]  cyclobenzaprine (FLEXERIL) 5 MG tablet Take 5 mg by mouth 2 (two) times daily as needed for muscle spasms. Patient not taking: Reported on 06/03/2021 03/29/20   [provider]  Erenumab-aooe (AIMOVIG) 140 MG/ML SOAJ Inject 140 mg into the skin every 28 (twenty-eight) days. 12/05/20   [name], [name], DO  FLUoxetine (PROZAC) 20 MG capsule Take 60 mg by mouth daily. 02/20/20 02/19/21  [provider]  Multiple Vitamins-Minerals (ONE-A-DAY WOMENS PO) Take 1 tablet by mouth daily.    [provider]  ondansetron (ZOFRAN) 4 MG tablet Take 1 tablet (4 mg total) by mouth every 8 (eight) hours as needed for nausea or vomiting. 12/05/20   [name], [name], DO  OVER THE COUNTER MEDICATION Take 4 tablets by mouth daily. balance    [provider]  rizatriptan (MAXALT) 10 MG tablet Take 1 tablet earliest onset of migraine.  May repeat in 2 hours if needed.  Maximum 2 tablets in 24 hours. Patient not taking: Reported on 06/03/2021 12/05/20   Drema Dallas, DO    Family History Family History  Problem Relation Age of Onset   Cancer Mother    Crohn's disease Mother    Asthma Sister     Social History Social History   Tobacco Use   Smoking status: Former    Packs/day: 1.00    Types: Cigarettes    Quit date: 06/2017    Years since quitting: 3.9   Smokeless tobacco: Never  Vaping Use   Vaping Use: Never used  Substance Use Topics  Alcohol use: Yes    Comment: Socially   Drug use: Not Currently    Types: Marijuana    Comment: 2014     Allergies   Toradol [ketorolac tromethamine] and Fish allergy   Review of Systems Review of Systems  HENT:  Positive for congestion.   Respiratory:  Positive for cough.   Cardiovascular:  Positive for chest pain.  Musculoskeletal:  Positive for back pain.  All other systems reviewed and are negative.   Physical Exam Triage Vital Signs ED Triage Vitals  Enc Vitals Group     BP 06/03/21 1751 110/70     Pulse Rate 06/03/21 1751 88     Resp 06/03/21 1751 16     Temp 06/03/21 1751 98.9 F (37.2 C)     Temp Source 06/03/21 1751 Oral     SpO2 06/03/21 1751 98 %     Weight --      Height --      Head Circumference --      Peak Flow --      Pain Score 06/03/21 1753 7     Pain Loc --      Pain Edu? --      Excl. in [place]? --    No data found.  Updated Vital Signs BP 110/70 (BP Location: Left Arm)    Pulse 88     Temp 98.9 F (37.2 C) (Oral)    Resp 16    SpO2 98%    Physical Exam Vitals and nursing note reviewed.  Constitutional:      General: She is not in acute distress.    Appearance: Normal appearance. She is normal weight. She is not ill-appearing.  HENT:     Head: Normocephalic and atraumatic.     Right Ear: Tympanic membrane, ear canal and external ear normal.     Left Ear: Tympanic membrane, ear canal and external ear normal.     Mouth/Throat:     Mouth: Mucous membranes are moist.     Pharynx: Oropharynx is clear.  Eyes:     Extraocular Movements: Extraocular movements intact.     Conjunctiva/sclera: Conjunctivae normal.     Pupils: Pupils are equal, round, and reactive to light.  Cardiovascular:     Rate and Rhythm: Normal rate and regular rhythm.     Pulses: Normal pulses.     Heart sounds: Normal heart sounds. No murmur heard.   No friction rub. No gallop.  Pulmonary:     Effort: Pulmonary effort is normal.     Breath sounds: Normal breath sounds. No stridor. No wheezing, rhonchi or rales.  Musculoskeletal:     Cervical back: Normal range of motion and neck supple.  Skin:    General: Skin is warm and dry.  Neurological:     General: No focal deficit present.     Mental Status: She is alert and oriented to person, place, and time.     UC Treatments / Results  Labs (all labs ordered are listed, but only abnormal results are displayed) Labs Reviewed - No data to display  EKG   Radiology No results found.  Procedures Procedures (including critical care time)  Medications Ordered in UC Medications - No data to display  Initial Impression / Assessment and Plan / UC Course  I have reviewed the triage vital signs and the nursing notes.  Pertinent labs & imaging results that were available during my care of the patient were reviewed by me and considered in  my medical decision making (see chart for details).     MDM: 1.  Other chest pain-EKG revealed NSR,  normal EKG. Advised/inform patient that this evening's EKG was normal sinus rhythm with no abnormalities. Patient discharged home, hemodynamically stable. Final Clinical Impressions(s) / UC Diagnoses   Final diagnoses:  Other chest pain     Discharge Instructions      Advised/inform patient that this evening's EKG was normal sinus rhythm with no abnormalities.     ED Prescriptions   None    PDMP not reviewed this encounter.   Eliezer Lofts, [place] 06/03/21 [phone]

## 2021-06-03 NOTE — ED Triage Notes (Signed)
Pt presents with cough, congestion, CP and back pain x 2 weeks

## 2021-06-03 NOTE — Discharge Instructions (Addendum)
Advised/inform patient that this evening's EKG was normal sinus rhythm with no abnormalities.
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
## 2021-06-05 ENCOUNTER — Ambulatory Visit: Payer: Medicaid Other | Admitting: Neurology

## 2021-06-13 ENCOUNTER — Encounter: Payer: Self-pay | Admitting: Obstetrics and Gynecology

## 2021-06-13 ENCOUNTER — Other Ambulatory Visit: Payer: Self-pay

## 2021-06-13 ENCOUNTER — Ambulatory Visit: Payer: Medicaid Other | Admitting: Obstetrics and Gynecology

## 2021-06-13 VITALS — BP 114/68 | HR 78 | Ht 64.0 in | Wt 117.0 lb

## 2021-06-13 DIAGNOSIS — N926 Irregular menstruation, unspecified: Secondary | ICD-10-CM

## 2021-06-13 NOTE — Progress Notes (Signed)
Pt presents today as new patient for evaluation on irregular menstrual cycles. Pt states for the last 5 months she has been having cycles monthly but they only last 1 day at a time. States she has taken several UPT which are negative. LMP: 06/01/21. Pt is not currently on BC. States she is trying for conception.

## 2021-06-13 NOTE — Progress Notes (Signed)
GYNECOLOGY ENCOUNTER NOTE  History:     Melanie Galloway is a 31 y.o. G34P1011 female here concerns about abnormal menstrual cycle. Denies abnormal discharge, pelvic pain, problems with intercourse.  Desires pregnancy in future, is not currently using any form of prevention.   She reports as of August 2022 she has been having 1 day menstrual cycles. Every single month she has had 1 day period. The period is light and red, however does not go longer than 1 day.   No new medications. No new diet or exercise programs.  1 partner, married. Not concerned about STD's.  She has fullness in her abdomen and pelvis and feels uncomfortable most days in her clothing.    Gynecologic History Patient's last menstrual period was 06/01/2021 (exact date). Contraception: none Last Pap: unknown.  Result was normal with negative HPV   Obstetric History OB History  Gravida Para Term Preterm AB Living  2 1 1   1 1   SAB IAB Ectopic Multiple Live Births  1       1    # Outcome Date GA Lbr Len/2nd Weight Sex Delivery Anes PTL Lv  2 SAB 02/17/18 [redacted]w[redacted]d         1 Term 12/08/13 [redacted]w[redacted]d 20:52 / 01:48 8 lb 2.2 oz (3.691 kg) M Vag-Spont EPI  LIV     Birth Comments: moulding    Past Medical History:  Diagnosis Date   Asthma    Migraines    Stomach ulcer     Past Surgical History:  Procedure Laterality Date   EXTERNAL EAR SURGERY     EYE MUSCLE SURGERY     EYE SURGERY     left     Current Outpatient Medications on File Prior to Visit  Medication Sig Dispense Refill   albuterol (VENTOLIN HFA) 108 (90 Base) MCG/ACT inhaler Inhale 1 puff into the lungs every 6 (six) hours as needed for wheezing or shortness of breath.     amphetamine-dextroamphetamine (ADDERALL) 10 MG tablet Take 10 mg by mouth daily.     diazepam (VALIUM) 5 MG tablet Take 5 mg by mouth every 8 (eight) hours as needed.     Erenumab-aooe (AIMOVIG) 140 MG/ML SOAJ Inject 140 mg into the skin every 28 (twenty-eight) days. 1.12 mL 5    FLUoxetine (PROZAC) 20 MG capsule Take 20 mg by mouth daily. Take 3 caps daily     ondansetron (ZOFRAN) 4 MG tablet Take 1 tablet (4 mg total) by mouth every 8 (eight) hours as needed for nausea or vomiting. 20 tablet 5   OVER THE COUNTER MEDICATION Take 4 tablets by mouth daily. balance     No current facility-administered medications on file prior to visit.    Allergies  Allergen Reactions   Toradol [Ketorolac Tromethamine] Anaphylaxis and Hives   Fish Allergy Hives    Grouper caused hives    Social History:  reports that she quit smoking about 4 years ago. Her smoking use included cigarettes. She smoked an average of 1 pack per day. She has never used smokeless tobacco. She reports current alcohol use. She reports that she does not currently use drugs after having used the following drugs: Marijuana.  Family History  Problem Relation Age of Onset   Cancer Mother    Crohn's disease Mother    Asthma Sister     The following portions of the patient's history were reviewed and updated as appropriate: allergies, current medications, past family history, past medical history,  past social history, past surgical history and problem list.  Review of Systems Pertinent items noted in HPI and remainder of comprehensive ROS otherwise negative.  Physical Exam:  BP 114/68    Pulse 78    Ht 5\' 4"  (1.626 m)    Wt 117 lb (53.1 kg)    LMP 06/01/2021 (Exact Date)    Breastfeeding No    BMI 20.08 kg/m  CONSTITUTIONAL: Well-developed, well-nourished female in no acute distress.  HENT:  Normocephalic, atraumatic, External right and left ear normal.  EYES: Conjunctivae and EOM are normal. Pupils are equal, round, and reactive to light. No scleral icterus.  NECK: Normal range of motion, supple, no masses.  Normal thyroid.  SKIN: Skin is warm and dry. No rash noted. Not diaphoretic. No erythema. No pallor. MUSCULOSKELETAL: Normal range of motion. No tenderness.  No cyanosis, clubbing, or  edema. NEUROLOGIC: Alert and oriented to person, place, and time. Normal reflexes, muscle tone coordination.  PSYCHIATRIC: Normal mood and affect. Normal behavior. Normal judgment and thought content. CARDIOVASCULAR: Normal heart rate noted, regular rhythm RESPIRATORY: Clear to auscultation bilaterally. Effort and breath sounds normal, no problems with respiration noted. ABDOMEN: Soft, No tenderness, rebound or guarding. Bloating, distention noted in lower abdomen.  PELVIC: Normal appearing external genitalia and urethral meatus.   Normal uterine size, fullness felt in the lower pelvis. No other palpable masses, no uterine or adnexal tenderness.  Performed in the presence of a chaperone.   Assessment and Plan:   1. Abnormal menstrual cycle  - HgB A1c - TSH - CBC w/Diff - B-HCG Quant - Prolactin - US PELVIC COMPLETE WITH TRANSVAGINAL; Future  - Urine pregnancy test negative.  - Would consider bleed trial with progesterone, however patient would like labs and [place] to result prior to starting medication.    Arika Mainer, Artist [place], [place] for [employer], [place]

## 2021-06-14 LAB — CBC WITH DIFFERENTIAL/PLATELET
Absolute Monocytes: 423 cells/uL (ref 200–950)
Basophils Absolute: 52 cells/uL (ref 0–200)
Basophils Relative: 0.8 %
Eosinophils Absolute: 143 cells/uL (ref 15–500)
Eosinophils Relative: 2.2 %
HCT: 40.8 % (ref 35.0–45.0)
Hemoglobin: 13.9 g/dL (ref 11.7–15.5)
Lymphs Abs: 1963 cells/uL (ref 850–3900)
MCH: 31 pg (ref 27.0–33.0)
MCHC: 34.1 g/dL (ref 32.0–36.0)
MCV: 90.9 fL (ref 80.0–100.0)
MPV: 10.8 fL (ref 7.5–12.5)
Monocytes Relative: 6.5 %
Neutro Abs: 3920 cells/uL (ref 1500–7800)
Neutrophils Relative %: 60.3 %
Platelets: 256 10*3/uL (ref 140–400)
RBC: 4.49 10*6/uL (ref 3.80–5.10)
RDW: 12.8 % (ref 11.0–15.0)
Total Lymphocyte: 30.2 %
WBC: 6.5 10*3/uL (ref 3.8–10.8)

## 2021-06-14 LAB — HEMOGLOBIN A1C
Hgb A1c MFr Bld: 5 % of total Hgb (ref <5.7)
Mean Plasma Glucose: 97 mg/dL
eAG (mmol/L): 5.4 mmol/L

## 2021-06-14 LAB — PROLACTIN: Prolactin: 15.8 ng/mL

## 2021-06-14 LAB — TSH: TSH: 1.13 mIU/L

## 2021-06-14 LAB — HCG, QUANTITATIVE, PREGNANCY: HCG, Total, QN: <3 m[IU]/mL

## 2021-06-16 ENCOUNTER — Other Ambulatory Visit: Payer: Medicaid Other

## 2021-06-27 ENCOUNTER — Ambulatory Visit: Payer: Medicaid Other

## 2021-06-27 ENCOUNTER — Encounter: Payer: Self-pay | Admitting: Obstetrics and Gynecology

## 2021-06-27 ENCOUNTER — Ambulatory Visit: Payer: Medicaid Other | Admitting: Obstetrics and Gynecology

## 2021-06-27 ENCOUNTER — Other Ambulatory Visit: Payer: Self-pay

## 2021-06-27 ENCOUNTER — Other Ambulatory Visit (HOSPITAL_COMMUNITY)
Admission: RE | Admit: 2021-06-27 | Discharge: 2021-06-27 | Disposition: A | Discharge status: Home / Self Care | Payer: Medicaid Other | Source: Ambulatory Visit | Attending: Obstetrics and Gynecology | Admitting: Obstetrics and Gynecology

## 2021-06-27 VITALS — BP 108/65 | HR 79 | Ht 64.0 in | Wt 115.0 lb

## 2021-06-27 DIAGNOSIS — Z01419 Encounter for gynecological examination (general) (routine) without abnormal findings: Secondary | ICD-10-CM | POA: Insufficient documentation

## 2021-06-27 DIAGNOSIS — N926 Irregular menstruation, unspecified: Secondary | ICD-10-CM | POA: Diagnosis not present

## 2021-06-27 NOTE — Progress Notes (Signed)
GYNECOLOGY ANNUAL PREVENTATIVE CARE ENCOUNTER NOTE  History:     Melanie Galloway is a 31 y.o. G26P1011 female here for a routine annual gynecologic exam.  Current complaints: none.    Denies abnormal vaginal bleeding, discharge, pelvic pain, problems with intercourse or other gynecologic concerns.   Gynecologic History Patient's last menstrual period was 06/01/2021 (exact date). Contraception: none Last Pap: unknown.   Obstetric History OB History  Gravida Para Term Preterm AB Living  2 1 1   1 1   SAB IAB Ectopic Multiple Live Births  1       1    # Outcome Date GA Lbr Len/2nd Weight Sex Delivery Anes PTL Lv  2 SAB 02/17/18 [redacted]w[redacted]d         1 Term 12/08/13 [redacted]w[redacted]d 20:52 / 01:48 8 lb 2.2 oz (3.691 kg) M Vag-Spont EPI  LIV     Birth Comments: moulding    Past Medical History:  Diagnosis Date   Asthma    Migraines    Stomach ulcer     Past Surgical History:  Procedure Laterality Date   EXTERNAL EAR SURGERY     EYE MUSCLE SURGERY     EYE SURGERY     left     Current Outpatient Medications on File Prior to Visit  Medication Sig Dispense Refill   albuterol (VENTOLIN HFA) 108 (90 Base) MCG/ACT inhaler Inhale 1 puff into the lungs every 6 (six) hours as needed for wheezing or shortness of breath.     amphetamine-dextroamphetamine (ADDERALL) 10 MG tablet Take 10 mg by mouth daily.     diazepam (VALIUM) 5 MG tablet Take 5 mg by mouth every 8 (eight) hours as needed.     Erenumab-aooe (AIMOVIG) 140 MG/ML SOAJ Inject 140 mg into the skin every 28 (twenty-eight) days. 1.12 mL 5   FLUoxetine (PROZAC) 20 MG capsule Take 20 mg by mouth daily. Take 3 caps daily     ondansetron (ZOFRAN) 4 MG tablet Take 1 tablet (4 mg total) by mouth every 8 (eight) hours as needed for nausea or vomiting. 20 tablet 5   OVER THE COUNTER MEDICATION Take 4 tablets by mouth daily. balance     No current facility-administered medications on file prior to visit.    Allergies  Allergen Reactions    Toradol [Ketorolac Tromethamine] Anaphylaxis and Hives   Fish Allergy Hives    Grouper caused hives    Social History:  reports that she quit smoking about 4 years ago. Her smoking use included cigarettes. She smoked an average of 1 pack per day. She has never used smokeless tobacco. She reports current alcohol use. She reports that she does not currently use drugs after having used the following drugs: Marijuana.  Family History  Problem Relation Age of Onset   Cancer Mother    Crohn's disease Mother    Asthma Sister     The following portions of the patient's history were reviewed and updated as appropriate: allergies, current medications, past family history, past medical history, past social history, past surgical history and problem list.  Review of Systems Pertinent items noted in HPI and remainder of comprehensive ROS otherwise negative.  Physical Exam:  BP 108/65    Pulse 79    Ht 5\' 4"  (1.626 m)    Wt 115 lb (52.2 kg)    LMP 06/01/2021 (Exact Date)    BMI 19.74 kg/m  CONSTITUTIONAL: Well-developed, well-nourished female in no acute distress.  HENT:  Normocephalic, atraumatic,  External right and left ear normal.  EYES: Conjunctivae and EOM are normal. Pupils are equal, round, and reactive to light. No scleral icterus.  NECK: Normal range of motion, supple, no masses.  Normal thyroid.  SKIN: Skin is warm and dry. No rash noted. Not diaphoretic. No erythema. No pallor. MUSCULOSKELETAL: Normal range of motion. No tenderness.  No cyanosis, clubbing, or edema. NEUROLOGIC: Alert and oriented to person, place, and time. Normal reflexes, muscle tone coordination.  PSYCHIATRIC: Normal mood and affect. Normal behavior. Normal judgment and thought content. CARDIOVASCULAR: Normal heart rate noted, regular rhythm RESPIRATORY: Clear to auscultation bilaterally. Effort and breath sounds normal, no problems with respiration noted. BREASTS: Symmetric in size. No masses, tenderness, skin  changes, nipple drainage, or lymphadenopathy bilaterally. Performed in the presence of a chaperone. ABDOMEN: Soft, no distention noted.  No tenderness, rebound or guarding.  PELVIC: Normal appearing external genitalia and urethral meatus; normal appearing vaginal mucosa and cervix.  No abnormal vaginal discharge noted.  Pap smear obtained.  Normal uterine size, no other palpable masses, no uterine or adnexal tenderness.  Performed in the presence of a chaperone.   Assessment and Plan:   1. Well woman exam  - Cytology - PAP( [place]) - Partner present today.   2. Abnormal menstrual cycle  Reviewed previous lab work- all WNL. Pelvic US today. Will review and decide if she would like to try a progesterone challenge.   Will follow up results of pap smear and manage accordingly. Routine preventative health maintenance measures emphasized. Please refer to After Visit Summary for other counseling recommendations.    Zorion Nims, Artist Pais, [place] for [employer], [place]

## 2021-06-30 ENCOUNTER — Other Ambulatory Visit: Payer: Medicaid Other

## 2021-06-30 LAB — CYTOLOGY - PAP: Diagnosis: NEGATIVE

## 2021-07-15 ENCOUNTER — Other Ambulatory Visit: Payer: Medicaid Other

## 2021-07-22 ENCOUNTER — Other Ambulatory Visit: Payer: Medicaid Other

## 2021-07-24 ENCOUNTER — Ambulatory Visit (INDEPENDENT_AMBULATORY_CARE_PROVIDER_SITE_OTHER): Payer: Medicaid Other

## 2021-07-24 ENCOUNTER — Other Ambulatory Visit: Payer: Self-pay

## 2021-07-24 DIAGNOSIS — N926 Irregular menstruation, unspecified: Secondary | ICD-10-CM | POA: Diagnosis not present

## 2021-07-29 ENCOUNTER — Other Ambulatory Visit: Payer: Self-pay

## 2021-07-29 ENCOUNTER — Encounter: Payer: Self-pay | Admitting: Emergency Medicine

## 2021-07-29 ENCOUNTER — Emergency Department (INDEPENDENT_AMBULATORY_CARE_PROVIDER_SITE_OTHER)
Admission: EM | Admit: 2021-07-29 | Discharge: 2021-07-29 | Disposition: A | Discharge status: Home / Self Care | Payer: Medicaid Other | Source: Home / Self Care

## 2021-07-29 DIAGNOSIS — R112 Nausea with vomiting, unspecified: Secondary | ICD-10-CM

## 2021-07-29 DIAGNOSIS — R109 Unspecified abdominal pain: Secondary | ICD-10-CM

## 2021-07-29 MED ORDER — ONDANSETRON 4 MG PO TBDP
4.0000 mg | ORAL_TABLET | Freq: Once | ORAL | Status: AC
  Administered 2021-07-29: 4 mg via ORAL

## 2021-07-29 NOTE — ED Notes (Signed)
Intractable N/V at the end of triage - see zofran given- provider to room

## 2021-07-29 NOTE — ED Notes (Signed)
Patient is being discharged from the Urgent Care and sent to the Emergency Department via POV . Per Dorann Ou, PA. patient is in need of higher level of care due to intractable N/V & severe abdominal pain which radiates to her back. Patient is aware and verbalizes understanding of plan of care.  Vitals:   07/29/21 0918  BP: 104/69  Pulse: 86  Resp: 16  Temp: 98.3 F (36.8 C)  SpO2: 98%  Report called to [facility] ED charge RN

## 2021-07-29 NOTE — ED Triage Notes (Signed)
Pt was diagnosed w/ an ovarian cyst last week  Pt woke up this morning at 0200 w/ severe abdominal pain then back pain  Vomiting at home w/ diarrhea Zofran 4 mg at 0400 & 0700 Here w/ her husband

## 2021-07-29 NOTE — ED Provider Notes (Signed)
[facility]    CSN: [id number] Arrival date & time: 07/29/21  0857      History   Chief Complaint Chief Complaint  Patient presents with   Abdominal Pain    HPI Melanie Galloway is a 31 y.o. female.   I was called to evaluate patient by nursing staff given severity of symptoms.  She reports a several hour history of severe upper abdominal pain with radiation into back which is rated 10 on a 0-10 pain scale, described as sharp, no aggravating or alleviating factors identified.  She reports nausea, vomiting, diarrhea without melena, hematochezia, hematemesis.  She was given Zofran prior to arrival without improvement of symptoms as well as an additional dose here with persistent nausea/vomiting.  She denies history of gastrointestinal disorder or previous abdominal surgery.  She did have pelvic ultrasound 07/24/2021 that showed no acute abnormalities.  Denies any known sick contacts.  Denies any dietary changes.  She does not drink alcohol regular basis.  Does not take a GLP-1 agonist.  Denies any medication changes.   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   SVD (spontaneous vaginal delivery) 12/08/2013   Anxiety 09/05/2013   Depression 09/05/2013   Chronic headache 07/04/2013   Abnormal maternal serum screening test 06/13/2013   UTI (urinary tract infection) in pregnancy in first trimester 06/06/2013   Supervision of normal first pregnancy [id number]   Asthma complicating pregnancy, antepartum 05/09/2013    Past Surgical History:  Procedure Laterality Date   EXTERNAL EAR SURGERY     EYE MUSCLE SURGERY     EYE SURGERY     left     OB History     Gravida  2   Para  1   Term  1   Preterm      AB  1   Living  1      SAB  1   IAB       Ectopic      Multiple      Live Births  1            Home Medications    Prior to Admission medications   Medication Sig Start Date End Date Taking? Authorizing Provider  albuterol (VENTOLIN HFA) 108 (90 Base) MCG/ACT inhaler Inhale 1 puff into the lungs every 6 (six) hours as needed for wheezing or shortness of breath. 04/25/20   [provider]  amphetamine-dextroamphetamine (ADDERALL) 10 MG tablet Take 10 mg by mouth daily. Patient not taking: Reported on 07/29/2021 06/11/21   [provider]  amphetamine-dextroamphetamine (ADDERALL) 15 MG tablet dextroamphetamine-amphetamine 15 mg tablet  TAKE 1 TABLET BY MOUTH EVERY DAY    [provider]  diazepam (VALIUM) 5 MG tablet Take 5 mg by mouth every 8 (eight) hours as needed. 03/25/21   [provider]  diazepam (VALIUM) 5 MG tablet diazepam 5 mg tablet  Take 1 tablet every day by oral route at bedtime. Patient not taking: Reported on 07/29/2021    [provider]  Erenumab-aooe (AIMOVIG) 140 MG/ML SOAJ Inject 140 mg into the skin every 28 (twenty-eight) days. Patient not taking: Reported on 07/29/2021 12/05/20   Pieter Partridge, DO  FLUoxetine (PROZAC) 20 MG capsule Take 20 mg  by mouth daily. Take 3 caps daily Patient not taking: Reported on 07/29/2021    [provider]  FLUoxetine (PROZAC) 40 MG capsule fluoxetine 40 mg capsule  TAKE 2 CAPSULES BY MOUTH EVERY DAY    [provider]  ondansetron (ZOFRAN) 4 MG tablet Take 1 tablet (4 mg total) by mouth every 8 (eight) hours as needed for nausea or vomiting. 12/05/20   Tomi Likens, [name], DO  OVER THE COUNTER MEDICATION Take 4 tablets by mouth daily. balance    [provider]    Family History Family History  Problem Relation Age of Onset   Cancer Mother    Crohn's disease Mother    Kidney Stones Mother    Asthma Sister     Social History Social History   Tobacco Use   Smoking status: Former    Packs/day: 1.00     Types: Cigarettes    Quit date: 06/2017    Years since quitting: 4.1   Smokeless tobacco: Never  Vaping Use   Vaping Use: Never used  Substance Use Topics   Alcohol use: Yes    Comment: Socially   Drug use: Not Currently    Types: Marijuana    Comment: 2014     Allergies   Toradol [ketorolac tromethamine] and Fish allergy   Review of Systems Review of Systems  Constitutional:  Positive for activity change, appetite change and chills. Negative for fatigue and fever.  Respiratory:  Negative for cough and shortness of breath.   Cardiovascular:  Negative for chest pain.  Gastrointestinal:  Positive for abdominal pain, diarrhea, nausea and vomiting.  Neurological:  Negative for dizziness, light-headedness and headaches.    Physical Exam Triage Vital Signs ED Triage Vitals  Enc Vitals Group     BP 07/29/21 0918 104/69     Pulse Rate 07/29/21 0918 86     Resp 07/29/21 0918 16     Temp 07/29/21 0918 98.3 F (36.8 C)     Temp Source 07/29/21 0918 Oral     SpO2 07/29/21 0918 98 %     Weight 07/29/21 0923 116 lb (52.6 kg)     Height 07/29/21 0923 5\' 4"  (1.626 m)     Head Circumference --      Peak Flow --      Pain Score 07/29/21 0922 6     Pain Loc --      Pain Edu? --      Excl. in [place]? --    No data found.  Updated Vital Signs BP 104/69 (BP Location: Left Arm)    Pulse 86    Temp 98.3 F (36.8 C) (Oral)    Resp 16    Ht 5\' 4"  (1.626 m)    Wt 116 lb (52.6 kg)    SpO2 98%    BMI 19.91 kg/m   Visual Acuity Right Eye Distance:   Left Eye Distance:   Bilateral Distance:    Right Eye Near:   Left Eye Near:    Bilateral Near:     Physical Exam Vitals reviewed.  Constitutional:      General: She is awake. She is not in acute distress.    Appearance: Normal appearance. She is well-developed. She is ill-appearing.     Comments: Very pleasant female obviously uncomfortable sitting on exam table with emesis basin  HENT:     Head: Normocephalic and atraumatic.   Cardiovascular:     Rate and Rhythm: Normal rate and regular rhythm.  Heart sounds: Normal heart sounds, S1 normal and S2 normal. No murmur heard. Pulmonary:     Effort: Pulmonary effort is normal.     Breath sounds: Normal breath sounds. No wheezing, rhonchi or rales.     Comments: Clear auscultation bilaterally Abdominal:     General: Bowel sounds are normal.     Palpations: Abdomen is soft.     Tenderness: There is abdominal tenderness in the right upper quadrant, epigastric area and left upper quadrant. There is guarding and rebound. There is no right CVA tenderness or left CVA tenderness.     Comments: Significant tenderness palpation throughout upper abdomen with rebound and guarding.  Psychiatric:        Behavior: Behavior is cooperative.     UC Treatments / Results  Labs (all labs ordered are listed, but only abnormal results are displayed) Labs Reviewed - No data to display  EKG   Radiology No results found.  Procedures Procedures (including critical care time)  Medications Ordered in UC Medications  ondansetron (ZOFRAN-ODT) disintegrating tablet 4 mg (4 mg Oral Given 07/29/21 0925)    Initial Impression / Assessment and Plan / UC Course  I have reviewed the triage vital signs and the nursing notes.  Pertinent labs & imaging results that were available during my care of the patient were reviewed by me and considered in my medical decision making (see chart for details).     Concern for acute abdomen.  Discussed that we do not have the imaging and lab work capabilities to evaluate this in a timely manner and recommended she go to the emergency room.  Patient was agreeable to this and will go directly to the emergency room following visit today.  Her husband accompanied her to visit and will transport her by private vehicle.  Vital signs were stable at the time of discharge and she was safe for private transport.  Final Clinical Impressions(s) / UC Diagnoses    Final diagnoses:  Sudden onset of severe abdominal pain  Intractable nausea and vomiting   Discharge Instructions   None    ED Prescriptions   None    PDMP not reviewed this encounter.   Terrilee Croak, PA-C 07/29/21 [id number]

## 2022-04-03 ENCOUNTER — Ambulatory Visit
Admission: EM | Admit: 2022-04-03 | Discharge: 2022-04-03 | Discharge status: Absent without leave | Payer: Medicaid Other

## 2022-04-03 ENCOUNTER — Other Ambulatory Visit: Payer: Self-pay

## 2022-04-03 ENCOUNTER — Encounter (HOSPITAL_BASED_OUTPATIENT_CLINIC_OR_DEPARTMENT_OTHER): Payer: Self-pay | Admitting: *Deleted

## 2022-04-03 ENCOUNTER — Emergency Department (HOSPITAL_BASED_OUTPATIENT_CLINIC_OR_DEPARTMENT_OTHER): Payer: Medicaid Other

## 2022-04-03 ENCOUNTER — Emergency Department (HOSPITAL_BASED_OUTPATIENT_CLINIC_OR_DEPARTMENT_OTHER)
Admission: EM | Admit: 2022-04-03 | Discharge: 2022-04-03 | Disposition: A | Discharge status: Home / Self Care | Payer: Medicaid Other | Attending: Emergency Medicine | Admitting: Emergency Medicine

## 2022-04-03 DIAGNOSIS — S060X1A Concussion with loss of consciousness of 30 minutes or less, initial encounter: Secondary | ICD-10-CM | POA: Insufficient documentation

## 2022-04-03 DIAGNOSIS — F17200 Nicotine dependence, unspecified, uncomplicated: Secondary | ICD-10-CM | POA: Insufficient documentation

## 2022-04-03 DIAGNOSIS — W2203XA Walked into furniture, initial encounter: Secondary | ICD-10-CM | POA: Insufficient documentation

## 2022-04-03 DIAGNOSIS — S0990XA Unspecified injury of head, initial encounter: Secondary | ICD-10-CM | POA: Diagnosis present

## 2022-04-03 HISTORY — DX: Unspecified intracranial injury with loss of consciousness status unknown, initial encounter: S06.9XAA
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
## 2022-04-03 NOTE — ED Notes (Signed)
Pt complaining of a headache after hitting her head a couple of days ago. Is A&O x4, steady gait, no other complaints. Agrees to going home.

## 2022-04-03 NOTE — Discharge Instructions (Addendum)
You were seen today due to a head injury.  Your symptoms are consistent with concussion.  Your CT scan was reassuring for no acute findings.  I have attached contact information for sports medicine physician who also helps with postconcussion syndrome.  Please contact them for follow-up as needed.  If you develop any life-threatening conditions please return to the emergency department.
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
## 2022-04-03 NOTE — ED Notes (Signed)
Patient states she hit her head on granite counter last Saturday.

## 2022-05-12 ENCOUNTER — Other Ambulatory Visit (HOSPITAL_COMMUNITY): Payer: Self-pay

## 2022-05-13 ENCOUNTER — Other Ambulatory Visit: Payer: Self-pay | Admitting: Neurology

## 2022-06-29 ENCOUNTER — Ambulatory Visit: Payer: Medicaid Other | Admitting: Obstetrics and Gynecology

## 2022-07-04 ENCOUNTER — Other Ambulatory Visit: Payer: Self-pay | Admitting: Neurology

## 2023-10-31 ENCOUNTER — Emergency Department (HOSPITAL_BASED_OUTPATIENT_CLINIC_OR_DEPARTMENT_OTHER)
Admission: EM | Admit: 2023-10-31 | Discharge: 2023-10-31 | Disposition: A | Discharge status: Home / Self Care | Attending: Emergency Medicine | Admitting: Emergency Medicine

## 2023-10-31 ENCOUNTER — Other Ambulatory Visit: Payer: Self-pay

## 2023-10-31 ENCOUNTER — Encounter (HOSPITAL_BASED_OUTPATIENT_CLINIC_OR_DEPARTMENT_OTHER): Payer: Self-pay

## 2023-10-31 DIAGNOSIS — R109 Unspecified abdominal pain: Secondary | ICD-10-CM | POA: Insufficient documentation

## 2023-10-31 DIAGNOSIS — Z3A01 Less than 8 weeks gestation of pregnancy: Secondary | ICD-10-CM | POA: Diagnosis not present

## 2023-10-31 DIAGNOSIS — O26891 Other specified pregnancy related conditions, first trimester: Secondary | ICD-10-CM | POA: Insufficient documentation

## 2023-10-31 LAB — URINALYSIS, ROUTINE W REFLEX MICROSCOPIC
Bilirubin Urine: NEGATIVE
Glucose, UA: NEGATIVE mg/dL
Hgb urine dipstick: NEGATIVE
Ketones, ur: NEGATIVE mg/dL
Leukocytes,Ua: NEGATIVE
Nitrite: NEGATIVE
Protein, ur: NEGATIVE mg/dL
Specific Gravity, Urine: 1.01 (ref 1.005–1.030)
pH: 6 (ref 5.0–8.0)

## 2023-10-31 LAB — HCG, QUANTITATIVE, PREGNANCY: hCG, Beta Chain, Quant, S: 2514 m[IU]/mL — ABNORMAL HIGH (ref <5)

## 2023-10-31 NOTE — Discharge Instructions (Signed)
 Follow-up with your OB/GYN.  Return for any vaginal bleeding or worse pain.  Pregnancy test positive here today with a quantitative hCG of 2514.  Would recommend having the quantitative hCG repeated in greater than 48 hours.  Also would recommend starting to take a over-the-counter prenatal vitamin.

## 2023-10-31 NOTE — ED Triage Notes (Signed)
 Reports 2 positive, 2 negative pregnancy tests at home 3 weeks ago.   Went to UC yesterday, positive u-preg. Tested again at home-negative.   Abd cramping yesterday and this morning. Denies vaginal bleeding

## 2023-10-31 NOTE — ED Provider Notes (Addendum)
 [place] [facility] Provider Note   CSN: [id number] Arrival date & time: 10/31/23  1050     History  Chief Complaint  Patient presents with   Possible Pregnancy    Melanie Galloway is a 33 y.o. female.  Patient seen urgent care yesterday had positive pregnancy test.  Patient somewhat confused because she has had some negative pregnancy test at home.  Patient is gravida 5 para 1 AB 3.  Patient also with some lower abdominal crampy abdominal pain.  No vaginal bleeding.  Patient is worried about miscarriage again.       Home Medications Prior to Admission medications   Medication Sig Start Date End Date Taking? Authorizing Provider  albuterol  (VENTOLIN  HFA) 108 (90 Base) MCG/ACT inhaler Inhale 1 puff into the lungs every 6 (six) hours as needed for wheezing or shortness of breath. 04/25/20   [provider]  amphetamine-dextroamphetamine (ADDERALL) 10 MG tablet Take 10 mg by mouth daily. Patient not taking: Reported on 07/29/2021 06/11/21   [provider]  amphetamine-dextroamphetamine (ADDERALL) 15 MG tablet dextroamphetamine-amphetamine 15 mg tablet  TAKE 1 TABLET BY MOUTH EVERY DAY    [provider]  diazepam (VALIUM) 5 MG tablet Take 5 mg by mouth every 8 (eight) hours as needed. 03/25/21   [provider]  diazepam (VALIUM) 5 MG tablet diazepam 5 mg tablet  Take 1 tablet every day by oral route at bedtime. Patient not taking: Reported on 07/29/2021    [provider]  Erenumab -aooe (AIMOVIG ) 140 MG/ML SOAJ Inject 140 mg into the skin every 28 (twenty-eight) days. Patient not taking: Reported on 07/29/2021 12/05/20   Merriam Abbey, DO  FLUoxetine (PROZAC) 20 MG capsule Take 20 mg by mouth daily. Take 3 caps daily Patient not taking: Reported on 07/29/2021    [provider]  FLUoxetine (PROZAC) 40 MG capsule fluoxetine 40 mg capsule  TAKE 2 CAPSULES BY MOUTH EVERY DAY    [provider]  ondansetron  (ZOFRAN ) 4 MG tablet Take 1 tablet (4 mg total) by mouth every 8 (eight) hours as needed for nausea or vomiting. 12/05/20   Festus Hubert, [name], DO  OVER THE COUNTER MEDICATION Take 4 tablets by mouth daily. balance    [provider]      Allergies    Toradol  [ketorolac  tromethamine ] and Fish allergy    Review of Systems   Review of Systems  Constitutional:  Negative for chills and fever.  HENT:  Negative for ear pain and sore throat.   Eyes:  Negative for pain and visual disturbance.  Respiratory:  Negative for cough and shortness of breath.   Cardiovascular:  Negative for chest pain and palpitations.  Gastrointestinal:  Positive for abdominal pain. Negative for vomiting.  Genitourinary:  Negative for dysuria, hematuria, vaginal bleeding and vaginal discharge.  Musculoskeletal:  Negative for arthralgias and back pain.  Skin:  Negative for color change and rash.  Neurological:  Negative for seizures and syncope.  All other systems reviewed and are negative.   Physical Exam Updated Vital Signs BP 115/74   Pulse (!) 108   Temp 97.8 F (36.6 C) (Oral)   Resp 16   Ht 1.626 m (5\' 4" )   Wt 55.8 kg   LMP 09/05/2023 (Exact Date)   SpO2 100%   BMI 21.11 kg/m  Physical Exam Vitals and nursing note reviewed.  Constitutional:      General: She is not in acute distress.  Appearance: Normal appearance. She is well-developed.  HENT:     Head: Normocephalic and atraumatic.  Eyes:     Extraocular Movements: Extraocular movements intact.     Conjunctiva/sclera: Conjunctivae normal.     Pupils: Pupils are equal, round, and reactive to light.  Cardiovascular:     Rate and Rhythm: Normal rate and regular rhythm.     Heart sounds: No murmur heard. Pulmonary:     Effort: Pulmonary effort is normal. No respiratory distress.     Breath sounds: Normal breath sounds.  Abdominal:     Palpations: Abdomen is soft.     Tenderness: There is no abdominal  tenderness.  Musculoskeletal:        General: No swelling.     Cervical back: Neck supple.  Skin:    General: Skin is warm and dry.     Capillary Refill: Capillary refill takes less than 2 seconds.  Neurological:     General: No focal deficit present.     Mental Status: She is alert and oriented to person, place, and time.  Psychiatric:        Mood and Affect: Mood normal.     ED Results / Procedures / Treatments   Labs (all labs ordered are listed, but only abnormal results are displayed) Labs Reviewed  HCG, QUANTITATIVE, PREGNANCY - Abnormal; Notable for the following components:      Result Value   hCG, Beta Chain, Quant, S 2,514 (*)    All other components within normal limits  URINALYSIS, ROUTINE W REFLEX MICROSCOPIC    EKG None  Radiology No results found.  Procedures Procedures    Medications Ordered in ED Medications - No data to display  ED Course/ Medical Decision Making/ A&P                                 Medical Decision Making Amount and/or Complexity of Data Reviewed Labs: ordered. Radiology: ordered.  Urinalysis here is normal.  Quantitative pregnancy test is pending.  Positive pregnancy test documented in her chart from urgent care yesterday.  Will get ultrasound transvaginal to rule out ectopic.  Urinalysis negative no evidence of any urinary tract infection as mentioned above.  Patient's quantitative hCG is 2514.  Patient no longer wants the ultrasound.  Will have her follow-up with OB/GYN.  Did have some abdominal cramping but patient not in any significant distress.  Had ordered the ultrasound based on her complaint of abdominal cramping and her concern.  Final Clinical Impression(s) / ED Diagnoses Final diagnoses:  Less than [redacted] weeks gestation of pregnancy    Rx / DC Orders ED Discharge Orders     None         Nicklas Barns, MD 10/31/23 1212    Nicklas Barns, MD 10/31/23 1236    Nicklas Barns, MD 10/31/23  1453

## 2023-11-03 ENCOUNTER — Telehealth: Payer: Self-pay | Admitting: Emergency Medicine

## 2023-11-03 DIAGNOSIS — O3680X0 Pregnancy with inconclusive fetal viability, not applicable or unspecified: Secondary | ICD-10-CM

## 2023-11-03 NOTE — Telephone Encounter (Signed)
 [place]

## 2023-11-15 ENCOUNTER — Ambulatory Visit

## 2023-11-15 ENCOUNTER — Ambulatory Visit: Admitting: Obstetrics and Gynecology

## 2023-11-15 ENCOUNTER — Encounter: Payer: Self-pay | Admitting: Obstetrics and Gynecology

## 2023-11-15 ENCOUNTER — Other Ambulatory Visit: Payer: Self-pay

## 2023-11-15 VITALS — BP 109/65 | HR 77

## 2023-11-15 DIAGNOSIS — Z3A01 Less than 8 weeks gestation of pregnancy: Secondary | ICD-10-CM | POA: Diagnosis not present

## 2023-11-15 DIAGNOSIS — Z3491 Encounter for supervision of normal pregnancy, unspecified, first trimester: Secondary | ICD-10-CM

## 2023-11-15 DIAGNOSIS — O368310 Maternal care for abnormalities of the fetal heart rate or rhythm, first trimester, not applicable or unspecified: Secondary | ICD-10-CM | POA: Diagnosis not present

## 2023-11-15 DIAGNOSIS — O368390 Maternal care for abnormalities of the fetal heart rate or rhythm, unspecified trimester, not applicable or unspecified: Secondary | ICD-10-CM

## 2023-11-15 DIAGNOSIS — O283 Abnormal ultrasonic finding on antenatal screening of mother: Secondary | ICD-10-CM | POA: Insufficient documentation

## 2023-11-15 NOTE — Progress Notes (Signed)
 Obstetrics and Gynecology Visit Return Patient Evaluation  Appointment Date: 11/15/2023  Primary Care Provider: Jolinda [facility]: [facility]  Chief Complaint: add on visit for fetal bradycardia at 7wks  History of Present Illness:  Melanie Galloway is a 33 y.o. [phone] with above chief complaint. LMP 3/20 but patient states it is irregular and sometimes she doesn't have one. First u/s today for viability shows [redacted]w[redacted]d IUP with FHR at 49s, ovaries wnl bilaterally. Patient denies any spotting, bleeding or pain. +morning sickness s/s  Review of Systems: as noted in the History of Present Illness.  Medications:  Job Mulch had no medications administered during this visit. Current Outpatient Medications  Medication Sig Dispense Refill   albuterol  (VENTOLIN  HFA) 108 (90 Base) MCG/ACT inhaler Inhale 1 puff into the lungs every 6 (six) hours as needed for wheezing or shortness of breath. (Patient not taking: Reported on 11/15/2023)     amphetamine-dextroamphetamine (ADDERALL) 10 MG tablet Take 10 mg by mouth daily. (Patient not taking: Reported on 07/29/2021)     amphetamine-dextroamphetamine (ADDERALL) 15 MG tablet dextroamphetamine-amphetamine 15 mg tablet  TAKE 1 TABLET BY MOUTH EVERY DAY (Patient not taking: Reported on 11/15/2023)     diazepam (VALIUM) 5 MG tablet Take 5 mg by mouth every 8 (eight) hours as needed. (Patient not taking: Reported on 11/15/2023)     diazepam (VALIUM) 5 MG tablet diazepam 5 mg tablet  Take 1 tablet every day by oral route at bedtime. (Patient not taking: Reported on 07/29/2021)     Erenumab -aooe (AIMOVIG ) 140 MG/ML SOAJ Inject 140 mg into the skin every 28 (twenty-eight) days. (Patient not taking: Reported on 07/29/2021) 1.12 mL 5   FLUoxetine (PROZAC) 20 MG capsule Take 20 mg by mouth daily. Take 3 caps daily (Patient not taking: Reported on 07/29/2021)     FLUoxetine (PROZAC) 40 MG capsule fluoxetine 40 mg  capsule  TAKE 2 CAPSULES BY MOUTH EVERY DAY (Patient not taking: Reported on 11/15/2023)     ondansetron  (ZOFRAN ) 4 MG tablet Take 1 tablet (4 mg total) by mouth every 8 (eight) hours as needed for nausea or vomiting. (Patient not taking: Reported on 11/15/2023) 20 tablet 5   OVER THE COUNTER MEDICATION Take 4 tablets by mouth daily. balance (Patient not taking: Reported on 11/15/2023)     No current facility-administered medications for this visit.    Allergies: is allergic to toradol  [ketorolac  tromethamine ] and fish allergy.  Physical Exam:  BP 109/65   Pulse 77   LMP 09/05/2023 (Exact Date)  There is no height or weight on file to calculate BMI. General appearance: Well nourished, well developed female in no acute distress.  Neuro/Psych:  Normal mood and affect.    Radiology: as per HPI  Assessment: patient stable  Plan:  1. Maternal care for fetal bradycardia during pregnancy (Primary) MAU precautions given. Recommend 1wk repeat u/s and follow up visit  Tyler Gallant MD Attending Center for [facility]

## 2023-11-21 ENCOUNTER — Other Ambulatory Visit: Payer: Self-pay

## 2023-11-21 DIAGNOSIS — O3680X0 Pregnancy with inconclusive fetal viability, not applicable or unspecified: Secondary | ICD-10-CM

## 2023-11-22 ENCOUNTER — Ambulatory Visit: Admitting: Obstetrics and Gynecology

## 2023-11-22 ENCOUNTER — Other Ambulatory Visit: Payer: Self-pay

## 2023-11-22 ENCOUNTER — Telehealth: Payer: Self-pay

## 2023-11-22 ENCOUNTER — Ambulatory Visit: Payer: Self-pay | Admitting: Obstetrics and Gynecology

## 2023-11-22 ENCOUNTER — Ambulatory Visit

## 2023-11-22 ENCOUNTER — Telehealth: Payer: Self-pay | Admitting: Family Medicine

## 2023-11-22 DIAGNOSIS — O3680X0 Pregnancy with inconclusive fetal viability, not applicable or unspecified: Secondary | ICD-10-CM

## 2023-11-22 DIAGNOSIS — O021 Missed abortion: Secondary | ICD-10-CM | POA: Diagnosis not present

## 2023-11-22 DIAGNOSIS — Z3A01 Less than 8 weeks gestation of pregnancy: Secondary | ICD-10-CM

## 2023-11-22 NOTE — Progress Notes (Signed)
  CC: missed miscarriage Subjective:    Patient ID: Melanie Galloway, female    DOB: 1990/07/23, 33 y.o.   MRN: [id number]  HPI 33 yo G3P1021 seen today for dating/viability scan.  7 week fetus seen with no fetal heart motion.  Pt is appropriately tearful.  Discussed treatment options included expectant management, medical management or surgical management.   Review of Systems     Objective:   Physical Exam There were no vitals filed for this visit. Nonviable 7 week IUP      Assessment & Plan:   1. Missed abortion (Primary) Discussed options with patient and spouse. Discussed expectant management, medical management and surgical management.  Pt informed with surgical management, ANORA genetic testing could be performed.  Pt would like to pursue expectant management for now. Will follow up in 2 weeks to reassess if expectant management was successful.  Pt may need limited u/s. Miscarriage precautions given as well as need of MAU assessment if pt is uncomfortable with pain or bleeding.  Once this pregnancy is completed, she may considered recurrent pregnancy loss panel for further eval.  I spent 10 minutes dedicated to the care of this patient including previsit review of records, face to face time with the patient discussing treatment options and post visit testing.     Abigail Abler, MD Faculty Attending, Center for [facility]

## 2023-11-22 NOTE — Telephone Encounter (Signed)
 Good Morning,   This patient is needing a MD follow up for her US  appointment that is scheduled here today.

## 2023-11-22 NOTE — Telephone Encounter (Signed)
 RN called radiology department to request ultrasound report be read and results called to Dr. Shira Dopp.  Evelia Hipp, RN

## 2023-11-29 ENCOUNTER — Encounter: Admitting: Obstetrics & Gynecology

## 2023-12-06 ENCOUNTER — Other Ambulatory Visit: Payer: Self-pay

## 2023-12-06 DIAGNOSIS — O3680X0 Pregnancy with inconclusive fetal viability, not applicable or unspecified: Secondary | ICD-10-CM

## 2023-12-07 ENCOUNTER — Ambulatory Visit (INDEPENDENT_AMBULATORY_CARE_PROVIDER_SITE_OTHER): Payer: Self-pay | Admitting: Obstetrics & Gynecology

## 2023-12-07 ENCOUNTER — Encounter: Payer: Self-pay | Admitting: Obstetrics & Gynecology

## 2023-12-07 ENCOUNTER — Other Ambulatory Visit

## 2023-12-07 ENCOUNTER — Other Ambulatory Visit: Payer: Self-pay

## 2023-12-07 VITALS — BP 111/48 | HR 105

## 2023-12-07 DIAGNOSIS — O021 Missed abortion: Secondary | ICD-10-CM

## 2023-12-07 DIAGNOSIS — Z3A01 Less than 8 weeks gestation of pregnancy: Secondary | ICD-10-CM | POA: Diagnosis not present

## 2023-12-07 DIAGNOSIS — O3680X0 Pregnancy with inconclusive fetal viability, not applicable or unspecified: Secondary | ICD-10-CM

## 2023-12-07 MED ORDER — MISOPROSTOL 200 MCG PO TABS: ORAL_TABLET | ORAL | 1 refills | Status: DC

## 2023-12-07 MED ORDER — IBUPROFEN 600 MG PO TABS: 600.0000 mg | ORAL_TABLET | Freq: Four times a day (QID) | ORAL | 3 refills | Status: AC | PRN

## 2023-12-07 NOTE — Progress Notes (Unsigned)
 Ultrasounds Results Note  SUBJECTIVE HPI:  Ms. Melanie Galloway is a 33 y.o. G3P1011 at Unknown by LMP who presents to the [facility] for followup missed abortion 7 weeks. The patient has abdominal pain or vaginal bleeding.  Missed abortion dx 6/16 by US , bleeding started yesterday with cramping Past Medical History:  Diagnosis Date   Asthma    Migraines    Stomach ulcer    TBI (traumatic brain injury) (HCC)    Past Surgical History:  Procedure Laterality Date   EXTERNAL EAR SURGERY     EYE MUSCLE SURGERY     EYE SURGERY     left    Social History   Socioeconomic History   Marital status: Married    Spouse name: Not on file   Number of children: Not on file   Years of education: Not on file   Highest education level: Not on file  Occupational History   Not on file  Tobacco Use   Smoking status: Former    Current packs/day: 0.00    Types: Cigarettes    Quit date: 06/2017    Years since quitting: 6.5   Smokeless tobacco: Never  Vaping Use   Vaping status: Never Used  Substance and Sexual Activity   Alcohol  use: Yes    Comment: Socially   Drug use: Not Currently    Types: Marijuana    Comment: 2014   Sexual activity: Yes    Birth control/protection: None  Other Topics Concern   Not on file  Social History Narrative   Right handed   No caffeine    One story home   Social Drivers of Health   Financial Resource Strain: Unknown (03/25/2021)   Received from [facility]   Overall Financial Resource Strain (CARDIA)    Difficulty of Paying Living Expenses: Patient declined  Food Insecurity: Unknown (03/25/2021)   Received from [facility]   Hunger Vital Sign    Within the past 12 months, you worried that your food would run out before you got the money to buy more.: Patient declined    Within the past 12 months, the food you bought just didn't last and you didn't have money to get more.: Patient declined  Transportation Needs: Unknown  (03/25/2021)   Received from [facility] - Transportation    Lack of Transportation (Medical): Patient declined    Lack of Transportation (Non-Medical): Patient declined  Physical Activity: Unknown (03/25/2021)   Received from [facility]   Exercise Vital Sign    On average, how many days per week do you engage in moderate to strenuous exercise (like a brisk walk)?: Patient declined    On average, how many minutes do you engage in exercise at this level?: 10 min  Stress: Stress Concern Present (03/25/2021)   Received from [facility] - Occupational Stress Questionnaire    Feeling of Stress : Very much  Social Connections: Unknown (10/09/2021)   Received from [facility]   Social Network    Social Network: Not on file  Intimate Partner Violence: Not At Risk (09/19/2023)   Received from [facility]   HITS    Over the last 12 months how often did your partner physically hurt you?: Never    Over the last 12 months how often did your partner insult you or talk down to you?: Never    Over the last 12 months how often did your partner threaten  you with physical harm?: Never    Over the last 12 months how often did your partner scream or curse at you?: Never   Current Outpatient Medications on File Prior to Visit  Medication Sig Dispense Refill   albuterol  (VENTOLIN  HFA) 108 (90 Base) MCG/ACT inhaler Inhale 1 puff into the lungs every 6 (six) hours as needed for wheezing or shortness of breath. (Patient not taking: Reported on 11/15/2023)     amphetamine-dextroamphetamine (ADDERALL) 10 MG tablet Take 10 mg by mouth daily. (Patient not taking: Reported on 07/29/2021)     amphetamine-dextroamphetamine (ADDERALL) 15 MG tablet dextroamphetamine-amphetamine 15 mg tablet  TAKE 1 TABLET BY MOUTH EVERY DAY (Patient not taking: Reported on 11/15/2023)     diazepam (VALIUM) 5 MG tablet Take 5 mg by mouth every 8 (eight) hours as needed. (Patient  not taking: Reported on 11/15/2023)     diazepam (VALIUM) 5 MG tablet diazepam 5 mg tablet  Take 1 tablet every day by oral route at bedtime. (Patient not taking: Reported on 07/29/2021)     Erenumab -aooe (AIMOVIG ) 140 MG/ML SOAJ Inject 140 mg into the skin every 28 (twenty-eight) days. (Patient not taking: Reported on 07/29/2021) 1.12 mL 5   FLUoxetine (PROZAC) 20 MG capsule Take 20 mg by mouth daily. Take 3 caps daily (Patient not taking: Reported on 07/29/2021)     FLUoxetine (PROZAC) 40 MG capsule fluoxetine 40 mg capsule  TAKE 2 CAPSULES BY MOUTH EVERY DAY (Patient not taking: Reported on 11/15/2023)     ondansetron  (ZOFRAN ) 4 MG tablet Take 1 tablet (4 mg total) by mouth every 8 (eight) hours as needed for nausea or vomiting. (Patient not taking: Reported on 11/15/2023) 20 tablet 5   OVER THE COUNTER MEDICATION Take 4 tablets by mouth daily. balance (Patient not taking: Reported on 11/15/2023)     No current facility-administered medications on file prior to visit.   Allergies  Allergen Reactions   Toradol  [Ketorolac  Tromethamine ] Anaphylaxis and Hives   Fish Allergy Hives    Grouper caused hives    I have reviewed patient's Past Medical Hx, Surgical Hx, Family Hx, Social Hx, medications and allergies.   Review of Systems Review of Systems  Constitutional: Negative for fever and chills.  Gastrointestinal: Negative for nausea, vomiting, abdominal pain, diarrhea and constipation.  Genitourinary: Negative for dysuria.  Musculoskeletal: Negative for back pain.  Neurological: Negative for dizziness and weakness.    Physical Exam  BP (!) 111/48   Pulse (!) 105   LMP 09/05/2023 (Exact Date)   GENERAL: Well-developed, well-nourished female in no acute distress.  HEENT: Normocephalic, atraumatic.   LUNGS: Effort normal ABDOMEN: soft, non-tender HEART: Regular rate  SKIN: Warm, dry and without erythema PSYCH: Normal mood and affect NEURO: Alert and oriented x 4  LAB RESULTS No results  found for this or any previous visit (from the past 24 hours).  IMAGING US  OB Transvaginal Result Date: 11/26/2023 ----------------------------------------------------------------------  OBSTETRICS REPORT                       (Signed Final 11/26/2023 10:54 am) ---------------------------------------------------------------------- Patient Info  ID #:       [id number]                          D.O.B.:  1990/06/17 (32 yrs)(F)  Name:       Melanie Galloway  Visit Date: 11/22/2023 02:00 pm ---------------------------------------------------------------------- Performed By  Attending:        Jerilynn Buddle MD       Ref. Address:     [address]                                                             [place], [place]                                                             [address]  Performed By:     Scarlet Flesher         Location:         [facility]                                     [facility]  Referred By:      [facility] ---------------------------------------------------------------------- Orders  #  Description                           Code        Ordered By  1  US  OB TRANSVAGINAL                    [id number]     BEBE FURRY ----------------------------------------------------------------------  #  Order #                     Accession #                Episode #  1  [id number]  [id number]                 [id number] ---------------------------------------------------------------------- Indications  Weeks of gestation of pregnancy not            Z3A.00  specified  Pregnancy with inconclusive fetal viability    O36.80X0 ---------------------------------------------------------------------- Fetal Evaluation  Num Of  Fetuses:         1  Gest. Sac:              Intrauterine  Yolk Sac:               Visualized  Fetal Pole:             Visualized  Cardiac Activity:       Not visualized  Comment:    FP meas [redacted]w[redacted]d, same as prior, with NO FHTs seen today. FHTs              on prior was 49bpm, nonviable IUP noted ---------------------------------------------------------------------- 1st Trimester Genetic Sonogram Screening  CRL:            10.7  mm    G. Age:   7w 1d                  EDD:   07/09/24 ----------------------------------------------------------------------                 Jerilynn Buddle, MD Electronically Signed Final Report   11/26/2023 10:54 am ----------------------------------------------------------------------   US  OB LESS THAN 14 WEEKS WITH OB TRANSVAGINAL Result Date: 11/23/2023 ----------------------------------------------------------------------  OBSTETRICS REPORT                       (Signed Final 11/23/2023 02:08 pm) ---------------------------------------------------------------------- Patient Info  ID #:       [id number]                          D.O.B.:  1991/03/16 (32 yrs)(F)  Name:       Melanie Galloway               Visit Date: 11/15/2023 10:47 am ---------------------------------------------------------------------- Performed By  Attending:        Steffan Rover MD     Ref. Address:     [address]                                                             [place], [place]                                                             [address]  Performed By:     Scarlet Flesher         Location:         [facility] for                    [facility]                                                             [facility]care at                                                             [employer] [facility] for                                                             Women  Referred By:      [facility]  ---------------------------------------------------------------------- Orders  #  Description                           Code        Ordered By  1  US  OB LESS THAN 14 WEEKS              [id number]     BURNARD PATE     WITH OB TRANSVAGINAL ----------------------------------------------------------------------  #  Order #                     Accession #                Episode #  1  [id number]                   [id number]                 [id number] ---------------------------------------------------------------------- Indications  Weeks of gestation of pregnancy not            Z3A.00  specified  Encounter for uncertain dates                  Z36.87  Pregnancy with inconclusive fetal viability    O36.80X0 ---------------------------------------------------------------------- Fetal Evaluation  Num Of Fetuses:         1  Preg. Location:         Intrauterine  Gest. Sac:              Intrauterine  Yolk Sac:               Visualized  Fetal Pole:             Visualized  Fetal Heart Rate(bpm):  49  Cardiac Activity:       Bradycardia  Comment:    [redacted]w[redacted]d FP w/ HR of 49. Small subchorionic hemorrhage noted.              LMP per pt 3/20 ---------------------------------------------------------------------- Biophysical Evaluation  Amniotic F.V:   Within normal limits       Score:          4/4  F. Movement:    Observed ----------------------------------------------------------------------  1st Trimester Genetic Sonogram Screening  CRL:             9.9  mm    G. Age:   7w 1d                  EDD:   07/02/24 ---------------------------------------------------------------------- Impression  Patient stable, IUP measuring [redacted]w[redacted]d with fetal bradycardia of  49 bpm. ---------------------------------------------------------------------- Recommendations  Repeat ultrasound with MD visit in one week. ED precautions  given. Normal ovaries bilaterally with small 2cm simple cyst  on left ovary.  ----------------------------------------------------------------------                Steffan Rover, MD Electronically Signed Final Report   11/23/2023 02:08 pm ----------------------------------------------------------------------    ASSESSMENT 1. Missed abortion     PLAN Meds ordered this encounter  Medications   misoprostol (CYTOTEC) 200 MCG tablet    Sig: Place 2 tablets in between your gums and cheeks (two tablets on each side) as instructed and insert 2 tablets vaginally    Dispense:  4 tablet    Refill:  1   ibuprofen  (ADVIL ) 600 MG tablet    Sig: Take 1 tablet (600 mg total) by mouth every 6 (six) hours as needed.    Dispense:  60 tablet    Refill:  3  RTC for f/u Eveline Lynwood MATSU, MD  12/07/2023  9:07 AM

## 2023-12-21 ENCOUNTER — Ambulatory Visit: Admitting: Obstetrics and Gynecology

## 2024-01-12 ENCOUNTER — Other Ambulatory Visit: Payer: Self-pay

## 2024-01-12 ENCOUNTER — Inpatient Hospital Stay (HOSPITAL_COMMUNITY)
Admission: AD | Admit: 2024-01-12 | Discharge: 2024-01-12 | Disposition: A | Discharge status: Home / Self Care | Attending: Obstetrics and Gynecology | Admitting: Obstetrics and Gynecology

## 2024-01-12 ENCOUNTER — Encounter (HOSPITAL_COMMUNITY): Payer: Self-pay | Admitting: Obstetrics and Gynecology

## 2024-01-12 DIAGNOSIS — Z789 Other specified health status: Secondary | ICD-10-CM

## 2024-01-12 DIAGNOSIS — N920 Excessive and frequent menstruation with regular cycle: Secondary | ICD-10-CM | POA: Diagnosis present

## 2024-01-12 LAB — CBC
HCT: 40.5 % (ref 36.0–46.0)
Hemoglobin: 13.7 g/dL (ref 12.0–15.0)
MCH: 30.3 pg (ref 26.0–34.0)
MCHC: 33.8 g/dL (ref 30.0–36.0)
MCV: 89.6 fL (ref 80.0–100.0)
Platelets: 259 K/uL (ref 150–400)
RBC: 4.52 MIL/uL (ref 3.87–5.11)
RDW: 12.5 % (ref 11.5–15.5)
WBC: 5 K/uL (ref 4.0–10.5)
nRBC: 0 % (ref 0.0–0.2)

## 2024-01-12 LAB — HCG, QUANTITATIVE, PREGNANCY: hCG, Beta Chain, Quant, S: 1 m[IU]/mL (ref <5)

## 2024-01-12 NOTE — Discharge Instructions (Signed)
 Go to [facility] Emergency Department: If you have heavier bleeding that soaks through more that 2 pads per hour for an hour or more If you bleed so much that you feel like you might pass out or you do pass out If you have significant abdominal pain that is not improved with Tylenol  1000 mg every 8 hours as needed for pain If you develop a fever > 100.5

## 2024-01-12 NOTE — MAU Provider Note (Signed)
 History     CSN: [id number]  Arrival date and time: 01/12/24 1625   Event Date/Time   First Provider Initiated Contact with Patient 01/12/24 1744      Chief Complaint  Patient presents with   Vaginal Bleeding   HPI Ms. Melanie Galloway is a 33 y.o. year old [id number] female  who is non-pregnant presents to MAU reporting she had a SAB on 7/12025. She found bled from 7/1 until 7/15. She reports heavy vaginal bleeding started yesterday (01/11/2024). She soaked through 2 over night pads in 2 hours. The VB soaked through to her underwear x 2. She also reports feeling dizzy today and decided to come here. She states, I feel weak; like jello. Her pregnancy was confirmed at [facility] on 6/9; where she found out she was [redacted] weeks pregnant. She states she went back the next week for a viability U/S and found out there was no fetal heartbeat. She was offered expectant management and to take the abortion pill. She states, I didn't want to have an abortion, so I chose expectant management. She plans to receives follow-up care with [facility]; next appt is 02/01/2024.   OB History     Gravida  3   Para  1   Term  1   Preterm      AB  1   Living  1      SAB  1   IAB      Ectopic      Multiple      Live Births  1           Past Medical History:  Diagnosis Date   Asthma    Migraines    Stomach ulcer    TBI (traumatic brain injury) (HCC)     Past Surgical History:  Procedure Laterality Date   EXTERNAL EAR SURGERY     EYE MUSCLE SURGERY     EYE SURGERY     left     Family History  Problem Relation Age of Onset   Cancer Mother    Crohn's disease Mother    Kidney Stones Mother    Asthma Sister     Social History   Tobacco Use   Smoking status: Former    Current packs/day: 0.00    Types: Cigarettes    Quit date: 06/2017    Years since quitting: 6.6   Smokeless tobacco: Never  Vaping Use   Vaping status: Never Used  Substance Use Topics   Alcohol   use: Yes    Comment: Socially   Drug use: Not Currently    Types: Marijuana    Comment: 2014    Allergies:  Allergies  Allergen Reactions   Toradol  [Ketorolac  Tromethamine ] Anaphylaxis and Hives   Fish Allergy Hives    Grouper caused hives    Medications Prior to Admission  Medication Sig Dispense Refill Last Dose/Taking   albuterol  (VENTOLIN  HFA) 108 (90 Base) MCG/ACT inhaler Inhale 1 puff into the lungs every 6 (six) hours as needed for wheezing or shortness of breath. (Patient not taking: Reported on 11/15/2023)      amphetamine-dextroamphetamine (ADDERALL) 10 MG tablet Take 10 mg by mouth daily. (Patient not taking: Reported on 07/29/2021)      amphetamine-dextroamphetamine (ADDERALL) 15 MG tablet dextroamphetamine-amphetamine 15 mg tablet  TAKE 1 TABLET BY MOUTH EVERY DAY (Patient not taking: Reported on 11/15/2023)      diazepam (VALIUM) 5 MG tablet Take 5 mg by mouth every 8 (eight)  hours as needed. (Patient not taking: Reported on 11/15/2023)      diazepam (VALIUM) 5 MG tablet diazepam 5 mg tablet  Take 1 tablet every day by oral route at bedtime. (Patient not taking: Reported on 07/29/2021)      Erenumab -aooe (AIMOVIG ) 140 MG/ML SOAJ Inject 140 mg into the skin every 28 (twenty-eight) days. (Patient not taking: Reported on 07/29/2021) 1.12 mL 5    FLUoxetine (PROZAC) 20 MG capsule Take 20 mg by mouth daily. Take 3 caps daily (Patient not taking: Reported on 07/29/2021)      FLUoxetine (PROZAC) 40 MG capsule fluoxetine 40 mg capsule  TAKE 2 CAPSULES BY MOUTH EVERY DAY (Patient not taking: Reported on 11/15/2023)      ibuprofen  (ADVIL ) 600 MG tablet Take 1 tablet (600 mg total) by mouth every 6 (six) hours as needed. 60 tablet 3    misoprostol  (CYTOTEC ) 200 MCG tablet Place 2 tablets in between your gums and cheeks (two tablets on each side) as instructed and insert 2 tablets vaginally 4 tablet 1    ondansetron  (ZOFRAN ) 4 MG tablet Take 1 tablet (4 mg total) by mouth every 8 (eight) hours as  needed for nausea or vomiting. (Patient not taking: Reported on 11/15/2023) 20 tablet 5    OVER THE COUNTER MEDICATION Take 4 tablets by mouth daily. balance (Patient not taking: Reported on 11/15/2023)       Review of Systems  Constitutional: Negative.   HENT: Negative.    Eyes: Negative.   Respiratory: Negative.    Cardiovascular: Negative.   Gastrointestinal: Negative.   Endocrine: Negative.   Genitourinary:  Positive for vaginal bleeding (still heavy, but not saoking through to underwear now).  Musculoskeletal: Negative.   Skin: Negative.   Allergic/Immunologic: Negative.   Neurological: Negative.   Hematological: Negative.   Psychiatric/Behavioral: Negative.     Physical Exam   Blood pressure 112/77, pulse 88, temperature 98.9 F (37.2 C), temperature source Oral, resp. rate 16, height 5' 4 (1.626 m), weight 53.1 kg, last menstrual period 09/05/2023, SpO2 100%.  Physical Exam Vitals and nursing note reviewed.  Constitutional:      Appearance: Normal appearance. She is normal weight.  Cardiovascular:     Rate and Rhythm: Normal rate.  Pulmonary:     Effort: Pulmonary effort is normal.  Genitourinary:    Comments: deferred Musculoskeletal:        General: Normal range of motion.  Skin:    General: Skin is warm and dry.  Neurological:     Mental Status: She is alert and oriented to person, place, and time.  Psychiatric:        Attention and Perception: Attention and perception normal.        Mood and Affect: Affect normal. Mood is anxious.        Speech: Speech normal.        Behavior: Behavior normal. Behavior is cooperative.        Thought Content: Thought content normal.        Cognition and Memory: Cognition and memory normal.    MAU Course  Procedures  MDM CBC HCG  Results for orders placed or performed during the hospital encounter of 01/12/24 (from the past 24 hours)  CBC     Status: None   Collection Time: 01/12/24  5:45 PM  Result Value Ref Range    WBC 5.0 4.0 - 10.5 K/uL   RBC 4.52 3.87 - 5.11 MIL/uL   Hemoglobin 13.7 12.0 - 15.0 g/dL  HCT 40.5 36.0 - 46.0 %   MCV 89.6 80.0 - 100.0 fL   MCH 30.3 26.0 - 34.0 pg   MCHC 33.8 30.0 - 36.0 g/dL   RDW 87.4 88.4 - 84.4 %   Platelets 259 150 - 400 K/uL   nRBC 0.0 0.0 - 0.2 %  hCG, quantitative, pregnancy     Status: None   Collection Time: 01/12/24  5:45 PM  Result Value Ref Range   hCG, Beta Chain, Quant, S 1 <5 mIU/mL     Assessment and Plan  1. Menorrhagia with regular cycle (Primary) Comments: 1st after SAB  2. Not currently pregnant - Advised this is the 1st period after SAB - Reassurance given that the 1st period after a SAB or childbirth can be heavier than normal - Discharge home  - Keep scheduled appt with [facility] on 02/01/2024 - Patient verbalized an understanding of the plan of care and agrees.   Emiel Kielty, CNM 01/12/2024, 7:25 PM

## 2024-01-12 NOTE — MAU Note (Signed)
 Melanie Galloway is a 33 y.o. at Unknown here in MAU reporting: miscarriage on July 1 and had bleeding thru 15th. Yesterday bleeding started back heavy 2 overnight pads in 2 hours. Reports being dizzy today. Pt denies thinking she is pregnant.   LMP: 7/1 Onset of complaint: 8/5 Pain score: 0/10 Vitals:   01/12/24 1649  BP: 112/77  Pulse: 88  Resp: 16  Temp: 98.9 F (37.2 C)  SpO2: 100%     FHT: na  Lab orders placed from triage: na

## 2024-02-01 ENCOUNTER — Ambulatory Visit: Admitting: Family Medicine
